# Patient Record
Sex: Female | Born: 1970
Health system: Southern US, Community
[De-identification: ages and names within clinical notes are randomized; demographics above are authoritative.]

## PROBLEM LIST (undated history)

## (undated) DIAGNOSIS — M1990 Unspecified osteoarthritis, unspecified site: Secondary | ICD-10-CM

## (undated) DIAGNOSIS — I10 Essential (primary) hypertension: Secondary | ICD-10-CM

## (undated) DIAGNOSIS — K219 Gastro-esophageal reflux disease without esophagitis: Secondary | ICD-10-CM

## (undated) DIAGNOSIS — J45909 Unspecified asthma, uncomplicated: Secondary | ICD-10-CM

## (undated) DIAGNOSIS — G5600 Carpal tunnel syndrome, unspecified upper limb: Secondary | ICD-10-CM

## (undated) DIAGNOSIS — E785 Hyperlipidemia, unspecified: Secondary | ICD-10-CM

## (undated) HISTORY — PX: BREAST SURGERY: SHX581

## (undated) HISTORY — PX: CARPAL TUNNEL RELEASE: SHX101

## (undated) HISTORY — PX: CHOLECYSTECTOMY: SHX55

## (undated) HISTORY — PX: TUBAL LIGATION: SHX77

## (undated) HISTORY — DX: Essential (primary) hypertension: I10

## (undated) HISTORY — DX: Unspecified osteoarthritis, unspecified site: M19.90

---
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
## 2018-04-07 ENCOUNTER — Emergency Department (HOSPITAL_COMMUNITY)
Admission: EM | Admit: 2018-04-07 | Discharge: 2018-04-07 | Disposition: A | Discharge status: Home / Self Care | Payer: Medicaid Other | Attending: Emergency Medicine | Admitting: Emergency Medicine

## 2018-04-07 ENCOUNTER — Emergency Department (HOSPITAL_COMMUNITY): Payer: Medicaid Other

## 2018-04-07 ENCOUNTER — Encounter (HOSPITAL_COMMUNITY): Payer: Self-pay | Admitting: Emergency Medicine

## 2018-04-07 DIAGNOSIS — J45909 Unspecified asthma, uncomplicated: Secondary | ICD-10-CM | POA: Diagnosis not present

## 2018-04-07 DIAGNOSIS — M545 Low back pain, unspecified: Secondary | ICD-10-CM

## 2018-04-07 DIAGNOSIS — Y998 Other external cause status: Secondary | ICD-10-CM | POA: Insufficient documentation

## 2018-04-07 DIAGNOSIS — Y9301 Activity, walking, marching and hiking: Secondary | ICD-10-CM | POA: Insufficient documentation

## 2018-04-07 DIAGNOSIS — Y92009 Unspecified place in unspecified non-institutional (private) residence as the place of occurrence of the external cause: Secondary | ICD-10-CM

## 2018-04-07 DIAGNOSIS — M79609 Pain in unspecified limb: Secondary | ICD-10-CM | POA: Insufficient documentation

## 2018-04-07 DIAGNOSIS — S8992XA Unspecified injury of left lower leg, initial encounter: Secondary | ICD-10-CM | POA: Diagnosis present

## 2018-04-07 DIAGNOSIS — Y92019 Unspecified place in single-family (private) house as the place of occurrence of the external cause: Secondary | ICD-10-CM | POA: Diagnosis not present

## 2018-04-07 DIAGNOSIS — F17210 Nicotine dependence, cigarettes, uncomplicated: Secondary | ICD-10-CM | POA: Insufficient documentation

## 2018-04-07 DIAGNOSIS — S8011XA Contusion of right lower leg, initial encounter: Secondary | ICD-10-CM | POA: Insufficient documentation

## 2018-04-07 DIAGNOSIS — S8002XA Contusion of left knee, initial encounter: Secondary | ICD-10-CM | POA: Diagnosis not present

## 2018-04-07 DIAGNOSIS — W19XXXA Unspecified fall, initial encounter: Secondary | ICD-10-CM | POA: Insufficient documentation

## 2018-04-07 HISTORY — DX: Carpal tunnel syndrome, unspecified upper limb: G56.00

## 2018-04-07 HISTORY — DX: Unspecified osteoarthritis, unspecified site: M19.90

## 2018-04-07 HISTORY — DX: Unspecified asthma, uncomplicated: J45.909

## 2018-04-07 NOTE — ED Provider Notes (Signed)
[facility] EMERGENCY DEPARTMENT Provider Note   CSN: [id number] Arrival date & time: 04/07/18  2133     History   Chief Complaint Chief Complaint  Patient presents with  . Fall    HPI Judy Baker is a 47 y.o. female.  Patient with mechanical fall yesterday. She reports her slippers caught on a step at home, with patient falling forward. She struck her knees on the floor, and caught herself with her hands and arms. She is complaining of generalized muscle aches of upper arms and her lower back. Pain in right shin and left knee. She did not hit her head or lose consciousness.   The history is provided by the patient. No language interpreter was used.  Fall  This is a new problem. The current episode started yesterday. The problem has been gradually worsening. She has tried acetaminophen for the symptoms. The treatment provided mild relief.    Past Medical History:  Diagnosis Date  . Arthritis   . Asthma   . Carpal tunnel syndrome     There are no active problems to display for this patient.   Past Surgical History:  Procedure Laterality Date  . CHOLECYSTECTOMY    . TUBAL LIGATION       OB History   None      Home Medications    Prior to Admission medications   Not on File    Family History No family history on file.  Social History Social History   Tobacco Use  . Smoking status: Current Every Day Smoker    Packs/day: 0.50    Types: Cigarettes  . Smokeless tobacco: Never Used  Substance Use Topics  . Alcohol use: Not Currently  . Drug use: Not Currently     Allergies   Penicillins and Sulfa antibiotics   Review of Systems Review of Systems  Musculoskeletal: Positive for arthralgias, back pain and myalgias.  Skin: Negative for wound.  All other systems reviewed and are negative.    Physical Exam Updated Vital Signs BP (!) 145/68 (BP Location: Right Arm)   Pulse 81   Temp 99.7 F (37.6 C)   Resp 20   Ht 5\' 7"   (1.702 m)   Wt (!) 142 kg   SpO2 94%   BMI 49.02 kg/m   Physical Exam  Constitutional: She is oriented to person, place, and time. She appears well-developed and well-nourished.  HENT:  Head: Atraumatic.  Eyes: Conjunctivae are normal.  Neck: Neck supple.  Cardiovascular: Normal rate and regular rhythm.  Pulmonary/Chest: Effort normal and breath sounds normal.  Abdominal: Soft. Bowel sounds are normal.  Musculoskeletal: Normal range of motion. She exhibits tenderness.       Right wrist: She exhibits tenderness. She exhibits normal range of motion, no bony tenderness, no swelling and no deformity.       Left wrist: She exhibits tenderness. She exhibits normal range of motion, no swelling and no deformity.       Left knee: Tenderness found.       Back:       Arms:      Legs: Neurological: She is alert and oriented to person, place, and time.  Skin: Skin is warm and dry.  Psychiatric: She has a normal mood and affect.  Nursing note and vitals reviewed.    ED Treatments / Results  Labs (all labs ordered are listed, but only abnormal results are displayed) Labs Reviewed - No data to display  EKG None  Radiology Dg Knee Complete 4 Views Left  Result Date: 04/07/2018 CLINICAL DATA:  Fall at home.  Medial left knee pain and bruising. EXAM: LEFT KNEE - COMPLETE 4+ VIEW COMPARISON:  None. FINDINGS: Osteoarthritis with loss of articular space especially in the medial compartment and patellofemoral joint, and tricompartmental spurring. Overall degree of osteoarthritis is moderate to prominent. Mild subcortical sclerosis in the medial compartment. No appreciable knee effusion or fracture. No acute bony findings are identified. IMPRESSION: 1. Moderate to prominent osteoarthritis of the knee. No definite knee effusion or fracture. If pain persists despite conservative therapy, MRI may be warranted for further characterization. Electronically Signed   By: Gaylyn Rong M.D.   On:  04/07/2018 22:44    Procedures Procedures (including critical care time)  Medications Ordered in ED Medications - No data to display   Initial Impression / Assessment and Plan / ED Course  I have reviewed the triage vital signs and the nursing notes.  Pertinent labs & imaging results that were available during my care of the patient were reviewed by me and considered in my medical decision making (see chart for details).     Patient X-Ray negative for obvious fracture or dislocation. Patient is aware of osteoarthritis findings. Patient given ace wrap while in ED, conservative therapy recommended and discussed.  Patient with muscular back pain.  No neurological deficits and normal neuro exam.  Patient is ambulatory.   No urinary symptoms suggestive of UTI.  Patient will be discharged home & is agreeable with above plan. Returns precautions discussed. Pt appears safe for discharge.   Final Clinical Impressions(s) / ED Diagnoses   Final diagnoses:  Fall in home, initial encounter  Contusion of left knee, initial encounter  Contusion of right lower leg, initial encounter  Low back pain at multiple sites  Pain in extremity at multiple sites    ED Discharge Orders    None       Felicie Morn, NP 04/07/18 1610    Linwood Dibbles, MD 04/08/18 [phone]

## 2018-04-07 NOTE — ED Notes (Signed)
Patient transported to X-ray 

## 2018-04-07 NOTE — Discharge Instructions (Addendum)
Take tylenol, 1000 mg (two extra strength, 500 mg tablets) along with 400-600 mg ibuprofen (2-3 200 mg tablets) every 8 hours for pain. Take with food. Follow-up with your primary care provider as scheduled next week.

## 2018-04-07 NOTE — ED Triage Notes (Signed)
Pt reports mechanical fall yesterday, reports she was wearing slippers and tripped over a step at home. No LOC. Reports gabapentin, tylenol and muscle relaxers not effective for pain

## 2018-04-07 NOTE — ED Notes (Addendum)
ED Provider at bedside. See EDP assessment.  

## 2018-04-07 NOTE — ED Notes (Signed)
Fall armband placed on patient.

## 2018-12-04 ENCOUNTER — Other Ambulatory Visit: Payer: Self-pay | Admitting: Family Medicine

## 2018-12-04 ENCOUNTER — Other Ambulatory Visit: Payer: Self-pay | Admitting: *Deleted

## 2018-12-04 DIAGNOSIS — N6310 Unspecified lump in the right breast, unspecified quadrant: Secondary | ICD-10-CM

## 2018-12-17 ENCOUNTER — Other Ambulatory Visit: Payer: Self-pay | Admitting: Family Medicine

## 2018-12-17 ENCOUNTER — Ambulatory Visit
Admission: RE | Admit: 2018-12-17 | Discharge: 2018-12-17 | Disposition: A | Discharge status: Home / Self Care | Payer: Medicaid Other | Source: Ambulatory Visit | Attending: Family Medicine | Admitting: Family Medicine

## 2018-12-17 ENCOUNTER — Other Ambulatory Visit: Payer: Self-pay

## 2018-12-17 DIAGNOSIS — N6310 Unspecified lump in the right breast, unspecified quadrant: Secondary | ICD-10-CM

## 2018-12-21 ENCOUNTER — Ambulatory Visit
Admission: RE | Admit: 2018-12-21 | Discharge: 2018-12-21 | Disposition: A | Discharge status: Home / Self Care | Payer: Medicaid Other | Source: Ambulatory Visit | Attending: Family Medicine | Admitting: Family Medicine

## 2018-12-21 ENCOUNTER — Other Ambulatory Visit: Payer: Self-pay | Admitting: Family Medicine

## 2018-12-21 ENCOUNTER — Other Ambulatory Visit: Payer: Self-pay

## 2018-12-21 DIAGNOSIS — N6320 Unspecified lump in the left breast, unspecified quadrant: Secondary | ICD-10-CM

## 2018-12-21 DIAGNOSIS — N6310 Unspecified lump in the right breast, unspecified quadrant: Secondary | ICD-10-CM

## 2018-12-21 HISTORY — PX: BREAST BIOPSY: SHX20

## 2018-12-23 ENCOUNTER — Other Ambulatory Visit: Payer: Medicaid Other

## 2018-12-23 ENCOUNTER — Other Ambulatory Visit: Payer: Self-pay | Admitting: Family Medicine

## 2018-12-23 DIAGNOSIS — R0602 Shortness of breath: Secondary | ICD-10-CM

## 2018-12-23 DIAGNOSIS — Z8249 Family history of ischemic heart disease and other diseases of the circulatory system: Secondary | ICD-10-CM

## 2018-12-24 ENCOUNTER — Other Ambulatory Visit: Payer: Medicaid Other

## 2019-01-13 ENCOUNTER — Other Ambulatory Visit: Payer: Self-pay | Admitting: *Deleted

## 2019-01-13 DIAGNOSIS — Z20822 Contact with and (suspected) exposure to covid-19: Secondary | ICD-10-CM

## 2019-01-18 LAB — NOVEL CORONAVIRUS, NAA: SARS-CoV-2, NAA: NOT DETECTED

## 2019-01-20 ENCOUNTER — Other Ambulatory Visit: Payer: Self-pay

## 2019-01-20 ENCOUNTER — Telehealth: Payer: Self-pay | Admitting: General Practice

## 2019-01-20 ENCOUNTER — Other Ambulatory Visit: Payer: Medicaid Other

## 2019-01-20 ENCOUNTER — Ambulatory Visit (INDEPENDENT_AMBULATORY_CARE_PROVIDER_SITE_OTHER): Payer: Medicaid Other

## 2019-01-20 DIAGNOSIS — R0602 Shortness of breath: Secondary | ICD-10-CM | POA: Diagnosis not present

## 2019-01-20 DIAGNOSIS — Z8249 Family history of ischemic heart disease and other diseases of the circulatory system: Secondary | ICD-10-CM | POA: Diagnosis not present

## 2019-01-20 NOTE — Telephone Encounter (Signed)
Negative result was given to pt °

## 2019-02-03 ENCOUNTER — Other Ambulatory Visit: Payer: Self-pay

## 2019-02-03 ENCOUNTER — Ambulatory Visit: Payer: Medicaid Other | Admitting: Cardiology

## 2019-02-04 ENCOUNTER — Encounter: Payer: Self-pay | Admitting: Cardiology

## 2019-02-04 DIAGNOSIS — K219 Gastro-esophageal reflux disease without esophagitis: Secondary | ICD-10-CM | POA: Insufficient documentation

## 2019-02-04 DIAGNOSIS — I10 Essential (primary) hypertension: Secondary | ICD-10-CM

## 2019-02-04 DIAGNOSIS — E785 Hyperlipidemia, unspecified: Secondary | ICD-10-CM

## 2019-02-04 HISTORY — DX: Gastro-esophageal reflux disease without esophagitis: K21.9

## 2019-02-04 HISTORY — DX: Hyperlipidemia, unspecified: E78.5

## 2019-02-04 HISTORY — DX: Essential (primary) hypertension: I10

## 2019-02-05 ENCOUNTER — Other Ambulatory Visit: Payer: Self-pay

## 2019-02-05 ENCOUNTER — Ambulatory Visit (INDEPENDENT_AMBULATORY_CARE_PROVIDER_SITE_OTHER): Payer: Medicaid Other | Admitting: Cardiology

## 2019-02-05 ENCOUNTER — Encounter: Payer: Self-pay | Admitting: Cardiology

## 2019-02-05 VITALS — BP 134/85 | HR 78 | Ht 67.0 in | Wt 343.0 lb

## 2019-02-05 DIAGNOSIS — I209 Angina pectoris, unspecified: Secondary | ICD-10-CM

## 2019-02-05 DIAGNOSIS — E7800 Pure hypercholesterolemia, unspecified: Secondary | ICD-10-CM | POA: Diagnosis not present

## 2019-02-05 DIAGNOSIS — I10 Essential (primary) hypertension: Secondary | ICD-10-CM | POA: Diagnosis not present

## 2019-02-05 DIAGNOSIS — F17200 Nicotine dependence, unspecified, uncomplicated: Secondary | ICD-10-CM

## 2019-02-05 DIAGNOSIS — K219 Gastro-esophageal reflux disease without esophagitis: Secondary | ICD-10-CM | POA: Diagnosis not present

## 2019-02-05 MED ORDER — VARENICLINE TARTRATE 0.5 MG PO TABS: 0.5000 mg | ORAL_TABLET | Freq: Two times a day (BID) | ORAL | 1 refills | Status: DC

## 2019-02-05 MED ORDER — NITROGLYCERIN 0.4 MG SL SUBL: 0.4000 mg | SUBLINGUAL_TABLET | SUBLINGUAL | 1 refills | Status: DC | PRN

## 2019-02-05 MED ORDER — METOPROLOL SUCCINATE ER 25 MG PO TB24: 25.0000 mg | ORAL_TABLET | Freq: Every day | ORAL | 1 refills | Status: DC

## 2019-02-05 MED ORDER — ASPIRIN EC 81 MG PO TBEC: 81.0000 mg | DELAYED_RELEASE_TABLET | Freq: Every day | ORAL | 3 refills | Status: DC

## 2019-02-05 NOTE — Progress Notes (Signed)
Primary Physician:  Adalberto Ill, DO   Patient ID: Judy Baker, female    DOB: 01-23-1971, 48 y.o.   MRN: [id number]  Subjective:    Chief Complaint  Patient presents with  . New Patient (Initial Visit)  . Chest Pain  . Shortness of Breath  . Results    Ultrasound    HPI: Judy Baker  is a 48 y.o. female  with HTN, HLD, obesity, OA, OSA on CPAP, recently underwent echocardiogram for family history of cardiomyopathy and was referred to [place] by Dr. Adalberto Ill for further evaluation.   She does report over the last few months, inability to perform normal activities such as household chores due to chest pain that radiates to her left arm and associated with shortness of breath. Improves with resting. She denies any PND or orthopnea. No chest pain at rest. No hemoptysis. Denies any leg edema.   She reports that hypertension and hyperlipidemia are well controlled. She is working to lose weight and has lost about 20 lbs over the last several months.  Brother was recently diagnosed with familial type of cardiomyopathy. She reports occasional alcoholic drink, but not daily. She is current smoker, but is hoping to quit soon. She previously took Chantix and would like to resume this.   Past Medical History:  Diagnosis Date  . Arthritis   . Asthma   . Carpal tunnel syndrome   . GERD (gastroesophageal reflux disease) 02/04/2019  . HLD (hyperlipidemia) 02/04/2019  . HTN (hypertension) 02/04/2019  . Hypertension   . Osteoarthritis     Past Surgical History:  Procedure Laterality Date  . BREAST SURGERY    . CARPAL TUNNEL RELEASE    . CHOLECYSTECTOMY    . TUBAL LIGATION      Social History   Socioeconomic History  . Marital status: Divorced    Spouse name: Not on file  . Number of children: 2  . Years of education: Not on file  . Highest education level: Not on file  Occupational History  . Not on file  Social Needs  . Financial resource strain: Not on file  . Food insecurity     Worry: Not on file    Inability: Not on file  . Transportation needs    Medical: Not on file    Non-medical: Not on file  Tobacco Use  . Smoking status: Current Every Day Smoker    Packs/day: 0.50    Years: 20.00    Pack years: 10.00    Types: Cigarettes  . Smokeless tobacco: Never Used  Substance and Sexual Activity  . Alcohol use: Yes    Comment: occas  . Drug use: Not Currently  . Sexual activity: Not on file  Lifestyle  . Physical activity    Days per week: Not on file    Minutes per session: Not on file  . Stress: Not on file  Relationships  . Social Herbalist on phone: Not on file    Gets together: Not on file    Attends religious service: Not on file    Active member of club or organization: Not on file    Attends meetings of clubs or organizations: Not on file    Relationship status: Not on file  . Intimate partner violence    Fear of current or ex partner: Not on file    Emotionally abused: Not on file    Physically abused: Not on file    Forced sexual activity:  Not on file  Other Topics Concern  . Not on file  Social History Narrative  . Not on file    Review of Systems  Constitution: Negative for decreased appetite, malaise/fatigue, weight gain and weight loss.  Eyes: Negative for visual disturbance.  Cardiovascular: Positive for chest pain and dyspnea on exertion. Negative for claudication, leg swelling, orthopnea, palpitations and syncope.  Respiratory: Negative for hemoptysis and wheezing.   Endocrine: Negative for cold intolerance and heat intolerance.  Hematologic/Lymphatic: Does not bruise/bleed easily.  Skin: Negative for nail changes.  Musculoskeletal: Negative for muscle weakness and myalgias.  Gastrointestinal: Negative for abdominal pain, change in bowel habit, nausea and vomiting.  Neurological: Negative for difficulty with concentration, dizziness, focal weakness and headaches.  Psychiatric/Behavioral: Negative for altered  mental status and suicidal ideas.  All other systems reviewed and are negative.     Objective:  Blood pressure 134/85, pulse 78, height 5\' 7"  (1.702 m), weight (!) 343 lb (155.6 kg), SpO2 99 %. Body mass index is 53.72 kg/m.    Physical Exam  Constitutional: She is oriented to person, place, and time. Vital signs are normal. She appears well-developed and well-nourished.  Morbidly obese  HENT:  Head: Normocephalic and atraumatic.  Neck: Normal range of motion.  Cardiovascular: Normal rate, regular rhythm, normal heart sounds and intact distal pulses.  Pulmonary/Chest: Effort normal and breath sounds normal. No accessory muscle usage. No respiratory distress.  Abdominal: Soft. Bowel sounds are normal.  Musculoskeletal: Normal range of motion.  Neurological: She is alert and oriented to person, place, and time.  Skin: Skin is warm and dry.  Vitals reviewed.  Radiology: No results found.  Laboratory examination:    No flowsheet data found. No flowsheet data found. Lipid Panel  No results found for: CHOL, TRIG, HDL, CHOLHDL, VLDL, LDLCALC, LDLDIRECT HEMOGLOBIN A1C No results found for: HGBA1C, MPG TSH No results for input(s): TSH in the last 8760 hours.  PRN Meds:. There are no discontinued medications. Current Meds  Medication Sig  . azelastine (ASTELIN) 0.1 % nasal spray Place 2 sprays into both nostrils 2 (two) times daily.  . busPIRone (BUSPAR) 10 MG tablet Take 10 mg by mouth 2 (two) times daily.  . DULoxetine (CYMBALTA) 60 MG capsule Take 60 mg by mouth daily.  . fluticasone (FLONASE) 50 MCG/ACT nasal spray USE 1 SPRAY IN each nostril TWICE DAILY AS NEEDED  . furosemide (LASIX) 20 MG tablet Take 20 mg by mouth as needed.  . gabapentin (NEURONTIN) 800 MG tablet Take 800 mg by mouth as needed.  Marland Kitchen. HYDROcodone-acetaminophen (NORCO) 10-325 MG tablet Take 1 tablet by mouth 2 (two) times daily as needed. for pain  . montelukast (SINGULAIR) 10 MG tablet Take 10 mg by mouth  at bedtime.  Marland Kitchen. omeprazole (PRILOSEC) 40 MG capsule Take 40 mg by mouth daily.  . potassium chloride SA (K-DUR) 20 MEQ tablet Take 20 mEq by mouth daily.   . pravastatin (PRAVACHOL) 40 MG tablet Take 40 mg by mouth daily.  . promethazine (PHENERGAN) 25 MG tablet Take 1 tablet by mouth as needed.  Marland Kitchen. spironolactone-hydrochlorothiazide (ALDACTAZIDE) 25-25 MG tablet Take 1 tablet by mouth daily.  . SYMBICORT 160-4.5 MCG/ACT inhaler Inhale 2 puffs into the lungs daily.   Marland Kitchen. tiZANidine (ZANAFLEX) 4 MG tablet Take 4 mg by mouth as needed.  . traMADol (ULTRAM) 50 MG tablet Take 50 mg by mouth as needed.  . traZODone (DESYREL) 100 MG tablet Take 100 mg by mouth at bedtime.  . vitamin  C (ASCORBIC ACID) 500 MG tablet Take 500 mg by mouth daily.    Cardiac Studies:   Echocardiogram 01/20/2019: Left ventricle cavity is normal in size. Mild concentric hypertrophy of the left ventricle. Normal LV systolic function with EF 57%. Normal global wall motion. Doppler evidence of grade I (impaired) diastolic dysfunction, normal LAP. Calculated EF 57%. Left atrial cavity is mildly dilated at 3.9 cm. Mild to moderate mitral regurgitation. Mild tricuspid regurgitation. Estimated pulmonary artery systolic pressure is 28 mmHg.  Assessment:     ICD-10-CM   1. Angina pectoris (HCC)  I20.9 EKG 12-Lead  2. Essential hypertension  I10   3. Gastroesophageal reflux disease, esophagitis presence not specified  K21.9   4. Pure hypercholesterolemia  E78.00   5. Tobacco use disorder  F17.200   6. Morbid obesity (HCC)  E66.01     EKG 02/05/2019: Normal sinus rhythm at 78 bpm, left atrial enlargement, normal axis, PRWP cannot exclude anterior infarct old. No evidence of ischemia.   Recommendations:   Discussed recently obtain echocardiogram, as normal LVEF with hypertension changes including mild LVH and grade 1 diastolic dysfunction.  She will need continued aggressive blood pressure management.  No evidence of  cardiomyopathy.  I've advised her to find out more details regarding her brother's diagnosis.  She does have concerning symptoms of angina along with several risk factors for CAD.  We'll schedule for Lexiscan nuclear stress testing.  I've encouraged her to start daily aspirin.  We'll also give her prescription for nitroglycerin to use as needed.  Asked her to take it easy until she can have further cardiac evaluation.  We'll also start her on metoprolol succinate for antianginal therapy.  EKG is without acute abnormalities.  She is willing to quit smoking, previously had success with Chantix.  I will give her prescription for this as well.  Encouraged her to continue to make diet modifications to help with weight loss.  I do not have recent labs from PCP, we will request for records.  She states that her cholesterol is well controlled but is unsure of last time this was checked.  I'll see her back after her stress test for further recommendations and reevaluation.   Toniann FailAshton Haynes , MSN, APRN, FNP-C [facility] [place] Office: [phone] Fax: [phone]

## 2019-02-10 ENCOUNTER — Encounter: Payer: Self-pay | Admitting: Cardiology

## 2019-02-17 ENCOUNTER — Ambulatory Visit: Payer: Medicaid Other | Admitting: Cardiology

## 2019-02-22 ENCOUNTER — Ambulatory Visit (INDEPENDENT_AMBULATORY_CARE_PROVIDER_SITE_OTHER): Payer: Medicaid Other

## 2019-02-22 ENCOUNTER — Other Ambulatory Visit: Payer: Self-pay

## 2019-02-22 DIAGNOSIS — I209 Angina pectoris, unspecified: Secondary | ICD-10-CM | POA: Diagnosis not present

## 2019-02-25 ENCOUNTER — Telehealth: Payer: Self-pay

## 2019-02-25 NOTE — Telephone Encounter (Signed)
Very small, mild area of decreased tracer uptake on stress imaging, possibly suggestive of ischemia. She also had some areas that were obscured from breast tissue. I will discuss in detail at her follow up. Nothing urgent

## 2019-02-25 NOTE — Telephone Encounter (Signed)
Pt called wanting to know the results of her recent test? Please let me know what to tell her

## 2019-02-26 NOTE — Telephone Encounter (Signed)
Pt aware of results and pending appt.//ah

## 2019-03-01 ENCOUNTER — Encounter (HOSPITAL_COMMUNITY): Payer: Self-pay | Admitting: *Deleted

## 2019-03-01 ENCOUNTER — Emergency Department (HOSPITAL_COMMUNITY)
Admission: EM | Admit: 2019-03-01 | Discharge: 2019-03-01 | Discharge status: Against Medical Advice | Payer: Medicaid Other | Attending: Emergency Medicine | Admitting: Emergency Medicine

## 2019-03-01 ENCOUNTER — Other Ambulatory Visit: Payer: Self-pay

## 2019-03-01 ENCOUNTER — Telehealth: Payer: Self-pay

## 2019-03-01 ENCOUNTER — Emergency Department (HOSPITAL_COMMUNITY): Payer: Medicaid Other

## 2019-03-01 DIAGNOSIS — R110 Nausea: Secondary | ICD-10-CM | POA: Diagnosis not present

## 2019-03-01 DIAGNOSIS — Z5321 Procedure and treatment not carried out due to patient leaving prior to being seen by health care provider: Secondary | ICD-10-CM | POA: Insufficient documentation

## 2019-03-01 DIAGNOSIS — R0789 Other chest pain: Secondary | ICD-10-CM | POA: Diagnosis present

## 2019-03-01 LAB — BASIC METABOLIC PANEL
Anion gap: 13 (ref 5–15)
BUN: 11 mg/dL (ref 6–20)
CO2: 24 mmol/L (ref 22–32)
Calcium: 9.6 mg/dL (ref 8.9–10.3)
Chloride: 99 mmol/L (ref 98–111)
Creatinine, Ser: 1.04 mg/dL — ABNORMAL HIGH (ref 0.44–1.00)
GFR calc Af Amer: >60 mL/min (ref >60)
GFR calc non Af Amer: >60 mL/min (ref >60)
Glucose, Bld: 73 mg/dL (ref 70–99)
Potassium: 3.4 mmol/L — ABNORMAL LOW (ref 3.5–5.1)
Sodium: 136 mmol/L (ref 135–145)

## 2019-03-01 LAB — CBC
HCT: 47.2 % — ABNORMAL HIGH (ref 36.0–46.0)
Hemoglobin: 15.5 g/dL — ABNORMAL HIGH (ref 12.0–15.0)
MCH: 30.8 pg (ref 26.0–34.0)
MCHC: 32.8 g/dL (ref 30.0–36.0)
MCV: 93.8 fL (ref 80.0–100.0)
Platelets: 268 10*3/uL (ref 150–400)
RBC: 5.03 MIL/uL (ref 3.87–5.11)
RDW: 13.2 % (ref 11.5–15.5)
WBC: 5.9 10*3/uL (ref 4.0–10.5)
nRBC: 0 % (ref 0.0–0.2)

## 2019-03-01 LAB — I-STAT BETA HCG BLOOD, ED (MC, WL, AP ONLY): I-stat hCG, quantitative: <5 m[IU]/mL (ref <5)

## 2019-03-01 LAB — TROPONIN I (HIGH SENSITIVITY)
Troponin I (High Sensitivity): 3 ng/L (ref <18)
Troponin I (High Sensitivity): 2 ng/L (ref <18)

## 2019-03-01 MED ORDER — SODIUM CHLORIDE 0.9% FLUSH: 3.0000 mL | Freq: Once | INTRAVENOUS | Status: DC

## 2019-03-01 NOTE — ED Notes (Signed)
Pt complaining of active chest pain and nausea. Pt taken back to triage to be reassesed.

## 2019-03-01 NOTE — ED Notes (Signed)
Pt informed this tech that she was nauseas. SORT RN informed.

## 2019-03-01 NOTE — ED Notes (Signed)
Pt called RN over to tell her that "you can take my name off the list, I'm going home.'

## 2019-03-01 NOTE — ED Triage Notes (Signed)
Pt was told to come up here for evaluation by her cardiologist due to CP which is a heaviness in the center of chest.  Pt sates that she has spasms in neck and left arm with this as well as headache.  Pt states that this has been intermittent since middle of last week but it felt like it was more yesterday and today.

## 2019-03-01 NOTE — Telephone Encounter (Signed)
Pt called at 4:49pm stating that she has been having chest tightness/heaviness with exertion since last week. She called ED and they recommended that she take nitro and go to ED if no improvement. She has taken 3 nitro since Thurs. She wants to know what she should do. Verbally discussed with AK and she suggested pt go to ED. Pt aware of instructions.//ah

## 2019-03-09 ENCOUNTER — Ambulatory Visit: Payer: Medicaid Other | Admitting: Cardiology

## 2019-03-09 NOTE — Progress Notes (Deleted)
Primary Physician:  Coralee RudScheer, James, DO   Patient ID: Judy OlszewskiAngela Baker, female    DOB: 05/23/1971, 48 y.o.   MRN: [id number]  Subjective:    No chief complaint on file.   HPI: Judy Baker  is a 48 y.o. female  with HTN, HLD, obesity, OA, OSA on CPAP, recently underwent echocardiogram for family history of cardiomyopathy and was referred to us by Dr. Coralee RudJames Scheer for further evaluation.   She does report over the last few months, inability to perform normal activities such as household chores due to chest pain that radiates to her left arm and associated with shortness of breath. Improves with resting. She denies any PND or orthopnea. No chest pain at rest. No hemoptysis. Denies any leg edema.   She reports that hypertension and hyperlipidemia are well controlled. She is working to lose weight and has lost about 20 lbs over the last several months.  Brother was recently diagnosed with familial type of cardiomyopathy. She reports occasional alcoholic drink, but not daily. She is current smoker, but is hoping to quit soon. She previously took Chantix and would like to resume this.   Past Medical History:  Diagnosis Date   Arthritis    Asthma    Carpal tunnel syndrome    GERD (gastroesophageal reflux disease) 02/04/2019   HLD (hyperlipidemia) 02/04/2019   HTN (hypertension) 02/04/2019   Hypertension    Osteoarthritis     Past Surgical History:  Procedure Laterality Date   BREAST SURGERY     CARPAL TUNNEL RELEASE     CHOLECYSTECTOMY     TUBAL LIGATION      Social History   Socioeconomic History   Marital status: Divorced    Spouse name: Not on file   Number of children: 2   Years of education: Not on file   Highest education level: Not on file  Occupational History   Not on file  Social Needs   Financial resource strain: Not on file   Food insecurity    Worry: Not on file    Inability: Not on file   Transportation needs    Medical: Not on file   Non-medical: Not on file  Tobacco Use   Smoking status: Current Every Day Smoker    Packs/day: 0.50    Years: 20.00    Pack years: 10.00    Types: Cigarettes   Smokeless tobacco: Never Used  Substance and Sexual Activity   Alcohol use: Yes    Comment: occas   Drug use: Not Currently   Sexual activity: Not on file  Lifestyle   Physical activity    Days per week: Not on file    Minutes per session: Not on file   Stress: Not on file  Relationships   Social connections    Talks on phone: Not on file    Gets together: Not on file    Attends religious service: Not on file    Active member of club or organization: Not on file    Attends meetings of clubs or organizations: Not on file    Relationship status: Not on file   Intimate partner violence    Fear of current or ex partner: Not on file    Emotionally abused: Not on file    Physically abused: Not on file    Forced sexual activity: Not on file  Other Topics Concern   Not on file  Social History Narrative   Not on file    Review of  Systems  Constitution: Negative for decreased appetite, malaise/fatigue, weight gain and weight loss.  Eyes: Negative for visual disturbance.  Cardiovascular: Positive for chest pain and dyspnea on exertion. Negative for claudication, leg swelling, orthopnea, palpitations and syncope.  Respiratory: Negative for hemoptysis and wheezing.   Endocrine: Negative for cold intolerance and heat intolerance.  Hematologic/Lymphatic: Does not bruise/bleed easily.  Skin: Negative for nail changes.  Musculoskeletal: Negative for muscle weakness and myalgias.  Gastrointestinal: Negative for abdominal pain, change in bowel habit, nausea and vomiting.  Neurological: Negative for difficulty with concentration, dizziness, focal weakness and headaches.  Psychiatric/Behavioral: Negative for altered mental status and suicidal ideas.  All other systems reviewed and are negative.     Objective:  There  were no vitals taken for this visit. There is no height or weight on file to calculate BMI.    Physical Exam  Constitutional: She is oriented to person, place, and time. Vital signs are normal. She appears well-developed and well-nourished.  Morbidly obese  HENT:  Head: Normocephalic and atraumatic.  Neck: Normal range of motion.  Cardiovascular: Normal rate, regular rhythm, normal heart sounds and intact distal pulses.  Pulmonary/Chest: Effort normal and breath sounds normal. No accessory muscle usage. No respiratory distress.  Abdominal: Soft. Bowel sounds are normal.  Musculoskeletal: Normal range of motion.  Neurological: She is alert and oriented to person, place, and time.  Skin: Skin is warm and dry.  Vitals reviewed.  Radiology: No results found.  Laboratory examination:    CMP Latest Ref Rng & Units 03/01/2019  Glucose 70 - 99 mg/dL 73  BUN 6 - 20 mg/dL 11  Creatinine 7.56 - 4.33 mg/dL 2.95(J)  Sodium 884 - 166 mmol/L 136  Potassium 3.5 - 5.1 mmol/L 3.4(L)  Chloride 98 - 111 mmol/L 99  CO2 22 - 32 mmol/L 24  Calcium 8.9 - 10.3 mg/dL 9.6   CBC Latest Ref Rng & Units 03/01/2019  WBC 4.0 - 10.5 K/uL 5.9  Hemoglobin 12.0 - 15.0 g/dL 15.5(H)  Hematocrit 36.0 - 46.0 % 47.2(H)  Platelets 150 - 400 K/uL 268   Lipid Panel  No results found for: CHOL, TRIG, HDL, CHOLHDL, VLDL, LDLCALC, LDLDIRECT HEMOGLOBIN A1C No results found for: HGBA1C, MPG TSH No results for input(s): TSH in the last 8760 hours.  PRN Meds:. There are no discontinued medications. No outpatient medications have been marked as taking for the 03/09/19 encounter (Appointment) with Toniann Fail, NP.    Cardiac Studies:   Echocardiogram 01/20/2019: Left ventricle cavity is normal in size. Mild concentric hypertrophy of the left ventricle. Normal LV systolic function with EF 57%. Normal global wall motion. Doppler evidence of grade I (impaired) diastolic dysfunction, normal LAP. Calculated EF  57%. Left atrial cavity is mildly dilated at 3.9 cm. Mild to moderate mitral regurgitation. Mild tricuspid regurgitation. Estimated pulmonary artery systolic pressure is 28 mmHg.  Assessment:   No diagnosis found.  EKG 02/05/2019: Normal sinus rhythm at 78 bpm, left atrial enlargement, normal axis, PRWP cannot exclude anterior infarct old. No evidence of ischemia.   Recommendations:   Discussed recently obtain echocardiogram, as normal LVEF with hypertension changes including mild LVH and grade 1 diastolic dysfunction.  She will need continued aggressive blood pressure management.  No evidence of cardiomyopathy.  I've advised her to find out more details regarding her brother's diagnosis.  She does have concerning symptoms of angina along with several risk factors for CAD.  We'll schedule for Lexiscan nuclear stress testing.  I've encouraged her  to start daily aspirin.  We'll also give her prescription for nitroglycerin to use as needed.  Asked her to take it easy until she can have further cardiac evaluation.  We'll also start her on metoprolol succinate for antianginal therapy.  EKG is without acute abnormalities.  She is willing to quit smoking, previously had success with Chantix.  I will give her prescription for this as well.  Encouraged her to continue to make diet modifications to help with weight loss.  I do not have recent labs from PCP, we will request for records.  She states that her cholesterol is well controlled but is unsure of last time this was checked.  I'll see her back after her stress test for further recommendations and reevaluation.   Miquel Dunn, MSN, APRN, FNP-C [facility] [place] Office: [phone] Fax: [phone]

## 2019-03-12 ENCOUNTER — Encounter: Payer: Self-pay | Admitting: Cardiology

## 2019-03-12 ENCOUNTER — Ambulatory Visit: Payer: Medicaid Other | Admitting: Cardiology

## 2019-03-12 ENCOUNTER — Other Ambulatory Visit: Payer: Self-pay

## 2019-03-12 VITALS — BP 142/86 | HR 85 | Ht 67.0 in | Wt 344.8 lb

## 2019-03-12 DIAGNOSIS — F17200 Nicotine dependence, unspecified, uncomplicated: Secondary | ICD-10-CM | POA: Diagnosis not present

## 2019-03-12 DIAGNOSIS — I10 Essential (primary) hypertension: Secondary | ICD-10-CM | POA: Diagnosis not present

## 2019-03-12 DIAGNOSIS — I209 Angina pectoris, unspecified: Secondary | ICD-10-CM | POA: Diagnosis not present

## 2019-03-12 MED ORDER — AMLODIPINE BESYLATE 5 MG PO TABS: 5.0000 mg | ORAL_TABLET | Freq: Every day | ORAL | 2 refills | Status: DC

## 2019-03-12 NOTE — Progress Notes (Signed)
Primary Physician:  Coralee RudScheer, James, DO   Patient ID: Judy Baker, female    DOB: 08-18-1970, 48 y.o.   MRN: [id number]  Subjective:    Chief Complaint  Patient presents with  . Chest Pain  . Results    nuc  . Follow-up    4wk    HPI: Judy Olszewskingela Constante  is a 48 y.o. female  with HTN, HLD, obesity, OA, OSA on CPAP, recently underwent echocardiogram for family history of cardiomyopathy. Echocardiogram was without evidence of cardiomyopathy. Due to concerning symptoms of angina, she underwent lexiscan nuclear stress test and now presents for follow up.  She was started on metoprolol at her last office visit and is tolerating this well.  She was also given a prescription for nitroglycerin.  She reports he did this several times with immediate relief of chest discomfort.  She continued to have concerning symptoms of angina with radiation to her jaw and left arm.  Has also noted increased sweating with activities.  During the interim, she was seen in the emergency room 8/31 for her chest pain.  Due to having wait for several hours, was not noted to be elevated.  She reports that hypertension and hyperlipidemia are well controlled. She is working to lose weight and has lost about 20 lbs over the last several months.  Brother was recently diagnosed with familial type of cardiomyopathy. She reports occasional alcoholic drink, but not daily. She is current smoker, but is hoping to quit soon. She was given Chantix at her last appt and states that she feels like this should be increased. She has been able to cut back to 4 cigarettes per day.   Past Medical History:  Diagnosis Date  . Arthritis   . Asthma   . Carpal tunnel syndrome   . GERD (gastroesophageal reflux disease) 02/04/2019  . HLD (hyperlipidemia) 02/04/2019  . HTN (hypertension) 02/04/2019  . Hypertension   . Osteoarthritis     Past Surgical History:  Procedure Laterality Date  . BREAST SURGERY    . CARPAL TUNNEL RELEASE    .  CHOLECYSTECTOMY    . TUBAL LIGATION      Social History   Socioeconomic History  . Marital status: Single    Spouse name: Not on file  . Number of children: 2  . Years of education: Not on file  . Highest education level: Not on file  Occupational History  . Not on file  Social Needs  . Financial resource strain: Not on file  . Food insecurity    Worry: Not on file    Inability: Not on file  . Transportation needs    Medical: Not on file    Non-medical: Not on file  Tobacco Use  . Smoking status: Current Every Day Smoker    Packs/day: 0.25    Years: 20.00    Pack years: 5.00    Types: Cigarettes  . Smokeless tobacco: Never Used  Substance and Sexual Activity  . Alcohol use: Yes    Comment: occas  . Drug use: Not Currently  . Sexual activity: Not on file  Lifestyle  . Physical activity    Days per week: Not on file    Minutes per session: Not on file  . Stress: Not on file  Relationships  . Social Musicianconnections    Talks on phone: Not on file    Gets together: Not on file    Attends religious service: Not on file    Active member  of club or organization: Not on file    Attends meetings of clubs or organizations: Not on file    Relationship status: Not on file  . Intimate partner violence    Fear of current or ex partner: Not on file    Emotionally abused: Not on file    Physically abused: Not on file    Forced sexual activity: Not on file  Other Topics Concern  . Not on file  Social History Narrative  . Not on file    Review of Systems  Constitution: Negative for decreased appetite, malaise/fatigue, weight gain and weight loss.  Eyes: Negative for visual disturbance.  Cardiovascular: Positive for chest pain and dyspnea on exertion. Negative for claudication, leg swelling, orthopnea, palpitations and syncope.  Respiratory: Negative for hemoptysis and wheezing.   Endocrine: Negative for cold intolerance and heat intolerance.  Hematologic/Lymphatic: Does not  bruise/bleed easily.  Skin: Negative for nail changes.  Musculoskeletal: Negative for muscle weakness and myalgias.  Gastrointestinal: Negative for abdominal pain, change in bowel habit, nausea and vomiting.  Neurological: Negative for difficulty with concentration, dizziness, focal weakness and headaches.  Psychiatric/Behavioral: Negative for altered mental status and suicidal ideas.  All other systems reviewed and are negative.     Objective:  Blood pressure (!) 142/86, pulse 85, height 5\' 7"  (1.702 m), weight (!) 344 lb 12.8 oz (156.4 kg), SpO2 96 %. Body mass index is 54 kg/m.    Physical Exam  Constitutional: She is oriented to person, place, and time. Vital signs are normal. She appears well-developed and well-nourished.  Morbidly obese  HENT:  Head: Normocephalic and atraumatic.  Neck: Normal range of motion.  Cardiovascular: Normal rate, regular rhythm, normal heart sounds and intact distal pulses.  Pulmonary/Chest: Effort normal and breath sounds normal. No accessory muscle usage. No respiratory distress.  Abdominal: Soft. Bowel sounds are normal.  Musculoskeletal: Normal range of motion.  Neurological: She is alert and oriented to person, place, and time.  Skin: Skin is warm and dry.  Vitals reviewed.  Radiology: No results found.  Laboratory examination:    CMP Latest Ref Rng & Units 03/01/2019  Glucose 70 - 99 mg/dL 73  BUN 6 - 20 mg/dL 11  Creatinine 8.25 - 0.03 mg/dL 7.04(U)  Sodium 889 - 169 mmol/L 136  Potassium 3.5 - 5.1 mmol/L 3.4(L)  Chloride 98 - 111 mmol/L 99  CO2 22 - 32 mmol/L 24  Calcium 8.9 - 10.3 mg/dL 9.6   CBC Latest Ref Rng & Units 03/01/2019  WBC 4.0 - 10.5 K/uL 5.9  Hemoglobin 12.0 - 15.0 g/dL 15.5(H)  Hematocrit 36.0 - 46.0 % 47.2(H)  Platelets 150 - 400 K/uL 268   Lipid Panel  No results found for: CHOL, TRIG, HDL, CHOLHDL, VLDL, LDLCALC, LDLDIRECT HEMOGLOBIN A1C No results found for: HGBA1C, MPG TSH No results for input(s): TSH in  the last 8760 hours.  PRN Meds:. There are no discontinued medications. Current Meds  Medication Sig  . albuterol (PROVENTIL) (2.5 MG/3ML) 0.083% nebulizer solution USE 3 MLS VIA NEBULIZER EVERY 20 MINUTES UP TO 3 TIMES AS NEEDED FOR WHEEZING OR SHORTNESS OF BREATH  . aspirin EC 81 MG tablet Take 1 tablet (81 mg total) by mouth daily.  Marland Kitchen azelastine (ASTELIN) 0.1 % nasal spray Place 2 sprays into both nostrils as needed.   . busPIRone (BUSPAR) 10 MG tablet Take 10 mg by mouth 2 (two) times daily.  . cholecalciferol (VITAMIN D3) 25 MCG (1000 UT) tablet Take 1,000 Units by  mouth daily.  . DULoxetine (CYMBALTA) 60 MG capsule Take 60 mg by mouth daily.  . fluticasone (FLONASE) 50 MCG/ACT nasal spray USE 1 SPRAY IN each nostril TWICE DAILY AS NEEDED  . furosemide (LASIX) 20 MG tablet Take 20 mg by mouth as needed.  . gabapentin (NEURONTIN) 800 MG tablet Take 800 mg by mouth as needed.  Marland Kitchen HYDROcodone-acetaminophen (NORCO) 10-325 MG tablet Take 1 tablet by mouth 2 (two) times daily as needed. for pain  . metoprolol succinate (TOPROL XL) 25 MG 24 hr tablet Take 1 tablet (25 mg total) by mouth daily.  . montelukast (SINGULAIR) 10 MG tablet Take 10 mg by mouth at bedtime.  . nitroGLYCERIN (NITROSTAT) 0.4 MG SL tablet Place 1 tablet (0.4 mg total) under the tongue every 5 (five) minutes as needed for chest pain.  . Omega-3 Fatty Acids (FISH OIL) 1000 MG CAPS Take by mouth.  Marland Kitchen omeprazole (PRILOSEC) 40 MG capsule Take 40 mg by mouth daily.  . potassium chloride SA (K-DUR) 20 MEQ tablet Take 20 mEq by mouth daily.   . pravastatin (PRAVACHOL) 40 MG tablet Take 40 mg by mouth daily.  . promethazine (PHENERGAN) 25 MG tablet Take 1 tablet by mouth as needed.  Marland Kitchen spironolactone-hydrochlorothiazide (ALDACTAZIDE) 25-25 MG tablet Take 1 tablet by mouth daily.  . SYMBICORT 160-4.5 MCG/ACT inhaler Inhale 2 puffs into the lungs daily.   Marland Kitchen tiZANidine (ZANAFLEX) 4 MG tablet Take 4 mg by mouth as needed.  . traMADol  (ULTRAM) 50 MG tablet Take 50 mg by mouth as needed.  . traZODone (DESYREL) 100 MG tablet Take 100 mg by mouth at bedtime.  . varenicline (CHANTIX) 0.5 MG tablet Take 1 tablet (0.5 mg total) by mouth 2 (two) times daily.  . vitamin C (ASCORBIC ACID) 500 MG tablet Take 500 mg by mouth daily.    Cardiac Studies:   Echocardiogram 01/20/2019: Left ventricle cavity is normal in size. Mild concentric hypertrophy of the left ventricle. Normal LV systolic function with EF 57%. Normal global wall motion. Doppler evidence of grade I (impaired) diastolic dysfunction, normal LAP. Calculated EF 57%. Left atrial cavity is mildly dilated at 3.9 cm. Mild to moderate mitral regurgitation. Mild tricuspid regurgitation. Estimated pulmonary artery systolic pressure is 28 mmHg.  Lexiscan Myoview stress test 02/22/2019: Lexiscan stress test was performed. Stress EKG is non-diagnostic, as this is pharmacological stress test. Normal myocardial thickening with LVEF 50%. SPECT stress and rest images reveal small size, mild intensity, decreased tracer uptake in apical myocardium. In addition, SPECT rest images reveal decreased tracer uptake of medium intensity in basal anteroseptal, inferoseptal myocardium. These findings are most likely due to breast attenuation in a morbidly obese patient. Clinical correlation recommended.  Intermediate risk study.   Assessment:     ICD-10-CM   1. Angina pectoris (HCC)  I20.9   2. Essential hypertension  I10   3. Tobacco use disorder  F17.200   4. Morbid obesity ([place])  E66.01     EKG 02/05/2019: Normal sinus rhythm at 78 bpm, left atrial enlargement, normal axis, PRWP cannot exclude anterior infarct old. No evidence of ischemia.   Recommendations:   I discussed recently obtained Lexiscan nuclear stress test results, considered intermediate risk study in view of decreased tracer uptake apical and inferoseptal myocardium, potentially related to breast tissue attenuation.  She  continues to have concerning symptoms of angina that is nitroglycerin responsive.  She has several risk factors for CAD, I discussed options for further evaluation including coronary CTA versus coronary  angiogram.  She prefers to proceed with coronary angiogram. Schedule for cardiac catheterization, and possible angioplasty. We discussed regarding risks, benefits, alternatives to this including stress testing, CTA and continued medical therapy. Patient wants to proceed. Understands <1-2% risk of death, stroke, MI, urgent CABG, bleeding, infection, renal failure but not limited to these.  We will continue with metoprolol for antianginal therapy. Blood pressure continues to be elevated, will add Amlodipine 5 mg daily both for hypertension and chest pain.  Encouraged her to continue to use nitroglycerin as needed.  She is currently taking aspirin daily, advised continue the same.  Advised her to limit her activities until she can have further work-up.  She will continue to need aggressive risk factor modification particularly with weight loss.  Discussed diet modification and regular exercise once she has had further cardiac evaluation to help with this.  She is tolerating Chantix well, but feels that she needs stronger dose.  She has been able to cut back to 5 cigarettes/day congratulated her on this.  Will increase Chantix to 1 mg twice a day.  We will see her back after the procedure for further recommendations and reevaluation.   *I have discussed this case with Dr. Jacinto HalimGanji and he participated in formulating the plan.*   Toniann FailAshton Haynes Paula Zietz, MSN, APRN, FNP-C [facility] Office: [phone] Fax: [phone]

## 2019-03-17 ENCOUNTER — Telehealth: Payer: Self-pay

## 2019-03-17 DIAGNOSIS — M7989 Other specified soft tissue disorders: Secondary | ICD-10-CM

## 2019-03-17 NOTE — Telephone Encounter (Signed)
Spoke with patient she is having swelling in the right leg. I have scheduled her for venous duplex 03/18/19 at 10 am

## 2019-03-17 NOTE — Telephone Encounter (Signed)
Patient called complains of fatigue also,heaviness, tingling and pain in right leg x 2 days. She is propping the leg up but gets no relief. She is concerned that she may have a blood clot. She can be reached at [phone]. Please advise.

## 2019-03-17 NOTE — Telephone Encounter (Signed)
Thank you :)

## 2019-03-18 ENCOUNTER — Other Ambulatory Visit: Payer: Self-pay | Admitting: Cardiology

## 2019-03-18 ENCOUNTER — Other Ambulatory Visit: Payer: Medicaid Other

## 2019-03-18 DIAGNOSIS — I209 Angina pectoris, unspecified: Secondary | ICD-10-CM

## 2019-03-18 NOTE — Progress Notes (Signed)
Labs for cath entered

## 2019-03-18 NOTE — H&P (View-Only) (Signed)
Labs for cath entered 

## 2019-03-19 LAB — BASIC METABOLIC PANEL
BUN/Creatinine Ratio: 17 (ref 9–23)
BUN: 15 mg/dL (ref 6–24)
CO2: 26 mmol/L (ref 20–29)
Calcium: 9.5 mg/dL (ref 8.7–10.2)
Chloride: 101 mmol/L (ref 96–106)
Creatinine, Ser: 0.87 mg/dL (ref 0.57–1.00)
GFR calc Af Amer: 90 mL/min/{1.73_m2} (ref >59)
GFR calc non Af Amer: 78 mL/min/{1.73_m2} (ref >59)
Glucose: 98 mg/dL (ref 65–99)
Potassium: 3.9 mmol/L (ref 3.5–5.2)
Sodium: 140 mmol/L (ref 134–144)

## 2019-03-19 LAB — CBC
Hematocrit: 45.2 % (ref 34.0–46.6)
Hemoglobin: 14.7 g/dL (ref 11.1–15.9)
MCH: 29.8 pg (ref 26.6–33.0)
MCHC: 32.5 g/dL (ref 31.5–35.7)
MCV: 92 fL (ref 79–97)
Platelets: 262 10*3/uL (ref 150–450)
RBC: 4.93 x10E6/uL (ref 3.77–5.28)
RDW: 12.8 % (ref 11.7–15.4)
WBC: 5.4 10*3/uL (ref 3.4–10.8)

## 2019-03-22 ENCOUNTER — Ambulatory Visit (INDEPENDENT_AMBULATORY_CARE_PROVIDER_SITE_OTHER): Payer: Medicaid Other

## 2019-03-22 ENCOUNTER — Other Ambulatory Visit: Payer: Self-pay

## 2019-03-22 DIAGNOSIS — M7989 Other specified soft tissue disorders: Secondary | ICD-10-CM

## 2019-03-23 NOTE — Progress Notes (Signed)
Called pt to inform her about her DVT. Pt understood.

## 2019-03-26 ENCOUNTER — Other Ambulatory Visit (HOSPITAL_COMMUNITY)
Admission: RE | Admit: 2019-03-26 | Discharge: 2019-03-26 | Disposition: A | Discharge status: Home / Self Care | Payer: Medicaid Other | Source: Ambulatory Visit | Attending: Cardiology | Admitting: Cardiology

## 2019-03-26 DIAGNOSIS — Z20828 Contact with and (suspected) exposure to other viral communicable diseases: Secondary | ICD-10-CM | POA: Diagnosis present

## 2019-03-27 LAB — NOVEL CORONAVIRUS, NAA (HOSP ORDER, SEND-OUT TO REF LAB; TAT 18-24 HRS): SARS-CoV-2, NAA: NOT DETECTED

## 2019-03-29 ENCOUNTER — Other Ambulatory Visit: Payer: Medicaid Other

## 2019-03-30 ENCOUNTER — Other Ambulatory Visit: Payer: Self-pay

## 2019-03-30 ENCOUNTER — Ambulatory Visit (HOSPITAL_COMMUNITY)
Admission: RE | Disposition: A | Discharge status: Home / Self Care | Payer: Self-pay | Source: Home / Self Care | Attending: Cardiology

## 2019-03-30 ENCOUNTER — Ambulatory Visit (HOSPITAL_COMMUNITY)
Admission: RE | Admit: 2019-03-30 | Discharge: 2019-03-30 | Disposition: A | Discharge status: Home / Self Care | Payer: Medicaid Other | Attending: Cardiology | Admitting: Cardiology

## 2019-03-30 DIAGNOSIS — Z7982 Long term (current) use of aspirin: Secondary | ICD-10-CM | POA: Insufficient documentation

## 2019-03-30 DIAGNOSIS — Z6841 Body Mass Index (BMI) 40.0 and over, adult: Secondary | ICD-10-CM | POA: Insufficient documentation

## 2019-03-30 DIAGNOSIS — E785 Hyperlipidemia, unspecified: Secondary | ICD-10-CM | POA: Insufficient documentation

## 2019-03-30 DIAGNOSIS — I209 Angina pectoris, unspecified: Secondary | ICD-10-CM | POA: Diagnosis not present

## 2019-03-30 DIAGNOSIS — G5600 Carpal tunnel syndrome, unspecified upper limb: Secondary | ICD-10-CM | POA: Diagnosis not present

## 2019-03-30 DIAGNOSIS — J45909 Unspecified asthma, uncomplicated: Secondary | ICD-10-CM | POA: Insufficient documentation

## 2019-03-30 DIAGNOSIS — Z79899 Other long term (current) drug therapy: Secondary | ICD-10-CM | POA: Insufficient documentation

## 2019-03-30 DIAGNOSIS — F17210 Nicotine dependence, cigarettes, uncomplicated: Secondary | ICD-10-CM | POA: Diagnosis not present

## 2019-03-30 DIAGNOSIS — M1990 Unspecified osteoarthritis, unspecified site: Secondary | ICD-10-CM | POA: Insufficient documentation

## 2019-03-30 DIAGNOSIS — K219 Gastro-esophageal reflux disease without esophagitis: Secondary | ICD-10-CM | POA: Diagnosis not present

## 2019-03-30 DIAGNOSIS — I10 Essential (primary) hypertension: Secondary | ICD-10-CM | POA: Diagnosis not present

## 2019-03-30 DIAGNOSIS — G4733 Obstructive sleep apnea (adult) (pediatric): Secondary | ICD-10-CM | POA: Insufficient documentation

## 2019-03-30 DIAGNOSIS — I200 Unstable angina: Secondary | ICD-10-CM | POA: Diagnosis present

## 2019-03-30 HISTORY — PX: LEFT HEART CATH AND CORONARY ANGIOGRAPHY: CATH118249

## 2019-03-30 LAB — PREGNANCY, URINE: Preg Test, Ur: NEGATIVE

## 2019-03-30 SURGERY — LEFT HEART CATH AND CORONARY ANGIOGRAPHY
Anesthesia: LOCAL

## 2019-03-30 MED ORDER — MIDAZOLAM HCL 2 MG/2ML IJ SOLN
INTRAMUSCULAR | Status: AC
  Filled 2019-03-30: qty 2

## 2019-03-30 MED ORDER — SODIUM CHLORIDE 0.9% FLUSH: 3.0000 mL | INTRAVENOUS | Status: DC | PRN

## 2019-03-30 MED ORDER — NITROGLYCERIN 1 MG/10 ML FOR IR/CATH LAB
INTRA_ARTERIAL | Status: AC
  Filled 2019-03-30: qty 10

## 2019-03-30 MED ORDER — FENTANYL CITRATE (PF) 100 MCG/2ML IJ SOLN
INTRAMUSCULAR | Status: AC
  Filled 2019-03-30: qty 2

## 2019-03-30 MED ORDER — LIDOCAINE HCL (PF) 1 % IJ SOLN
INTRAMUSCULAR | Status: DC | PRN
  Administered 2019-03-30: 2 mL

## 2019-03-30 MED ORDER — SODIUM CHLORIDE 0.9 % WEIGHT BASED INFUSION: 1.0000 mL/kg/h | INTRAVENOUS | Status: DC

## 2019-03-30 MED ORDER — SODIUM CHLORIDE 0.9 % WEIGHT BASED INFUSION: 3.0000 mL/kg/h | INTRAVENOUS | Status: DC

## 2019-03-30 MED ORDER — HEPARIN SODIUM (PORCINE) 1000 UNIT/ML IJ SOLN
INTRAMUSCULAR | Status: AC
  Filled 2019-03-30: qty 1

## 2019-03-30 MED ORDER — SODIUM CHLORIDE 0.9% FLUSH: 3.0000 mL | Freq: Two times a day (BID) | INTRAVENOUS | Status: DC

## 2019-03-30 MED ORDER — MIDAZOLAM HCL 2 MG/2ML IJ SOLN
INTRAMUSCULAR | Status: DC | PRN
  Administered 2019-03-30: 2 mg via INTRAVENOUS

## 2019-03-30 MED ORDER — ASPIRIN 81 MG PO CHEW: 81.0000 mg | CHEWABLE_TABLET | ORAL | Status: DC

## 2019-03-30 MED ORDER — SODIUM CHLORIDE 0.9 % WEIGHT BASED INFUSION
3.0000 mL/kg/h | INTRAVENOUS | Status: AC
  Administered 2019-03-30: 3 mL/kg/h via INTRAVENOUS

## 2019-03-30 MED ORDER — HEPARIN SODIUM (PORCINE) 1000 UNIT/ML IJ SOLN
INTRAMUSCULAR | Status: DC | PRN
  Administered 2019-03-30: 3000 [IU] via INTRAVENOUS

## 2019-03-30 MED ORDER — HEPARIN (PORCINE) IN NACL 1000-0.9 UT/500ML-% IV SOLN
INTRAVENOUS | Status: AC
  Filled 2019-03-30: qty 1000

## 2019-03-30 MED ORDER — SODIUM CHLORIDE 0.9 % IV SOLN: 250.0000 mL | INTRAVENOUS | Status: DC | PRN

## 2019-03-30 MED ORDER — VERAPAMIL HCL 2.5 MG/ML IV SOLN
INTRAVENOUS | Status: AC
  Filled 2019-03-30: qty 2

## 2019-03-30 MED ORDER — IOHEXOL 350 MG/ML SOLN
INTRAVENOUS | Status: DC | PRN
  Administered 2019-03-30: 50 mL

## 2019-03-30 MED ORDER — ONDANSETRON HCL 4 MG/2ML IJ SOLN: 4.0000 mg | Freq: Four times a day (QID) | INTRAMUSCULAR | Status: DC | PRN

## 2019-03-30 MED ORDER — FENTANYL CITRATE (PF) 100 MCG/2ML IJ SOLN
INTRAMUSCULAR | Status: DC | PRN
  Administered 2019-03-30: 25 ug via INTRAVENOUS

## 2019-03-30 MED ORDER — HEPARIN (PORCINE) IN NACL 1000-0.9 UT/500ML-% IV SOLN
INTRAVENOUS | Status: DC | PRN
  Administered 2019-03-30 (×2): 500 mL

## 2019-03-30 MED ORDER — LIDOCAINE HCL (PF) 1 % IJ SOLN
INTRAMUSCULAR | Status: AC
  Filled 2019-03-30: qty 30

## 2019-03-30 MED ORDER — ACETAMINOPHEN 325 MG PO TABS: 650.0000 mg | ORAL_TABLET | ORAL | Status: DC | PRN

## 2019-03-30 MED ORDER — NITROGLYCERIN 0.4 MG SL SUBL
SUBLINGUAL_TABLET | SUBLINGUAL | Status: AC
  Administered 2019-03-30: 11:00:00
  Administered 2019-03-30: 0.4 mg via SUBLINGUAL
  Filled 2019-03-30: qty 1

## 2019-03-30 SURGICAL SUPPLY — 11 items
CATH OPTITORQUE TIG 4.0 5F (CATHETERS) ×2 IMPLANT
DEVICE RAD COMP TR BAND LRG (VASCULAR PRODUCTS) ×2 IMPLANT
GLIDESHEATH SLEND A-KIT 6F 22G (SHEATH) ×2 IMPLANT
GUIDEWIRE INQWIRE 1.5J.035X260 (WIRE) ×1 IMPLANT
INQWIRE 1.5J .035X260CM (WIRE) ×2
KIT HEART LEFT (KITS) ×2 IMPLANT
PACK CARDIAC CATHETERIZATION (CUSTOM PROCEDURE TRAY) ×2 IMPLANT
SHEATH PROBE COVER 6X72 (BAG) ×2 IMPLANT
TRANSDUCER W/STOPCOCK (MISCELLANEOUS) ×2 IMPLANT
TUBING CIL FLEX 10 FLL-RA (TUBING) ×4 IMPLANT
VALVE MANIFOLD 3 PORT W/RA/ON (MISCELLANEOUS) ×2 IMPLANT

## 2019-03-30 NOTE — Interval H&P Note (Signed)
History and Physical Interval Note:  03/30/2019 12:47 PM  Judy Baker  has presented today for surgery, with the diagnosis of Angina.  The various methods of treatment have been discussed with the patient and family. After consideration of risks, benefits and other options for treatment, the patient has consented to  Procedure(s): LEFT HEART CATH AND CORONARY ANGIOGRAPHY (N/A) and possible angioplasty as a surgical intervention.  The patient's history has been reviewed, patient examined, no change in status, stable for surgery.  I have reviewed the patient's chart and labs.  Questions were answered to the patient's satisfaction.   Symptom Status: Ischemic Symptoms Non-invasive Testing: Intermediate Risk If no or indeterminate stress test, FFR/iFR results in all diseased vessels: N/A Diabetes Mellitus: No S/P CABG: No Antianginal therapy (number of long-acting drugs): >=2 Patient undergoing renal transplant: No Patient undergoing percutaneous valve procedure: No   1 Vessel Disease No proximal LAD involvement, No proximal left dominant LCX involvement  PCI: A (8);  Indication 2  CABG: M (6);  Indication 2 Proximal left dominant LCX involvement  PCI: A (8);  Indication 5  CABG: A (8);  Indication 5 Proximal LAD involvement  PCI: A (8);  Indication 5  CABG: A (8);  Indication 5  2 Vessel Disease No proximal LAD involvement  PCI: A (8);  Indication 8  CABG: A (7);  Indication 8 Proximal LAD involvement  PCI: A (8);  Indication 11  CABG: A (8);  Indication 11  3 Vessel Disease Low disease complexity (e.g., focal stenoses, SYNTAX <=22)  PCI: A (8);  Indication 17  CABG: A (8);  Indication 17 Intermediate or high disease complexity (e.g., SYNTAX >=23)  PCI: M (6);  Indication 21  CABG: A (9);  Indication 21  Left Main Disease Isolated LMCA disease: ostial or midshaft  PCI: A (7);  Indication 24  CABG: A (9);  Indication 24 Isolated LMCA disease: bifurcation involvement  PCI:  M (6);  Indication 25  CABG: A (9);  Indication 25 LMCA ostial or midshaft, concurrent low disease burden multivessel disease (e.g., 1-2 additional focal stenoses, SYNTAX <=22)  PCI: A (7);  Indication 26  CABG: A (9);  Indication 26 LMCA ostial or midshaft, concurrent intermediate or high disease burden multivessel disease (e.g., 1-2 additional bifurcation stenoses, long stenoses, SYNTAX >=23)  PCI: M (4);  Indication 27  CABG: A (9);  Indication 27 LMCA bifurcation involvement, concurrent low disease burden multivessel disease (e.g., 1-2 additional focal stenoses, SYNTAX <=22)  PCI: M (6);  Indication 28  CABG: A (9);  Indication 28 LMCA bifurcation involvement, concurrent intermediate or high disease burden multivessel disease (e.g., 1-2 additional bifurcation stenoses, long stenoses, SYNTAX >=23)  PCI: R (3);  Indication 29  CABG: A (9);  Indication 29  Notes:  A indicates appropriate. M indicates may be appropriate. R indicates rarely appropriate. Number in parentheses is median score for that indication. Reclassify indicates number of functionally diseased vessels should be decreased given negative FFR/iFR. Re-evaluate the scenario interpreting any FFR/iFR negative vessel as being not significantly stenosed.  Disease means involved vessel provides flow to a sufficient amount of myocardium to be clinically important.  If FFR testing indicates a vessel is not significant, that vessel should not be considered diseased (and the patient should be reclassified with respect to extent of functionally significant disease).  Proximal LAD + proximal left dominant LCX is considered 3 vessel CAD  2 Vessel CAD with FFR/iFR abnormal in only 1 but not both is considered  1 vessel CAD  Disease complexity includes occlusion, bifurcation, trifurcation, ostial, >20 mm, tortuosity, calcification, thrombus  LMCA disease is >=50% by angiography, MLD <2.8 mm, MLA <6 mm2; MLA 6-7.5 mm2 requires further  physiologic  See Table B for risk stratification based on noninvasive testing  Journal of the [employer] of Cardiology Mar 2017, 23391; DOI: 10.1016/j.jacc.2017.02.001 [email or web address].full-text.pdf This App  2018 by the Society for Cardiovascular Angiography and Interventions   Yates Decamp

## 2019-03-30 NOTE — Progress Notes (Addendum)
1110-  patient c/ o  Chest tightness .pain left shoulder,  Chest ,and arm.  Also c/ o pain left calve.  Patient placed on 2 liters O2, EKG done, NTG given per protocol. (1100- 8/10 1 NTG,  1105 5/10 1NTG, 1110 4/10 1 NTG).  1115- patient states pain to left chest is 2, leg 5.  Message sent to [facility].

## 2019-03-30 NOTE — Progress Notes (Signed)
Patient asleep.  Asked how her pain was and she stated was better, not completely gone but better.

## 2019-03-30 NOTE — Discharge Instructions (Signed)
KEEP RIGHT ARM ELEVATED ABOVE LEVEL OF HEART   Moderate Conscious Sedation, Adult, Care After  These instructions provide you with information about caring for yourself after your procedure. Your health care provider may also give you more specific instructions. Your treatment has been planned according to current medical practices, but problems sometimes occur. Call your health care provider if you have any problems or questions after your procedure. What can I expect after the procedure? After your procedure, it is common:  To feel sleepy for several hours.  To feel clumsy and have poor balance for several hours.  To have poor judgment for several hours.  To vomit if you eat too soon. Follow these instructions at home: For at least 24 hours after the procedure:   Do not: ? Participate in activities where you could fall or become injured. ? Drive. ? Use heavy machinery. ? Drink alcohol. ? Take sleeping pills or medicines that cause drowsiness. ? Make important decisions or sign legal documents. ? Take care of children on your own.  Rest. Eating and drinking  Follow the diet recommended by your health care provider.  If you vomit: ? Drink water, juice, or soup when you can drink without vomiting. ? Make sure you have little or no nausea before eating solid foods. General instructions  Have a responsible adult stay with you until you are awake and alert.  Take over-the-counter and prescription medicines only as told by your health care provider.  If you smoke, do not smoke without supervision.  Keep all follow-up visits as told by your health care provider. This is important. Contact a health care provider if:  You keep feeling nauseous or you keep vomiting.  You feel light-headed.  You develop a rash.  You have a fever. Get help right away if:  You have trouble breathing. This information is not intended to replace advice given to you by your health care  provider. Make sure you discuss any questions you have with your health care provider. Document Released: 04/07/2013 Document Revised: 05/30/2017 Document Reviewed: 10/07/2015 Elsevier Patient Education  2020 [place]  This sheet gives you information about how to care for yourself after your procedure. Your health care provider may also give you more specific instructions. If you have problems or questions, contact your health care provider. What can I expect after the procedure? After the procedure, it is common to have:  Bruising and tenderness at the catheter insertion area. Follow these instructions at home: Medicines  Take over-the-counter and prescription medicines only as told by your health care provider. Insertion site care  Follow instructions from your health care provider about how to take care of your insertion site. Make sure you: ? Wash your hands with soap and water before you change your bandage (dressing). If soap and water are not available, use hand sanitizer. ? Change your dressing as told by your health care provider. ? Leave stitches (sutures), skin glue, or adhesive strips in place. These skin closures may need to stay in place for 2 weeks or longer. If adhesive strip edges start to loosen and curl up, you may trim the loose edges. Do not remove adhesive strips completely unless your health care provider tells you to do that.  Check your insertion site every day for signs of infection. Check for: ? Redness, swelling, or pain. ? Fluid or blood. ? Pus or a bad smell. ? Warmth.  Do not take baths, swim, or use  a hot tub until your health care provider approves.  You may shower 24-48 hours after the procedure, or as directed by your health care provider. ? Remove the dressing and gently wash the site with plain soap and water. ? Pat the area dry with a clean towel. ? Do not rub the site. That could cause bleeding.  Do not apply powder or  lotion to the site. Activity   For 24 hours after the procedure, or as directed by your health care provider: ? Do not flex or bend the affected arm. ? Do not push or pull heavy objects with the affected arm. ? Do not drive yourself home from the hospital or clinic. You may drive 24 hours after the procedure unless your health care provider tells you not to. ? Do not operate machinery or power tools.  Do not lift anything that is heavier than 10 lb (4.5 kg), or the limit that you are told, until your health care provider says that it is safe.  Ask your health care provider when it is okay to: ? Return to work or school. ? Resume usual physical activities or sports. ? Resume sexual activity. General instructions  If the catheter site starts to bleed, raise your arm and put firm pressure on the site. If the bleeding does not stop, get help right away. This is a medical emergency.  If you went home on the same day as your procedure, a responsible adult should be with you for the first 24 hours after you arrive home.  Keep all follow-up visits as told by your health care provider. This is important. Contact a health care provider if:  You have a fever.  You have redness, swelling, or yellow drainage around your insertion site. Get help right away if:  You have unusual pain at the radial site.  The catheter insertion area swells very fast.  The insertion area is bleeding, and the bleeding does not stop when you hold steady pressure on the area.  Your arm or hand becomes pale, cool, tingly, or numb. These symptoms may represent a serious problem that is an emergency. Do not wait to see if the symptoms will go away. Get medical help right away. Call your local emergency services (911 in the [place] Do not drive yourself to the hospital. Summary  After the procedure, it is common to have bruising and tenderness at the site.  Follow instructions from your health care provider about  how to take care of your radial site wound. Check the wound every day for signs of infection.  Do not lift anything that is heavier than 10 lb (4.5 kg), or the limit that you are told, until your health care provider says that it is safe. This information is not intended to replace advice given to you by your health care provider. Make sure you discuss any questions you have with your health care provider. Document Released: 07/20/2010 Document Revised: 07/23/2017 Document Reviewed: 07/23/2017 Elsevier Patient Education  2020 [employer].

## 2019-03-31 ENCOUNTER — Encounter (HOSPITAL_COMMUNITY): Payer: Self-pay | Admitting: Cardiology

## 2019-03-31 MED FILL — Nitroglycerin IV Soln 100 MCG/ML in D5W: INTRA_ARTERIAL | Qty: 10 | Status: AC

## 2019-03-31 MED FILL — Verapamil HCl IV Soln 2.5 MG/ML: INTRAVENOUS | Qty: 2 | Status: AC

## 2019-04-01 ENCOUNTER — Encounter (HOSPITAL_COMMUNITY): Payer: Self-pay | Admitting: Cardiology

## 2019-04-07 ENCOUNTER — Ambulatory Visit (INDEPENDENT_AMBULATORY_CARE_PROVIDER_SITE_OTHER): Payer: Medicaid Other | Admitting: Cardiology

## 2019-04-07 ENCOUNTER — Other Ambulatory Visit: Payer: Self-pay

## 2019-04-07 ENCOUNTER — Encounter: Payer: Self-pay | Admitting: Cardiology

## 2019-04-07 VITALS — BP 159/84 | HR 66 | Temp 97.7°F | Ht 68.0 in | Wt 343.8 lb

## 2019-04-07 DIAGNOSIS — I209 Angina pectoris, unspecified: Secondary | ICD-10-CM

## 2019-04-07 DIAGNOSIS — I10 Essential (primary) hypertension: Secondary | ICD-10-CM

## 2019-04-07 DIAGNOSIS — F17200 Nicotine dependence, unspecified, uncomplicated: Secondary | ICD-10-CM | POA: Diagnosis not present

## 2019-04-07 MED ORDER — METOPROLOL SUCCINATE ER 50 MG PO TB24: 50.0000 mg | ORAL_TABLET | Freq: Every day | ORAL | 2 refills | Status: DC

## 2019-04-07 MED ORDER — PANTOPRAZOLE SODIUM 40 MG PO TBEC: 40.0000 mg | DELAYED_RELEASE_TABLET | Freq: Two times a day (BID) | ORAL | 1 refills | Status: DC

## 2019-04-07 NOTE — Progress Notes (Signed)
Primary Physician:  Coralee Rud, DO   Patient ID: Judy Baker, female    DOB: May 14, 1971, 48 y.o.   MRN: [id number]  Subjective:    Chief Complaint  Patient presents with  . Chest Pain  . Follow-up    cath, 7-10 days    HPI: Judy Baker  is a 48 y.o. female  with HTN, HLD, obesity, OA, OSA on CPAP, recently underwent echocardiogram for family history of cardiomyopathy. Echocardiogram was without evidence of cardiomyopathy.   Due to nitroglycerin responsive chest pain and mildly abnormal Lexiscan nuclear stress test on 02/22/2019 that was considered intermediate risk study, underwent coronary angiogram on 03/30/2019 that revealed normal coronary arteries.  Symptoms potentially felt to be related to GERD or possible endothelial dysfunction.  Aspirin was discontinued.  She now presents for follow-up.  She reports after her procedure, she did have swelling at right radial access site, and called EMT that stated swelling was normal.  Swelling has since resolved.  She has not had any drainage.  She does continue to have occasional chest pain and has had to use nitroglycerin once since her procedure.  She reports that hypertension and hyperlipidemia are well controlled. She is working to lose weight and has lost about 20 lbs over the last several months.  Brother was recently diagnosed with familial type of cardiomyopathy. She reports occasional alcoholic drink, but not daily. She is current smoker, but is hoping to quit soon. Chantix was increased at her last visit and states that she has been able to significantly cut back.   Past Medical History:  Diagnosis Date  . Arthritis   . Asthma   . Carpal tunnel syndrome   . GERD (gastroesophageal reflux disease) 02/04/2019  . HLD (hyperlipidemia) 02/04/2019  . HTN (hypertension) 02/04/2019  . Hypertension   . Osteoarthritis     Past Surgical History:  Procedure Laterality Date  . BREAST SURGERY    . CARPAL TUNNEL RELEASE    .  CHOLECYSTECTOMY    . LEFT HEART CATH AND CORONARY ANGIOGRAPHY N/A 03/30/2019   Procedure: LEFT HEART CATH AND CORONARY ANGIOGRAPHY;  Surgeon: Yates Decamp, MD;  Location: [facility];  Service: Cardiovascular;  Laterality: N/A;  . TUBAL LIGATION      Social History   Socioeconomic History  . Marital status: Single    Spouse name: Not on file  . Number of children: 2  . Years of education: Not on file  . Highest education level: Not on file  Occupational History  . Not on file  Social Needs  . Financial resource strain: Not on file  . Food insecurity    Worry: Not on file    Inability: Not on file  . Transportation needs    Medical: Not on file    Non-medical: Not on file  Tobacco Use  . Smoking status: Current Every Day Smoker    Packs/day: 0.25    Years: 20.00    Pack years: 5.00    Types: Cigarettes  . Smokeless tobacco: Never Used  Substance and Sexual Activity  . Alcohol use: Yes    Comment: occas  . Drug use: Not Currently  . Sexual activity: Not on file  Lifestyle  . Physical activity    Days per week: Not on file    Minutes per session: Not on file  . Stress: Not on file  Relationships  . Social Musician on phone: Not on file    Gets together:  Not on file    Attends religious service: Not on file    Active member of club or organization: Not on file    Attends meetings of clubs or organizations: Not on file    Relationship status: Not on file  . Intimate partner violence    Fear of current or ex partner: Not on file    Emotionally abused: Not on file    Physically abused: Not on file    Forced sexual activity: Not on file  Other Topics Concern  . Not on file  Social History Narrative  . Not on file    Review of Systems  Constitution: Negative for decreased appetite, malaise/fatigue, weight gain and weight loss.  Eyes: Negative for visual disturbance.  Cardiovascular: Positive for chest pain and dyspnea on exertion. Negative for  claudication, leg swelling, orthopnea, palpitations and syncope.  Respiratory: Negative for hemoptysis and wheezing.   Endocrine: Negative for cold intolerance and heat intolerance.  Hematologic/Lymphatic: Does not bruise/bleed easily.  Skin: Negative for nail changes.  Musculoskeletal: Negative for muscle weakness and myalgias.  Gastrointestinal: Negative for abdominal pain, change in bowel habit, nausea and vomiting.  Neurological: Negative for difficulty with concentration, dizziness, focal weakness and headaches.  Psychiatric/Behavioral: Negative for altered mental status and suicidal ideas.  All other systems reviewed and are negative.     Objective:  Blood pressure (!) 159/84, pulse 66, temperature 97.7 F (36.5 C), height 5\' 8"  (1.727 m), weight (!) 343 lb 12.8 oz (155.9 kg), SpO2 96 %. Body mass index is 52.27 kg/m.    Physical Exam  Constitutional: She is oriented to person, place, and time. Vital signs are normal. She appears well-developed and well-nourished.  Morbidly obese  HENT:  Head: Normocephalic and atraumatic.  Neck: Normal range of motion.  Cardiovascular: Normal rate, regular rhythm, normal heart sounds and intact distal pulses.  Pulmonary/Chest: Effort normal and breath sounds normal. No accessory muscle usage. No respiratory distress.  Abdominal: Soft. Bowel sounds are normal.  Musculoskeletal: Normal range of motion.  Neurological: She is alert and oriented to person, place, and time.  Skin: Skin is warm and dry.  Vitals reviewed.  Radiology: No results found.  Laboratory examination:    CMP Latest Ref Rng & Units 03/18/2019 03/01/2019  Glucose 65 - 99 mg/dL 98 73  BUN 6 - 24 mg/dL 15 11  Creatinine 0.57 - 1.00 mg/dL 0.87 1.04(H)  Sodium 134 - 144 mmol/L 140 136  Potassium 3.5 - 5.2 mmol/L 3.9 3.4(L)  Chloride 96 - 106 mmol/L 101 99  CO2 20 - 29 mmol/L 26 24  Calcium 8.7 - 10.2 mg/dL 9.5 9.6   CBC Latest Ref Rng & Units 03/18/2019 03/01/2019  WBC  3.4 - 10.8 x10E3/uL 5.4 5.9  Hemoglobin 11.1 - 15.9 g/dL 14.7 15.5(H)  Hematocrit 34.0 - 46.6 % 45.2 47.2(H)  Platelets 150 - 450 x10E3/uL 262 268   Lipid Panel  No results found for: CHOL, TRIG, HDL, CHOLHDL, VLDL, LDLCALC, LDLDIRECT HEMOGLOBIN A1C No results found for: HGBA1C, MPG TSH No results for input(s): TSH in the last 8760 hours.  PRN Meds:. Medications Discontinued During This Encounter  Medication Reason  . omeprazole (PRILOSEC) 40 MG capsule Discontinued by provider  . metoprolol succinate (TOPROL XL) 25 MG 24 hr tablet Dose change   Current Meds  Medication Sig  . albuterol (PROVENTIL) (2.5 MG/3ML) 0.083% nebulizer solution Inhale 2.5 mg into the lungs 3 (three) times daily as needed for wheezing or shortness of breath.   Marland Kitchen  amLODipine (NORVASC) 5 MG tablet Take 1 tablet (5 mg total) by mouth daily.  Marland Kitchen. azelastine (ASTELIN) 0.1 % nasal spray Place 2 sprays into both nostrils 2 (two) times daily as needed (allergies).   . busPIRone (BUSPAR) 10 MG tablet Take 10 mg by mouth 2 (two) times daily.  . cetirizine HCl (ZYRTEC) 1 MG/ML solution Take 5 mg by mouth daily as needed (allergies.).  Marland Kitchen. Cholecalciferol (VITAMIN D-3) 125 MCG (5000 UT) TABS Take 5,000 Units by mouth daily.  . diclofenac sodium (VOLTAREN) 1 % GEL Apply 2-4 g topically 4 (four) times daily as needed (pain.).  Marland Kitchen. DULoxetine (CYMBALTA) 60 MG capsule Take 60 mg by mouth daily.  . fluticasone (FLONASE) 50 MCG/ACT nasal spray Place 1 spray into both nostrils 2 (two) times daily as needed for allergies.   . furosemide (LASIX) 20 MG tablet Take 20 mg by mouth daily as needed (fluid retention.).   Marland Kitchen. gabapentin (NEURONTIN) 800 MG tablet Take 800 mg by mouth 4 (four) times daily as needed (pain.).   Marland Kitchen. HYDROcodone-acetaminophen (NORCO) 10-325 MG tablet Take 1 tablet by mouth 3 (three) times daily as needed (for pain).   . montelukast (SINGULAIR) 10 MG tablet Take 10 mg by mouth at bedtime.  . Multiple Vitamin  (MULTIVITAMIN WITH MINERALS) TABS tablet Take 1 tablet by mouth daily. Centrum Silver  . nitroGLYCERIN (NITROSTAT) 0.4 MG SL tablet Place 1 tablet (0.4 mg total) under the tongue every 5 (five) minutes as needed for chest pain.  . Omega-3 Fatty Acids (FISH OIL) 1000 MG CAPS Take 1,000 mg by mouth 2 (two) times daily.   Marland Kitchen. OVER THE COUNTER MEDICATION Take 5 mLs by mouth daily as needed (constipation/energy/various ailments). [place] (1 tsp) as needed  . potassium chloride SA (K-DUR) 20 MEQ tablet Take 20 mEq by mouth 2 (two) times daily.   . pravastatin (PRAVACHOL) 40 MG tablet Take 40 mg by mouth daily.  . promethazine (PHENERGAN) 25 MG tablet Take 25 mg by mouth every 6 (six) hours as needed for nausea or vomiting.   Marland Kitchen. spironolactone-hydrochlorothiazide (ALDACTAZIDE) 25-25 MG tablet Take 1 tablet by mouth daily.  . SYMBICORT 160-4.5 MCG/ACT inhaler Inhale 2 puffs into the lungs 2 (two) times daily.   Marland Kitchen. tiZANidine (ZANAFLEX) 4 MG tablet Take 4 mg by mouth 3 (three) times daily as needed for muscle spasms.   . traMADol (ULTRAM) 50 MG tablet Take 50 mg by mouth every 12 (twelve) hours as needed (pain).   . traZODone (DESYREL) 100 MG tablet Take 100 mg by mouth at bedtime.  . varenicline (CHANTIX) 0.5 MG tablet Take 1 tablet (0.5 mg total) by mouth 2 (two) times daily.  . varenicline (CHANTIX) 1 MG tablet Take 1 mg by mouth 2 (two) times daily.  . vitamin C (ASCORBIC ACID) 500 MG tablet Take 500 mg by mouth daily.  Marland Kitchen. zolpidem (AMBIEN) 10 MG tablet Take 10 mg by mouth at bedtime as needed for sleep.  . [DISCONTINUED] metoprolol succinate (TOPROL XL) 25 MG 24 hr tablet Take 1 tablet (25 mg total) by mouth daily. (Patient taking differently: Take 25 mg by mouth every evening. )  . [DISCONTINUED] omeprazole (PRILOSEC) 40 MG capsule Take 40 mg by mouth daily at 12 noon.     Cardiac Studies:   Echocardiogram 01/20/2019: Left ventricle cavity is normal in size. Mild concentric hypertrophy of the left  ventricle. Normal LV systolic function with EF 57%. Normal global wall motion. Doppler evidence of grade I (impaired) diastolic dysfunction,  normal LAP. Calculated EF 57%. Left atrial cavity is mildly dilated at 3.9 cm. Mild to moderate mitral regurgitation. Mild tricuspid regurgitation. Estimated pulmonary artery systolic pressure is 28 mmHg.  Lexiscan Myoview stress test 02/22/2019: Lexiscan stress test was performed. Stress EKG is non-diagnostic, as this is pharmacological stress test. Normal myocardial thickening with LVEF 50%. SPECT stress and rest images reveal small size, mild intensity, decreased tracer uptake in apical myocardium. In addition, SPECT rest images reveal decreased tracer uptake of medium intensity in basal anteroseptal, inferoseptal myocardium. These findings are most likely due to breast attenuation in a morbidly obese patient. Clinical correlation recommended.  Intermediate risk study.   Assessment:     ICD-10-CM   1. Angina pectoris (HCC)  I20.9   2. Essential hypertension  I10   3. Tobacco use disorder  F17.200   4. Morbid obesity (HCC)  E66.01     EKG 02/05/2019: Normal sinus rhythm at 78 bpm, left atrial enlargement, normal axis, PRWP cannot exclude anterior infarct old. No evidence of ischemia.   Recommendations:   Although patient has nitroglycerin responsive chest pain, recent coronary angiogram showed normal coronary arteries.  Symptoms potentially related to endothelial dysfunction versus GERD.  She does have issues with acid reflux.  I will change her omeprazole to pantoprazole 40 mg twice daily to see if she has any improvement in her symptoms with this.  She may continue to use nitroglycerin as needed.  Aggressive risk factor medication has been recommended.  Right access site is healing well, no hematoma is noted.  Has 2+ radial pulse.  Her blood pressure does continue to be elevated.  Will further increase her metoprolol up to 50 mg daily.  I would like  to see her back in 4 weeks to follow-up on her blood pressure.  She has been able to cut back to 3 to 4 cigarettes/day with use of Chantix, congratulated her on this and provided positive reinforcement.   Toniann Fail, MSN, APRN, FNP-C [facility] Office: [phone] Fax: [phone]

## 2019-04-24 ENCOUNTER — Other Ambulatory Visit: Payer: Self-pay | Admitting: Cardiology

## 2019-05-05 ENCOUNTER — Other Ambulatory Visit: Payer: Self-pay | Admitting: Cardiology

## 2019-05-05 ENCOUNTER — Ambulatory Visit: Payer: Medicaid Other | Admitting: Cardiology

## 2019-05-13 ENCOUNTER — Other Ambulatory Visit: Payer: Self-pay

## 2019-05-13 ENCOUNTER — Ambulatory Visit: Payer: Medicaid Other | Admitting: Cardiology

## 2019-05-13 ENCOUNTER — Emergency Department (HOSPITAL_COMMUNITY)
Admission: EM | Admit: 2019-05-13 | Discharge: 2019-05-14 | Disposition: A | Discharge status: Home / Self Care | Payer: Medicaid Other | Attending: Emergency Medicine | Admitting: Emergency Medicine

## 2019-05-13 ENCOUNTER — Emergency Department (HOSPITAL_COMMUNITY): Payer: Medicaid Other

## 2019-05-13 DIAGNOSIS — F17210 Nicotine dependence, cigarettes, uncomplicated: Secondary | ICD-10-CM | POA: Insufficient documentation

## 2019-05-13 DIAGNOSIS — Z79899 Other long term (current) drug therapy: Secondary | ICD-10-CM | POA: Insufficient documentation

## 2019-05-13 DIAGNOSIS — R040 Epistaxis: Secondary | ICD-10-CM

## 2019-05-13 DIAGNOSIS — I10 Essential (primary) hypertension: Secondary | ICD-10-CM | POA: Diagnosis not present

## 2019-05-13 DIAGNOSIS — R0789 Other chest pain: Secondary | ICD-10-CM | POA: Diagnosis not present

## 2019-05-13 LAB — TROPONIN I (HIGH SENSITIVITY): Troponin I (High Sensitivity): <2 ng/L (ref <18)

## 2019-05-13 LAB — BASIC METABOLIC PANEL
Anion gap: 9 (ref 5–15)
BUN: 20 mg/dL (ref 6–20)
CO2: 27 mmol/L (ref 22–32)
Calcium: 9.6 mg/dL (ref 8.9–10.3)
Chloride: 105 mmol/L (ref 98–111)
Creatinine, Ser: 0.92 mg/dL (ref 0.44–1.00)
GFR calc Af Amer: >60 mL/min (ref >60)
GFR calc non Af Amer: >60 mL/min (ref >60)
Glucose, Bld: 96 mg/dL (ref 70–99)
Potassium: 3.3 mmol/L — ABNORMAL LOW (ref 3.5–5.1)
Sodium: 141 mmol/L (ref 135–145)

## 2019-05-13 LAB — CBC
HCT: 43.9 % (ref 36.0–46.0)
Hemoglobin: 14.1 g/dL (ref 12.0–15.0)
MCH: 30 pg (ref 26.0–34.0)
MCHC: 32.1 g/dL (ref 30.0–36.0)
MCV: 93.4 fL (ref 80.0–100.0)
Platelets: 298 10*3/uL (ref 150–400)
RBC: 4.7 MIL/uL (ref 3.87–5.11)
RDW: 13.3 % (ref 11.5–15.5)
WBC: 7.8 10*3/uL (ref 4.0–10.5)
nRBC: 0 % (ref 0.0–0.2)

## 2019-05-13 LAB — PROTIME-INR
INR: 1 (ref 0.8–1.2)
Prothrombin Time: 13 seconds (ref 11.4–15.2)

## 2019-05-13 NOTE — ED Notes (Signed)
Patient transported to X-ray 

## 2019-05-13 NOTE — ED Provider Notes (Addendum)
[facility]-EMERGENCY DEPT Provider Note   CSN: [id number] Arrival date & time: 05/13/19  1633     History   Chief Complaint Chief Complaint  Patient presents with  . Epistaxis    HPI Judy Baker is a 48 y.o. female.     Patient presents to ED with complaint of recurrent nosebleed today. She has been able to control with direct pressure and positioning. She is not actively bleeding at present, and nares are clear. She is also complaining of mild chest discomfort, dyspnea on exertion. History of angina. Has not used her nitroglycerin today. No cough or fever.  The history is provided by the patient. No language interpreter was used.  Epistaxis Severity:  Moderate Duration:  1 day Timing:  Intermittent Context: aspirin use and hypertension   Relieved by:  Applying pressure Associated symptoms: no cough, no fever, no headaches and no sinus pain   Chest Pain Pain location:  L chest Pain quality: sharp   Pain radiates to:  L shoulder Pain severity:  Mild Timing:  Intermittent Progression:  Waxing and waning Chronicity:  Recurrent Associated symptoms: no cough, no fever and no headache   Risk factors: coronary artery disease and hypertension     Past Medical History:  Diagnosis Date  . Arthritis   . Asthma   . Carpal tunnel syndrome   . GERD (gastroesophageal reflux disease) 02/04/2019  . HLD (hyperlipidemia) 02/04/2019  . HTN (hypertension) 02/04/2019  . Hypertension   . Osteoarthritis     Patient Active Problem List   Diagnosis Date Noted  . Angina pectoris (HCC) 03/30/2019  . GERD (gastroesophageal reflux disease) 02/04/2019  . HLD (hyperlipidemia) 02/04/2019  . HTN (hypertension) 02/04/2019    Past Surgical History:  Procedure Laterality Date  . BREAST SURGERY    . CARPAL TUNNEL RELEASE    . CHOLECYSTECTOMY    . LEFT HEART CATH AND CORONARY ANGIOGRAPHY N/A 03/30/2019   Procedure: LEFT HEART CATH AND CORONARY ANGIOGRAPHY;  Surgeon:  Yates Decamp, MD;  Location: [facility];  Service: Cardiovascular;  Laterality: N/A;  . TUBAL LIGATION       OB History   No obstetric history on file.      Home Medications    Prior to Admission medications   Medication Sig Start Date End Date Taking? Authorizing Provider  albuterol (PROVENTIL) (2.5 MG/3ML) 0.083% nebulizer solution Inhale 2.5 mg into the lungs 3 (three) times daily as needed for wheezing or shortness of breath.  02/18/19   [provider]  amLODipine (NORVASC) 5 MG tablet Take 1 tablet (5 mg total) by mouth daily. 03/12/19 06/10/19  Toniann Fail, NP  azelastine (ASTELIN) 0.1 % nasal spray Place 2 sprays into both nostrils 2 (two) times daily as needed (allergies).  12/21/18   [provider]  busPIRone (BUSPAR) 10 MG tablet Take 10 mg by mouth 2 (two) times daily.    [provider]  cetirizine HCl (ZYRTEC) 1 MG/ML solution Take 5 mg by mouth daily as needed (allergies.).    [provider]  Cholecalciferol (VITAMIN D-3) 125 MCG (5000 UT) TABS Take 5,000 Units by mouth daily.    [provider]  diclofenac sodium (VOLTAREN) 1 % GEL Apply 2-4 g topically 4 (four) times daily as needed (pain.).    [provider]  DULoxetine (CYMBALTA) 60 MG capsule Take 60 mg by mouth daily.    [provider]  fluticasone (FLONASE) 50 MCG/ACT nasal spray Place 1 spray  into both nostrils 2 (two) times daily as needed for allergies.  12/21/18   [provider]  furosemide (LASIX) 20 MG tablet Take 20 mg by mouth daily as needed (fluid retention.).     [provider]  gabapentin (NEURONTIN) 800 MG tablet Take 800 mg by mouth 4 (four) times daily as needed (pain.).     [provider]  HYDROcodone-acetaminophen (NORCO) 10-325 MG tablet Take 1 tablet by mouth 3 (three) times daily as needed (for pain).  01/04/19   [provider]  metoprolol succinate (TOPROL-XL) 50 MG 24 hr tablet Take  1 tablet (50 mg total) by mouth daily. Take with or immediately following a meal. 04/07/19 07/06/19  Toniann Fail, NP  montelukast (SINGULAIR) 10 MG tablet Take 10 mg by mouth at bedtime.    [provider]  Multiple Vitamin (MULTIVITAMIN WITH MINERALS) TABS tablet Take 1 tablet by mouth daily. Centrum Silver    [provider]  nitroGLYCERIN (NITROSTAT) 0.4 MG SL tablet Place 1 tablet (0.4 mg total) under the tongue every 5 (five) minutes as needed for chest pain. 02/05/19 05/06/19  Toniann Fail, NP  Omega-3 Fatty Acids (FISH OIL) 1000 MG CAPS Take 1,000 mg by mouth 2 (two) times daily.     [provider]  OVER THE COUNTER MEDICATION Take 5 mLs by mouth daily as needed (constipation/energy/various ailments). [place] (1 tsp) as needed    [provider]  pantoprazole (PROTONIX) 40 MG tablet TAKE ONE TABLET BY MOUTH TWICE DAILY 05/06/19   Toniann Fail, NP  potassium chloride SA (K-DUR) 20 MEQ tablet Take 20 mEq by mouth 2 (two) times daily.     [provider]  pravastatin (PRAVACHOL) 40 MG tablet Take 40 mg by mouth daily.    [provider]  promethazine (PHENERGAN) 25 MG tablet Take 25 mg by mouth every 6 (six) hours as needed for nausea or vomiting.  12/21/18   [provider]  spironolactone-hydrochlorothiazide (ALDACTAZIDE) 25-25 MG tablet Take 1 tablet by mouth daily. 12/21/18   [provider]  SYMBICORT 160-4.5 MCG/ACT inhaler Inhale 2 puffs into the lungs 2 (two) times daily.  12/21/18   [provider]  tiZANidine (ZANAFLEX) 4 MG tablet Take 4 mg by mouth 3 (three) times daily as needed for muscle spasms.  12/21/18   [provider]  traMADol (ULTRAM) 50 MG tablet Take 50 mg by mouth every 12 (twelve) hours as needed (pain).  12/29/18   [provider]  traZODone (DESYREL) 100 MG tablet Take 100 mg by mouth at bedtime. 12/21/18   [provider]  varenicline (CHANTIX)  0.5 MG tablet Take 1 tablet (0.5 mg total) by mouth 2 (two) times daily. 02/05/19   Toniann Fail, NP  varenicline (CHANTIX) 1 MG tablet Take 1 mg by mouth 2 (two) times daily.    [provider]  vitamin C (ASCORBIC ACID) 500 MG tablet Take 500 mg by mouth daily.    [provider]  zolpidem (AMBIEN) 10 MG tablet Take 10 mg by mouth at bedtime as needed for sleep.    [provider]    Family History Family History  Problem Relation Age of Onset  . Aneurysm Mother   . Valvular heart disease Mother   . Stroke Father   . Heart murmur Sister   . Cardiomyopathy Brother     Social History Social History   Tobacco Use  . Smoking status: Current Every Day Smoker  Packs/day: 0.25    Years: 20.00    Pack years: 5.00    Types: Cigarettes  . Smokeless tobacco: Never Used  Substance Use Topics  . Alcohol use: Yes    Comment: occas  . Drug use: Not Currently     Allergies   Penicillins and Sulfa antibiotics   Review of Systems Review of Systems  Constitutional: Negative for fever.  HENT: Positive for nosebleeds. Negative for sinus pain.   Respiratory: Negative for cough.   Cardiovascular: Positive for chest pain.  Neurological: Negative for headaches.     Physical Exam Updated Vital Signs BP 133/79 (BP Location: Left Arm)   Pulse 69   Temp 98.4 F (36.9 C) (Oral)   Resp 19   Ht 5\' 7"  (1.702 m)   Wt (!) 153.3 kg   SpO2 100%   BMI 52.94 kg/m   Physical Exam Vitals and nursing note reviewed.  HENT:     Nose:     Right Nostril: Epistaxis present.     Left Nostril: Epistaxis present.     Comments: Minimal bleeding noted.    Mouth/Throat:     Mouth: Mucous membranes are moist.  Eyes:     Conjunctiva/sclera: Conjunctivae normal.  Cardiovascular:     Rate and Rhythm: Normal rate and regular rhythm.  Pulmonary:     Effort: Pulmonary effort is normal.     Breath sounds: Normal breath sounds.  Musculoskeletal:        General:  Normal range of motion.     Cervical back: Neck supple.  Skin:    General: Skin is warm and dry.  Neurological:     Mental Status: She is alert and oriented to person, place, and time.  Psychiatric:        Mood and Affect: Mood normal.      ED Treatments / Results  Labs (all labs ordered are listed, but only abnormal results are displayed) Labs Reviewed - No data to display  EKG None  Radiology No results found.  Procedures Procedures (including critical care time)  Medications Ordered in ED Medications - No data to display   Initial Impression / Assessment and Plan / ED Course  I have reviewed the triage vital signs and the nursing notes.  Pertinent labs & imaging results that were available during my care of the patient were reviewed by me and considered in my medical decision making (see chart for details).       Patient with recurrent bilateral nose bleed. Bleeding easily controlled with direct pressure. Afrin spray x 1 each nare while in ED. Normal PT/INR.  Patient is to be discharged with recommendation to follow up with PCP in regards to today's hospital visit. VSS, no tracheal deviation, no JVD or new murmur, RRR, breath sounds equal bilaterally, EKG without acute abnormalities, negative troponin, and negative CXR. Pt has been advised to return to the ED is CP becomes exertional, associated with diaphoresis or nausea, radiates to left jaw/arm, worsens or becomes concerning in any way. Pt appears reliable for follow up and is agreeable to discharge.   Case has been discussed with and seen by Dr. Roderic Palau who agrees with the above plan to discharge.   Patient signed out to S. Upstill, PA, pending repeat troponin.  Final Clinical Impressions(s) / ED Diagnoses   Final diagnoses:  Epistaxis, recurrent  Other chest pain    ED Discharge Orders    None       Etta Quill, NP 05/14/19 [phone]  Bethann BerkshireZammit, Joseph, MD 05/14/19 2305    Felicie MornSmith, Lexy Meininger, NP 07/07/19  [phone]    Bethann BerkshireZammit, Joseph, MD 07/07/19 1019

## 2019-05-13 NOTE — ED Notes (Signed)
Pt presented to ED today for epistaxis  But is also states that she is having chest discomfort that radiates to her right shoulder and in between shoulder blades. Pt says she has had the pain before because she has a history of angina but she has not had to take nitroglycerin tablets in a few weeks. Pt also complains of dyspnea on exertion and nausea.  Pt denies fever, cough, headache, or blurry vision.

## 2019-05-13 NOTE — ED Triage Notes (Signed)
Patient reports she has had multiple episodes of epistaxis and is having SOB. Patient denies CHF. Patient reports she is on medication for her BP and takes aspirin daily as well. Patient reports her last bleed was 45 minutes ago.

## 2019-05-14 LAB — TROPONIN I (HIGH SENSITIVITY): Troponin I (High Sensitivity): <2 ng/L (ref <18)

## 2019-05-14 MED ORDER — TRAMADOL HCL 50 MG PO TABS
50.0000 mg | ORAL_TABLET | Freq: Once | ORAL | Status: AC
  Administered 2019-05-14: 50 mg via ORAL
  Filled 2019-05-14: qty 1

## 2019-05-14 MED ORDER — OXYMETAZOLINE HCL 0.05 % NA SOLN
1.0000 | Freq: Once | NASAL | Status: AC
  Administered 2019-05-14: 1 via NASAL
  Filled 2019-05-14: qty 30

## 2019-05-14 MED ORDER — METOPROLOL TARTRATE 25 MG PO TABS
50.0000 mg | ORAL_TABLET | Freq: Once | ORAL | Status: AC
  Administered 2019-05-14: 50 mg via ORAL
  Filled 2019-05-14: qty 2

## 2019-05-14 MED ORDER — PANTOPRAZOLE SODIUM 40 MG PO TBEC
40.0000 mg | DELAYED_RELEASE_TABLET | Freq: Once | ORAL | Status: AC
  Administered 2019-05-14: 40 mg via ORAL
  Filled 2019-05-14: qty 1

## 2019-05-14 MED ORDER — MONTELUKAST SODIUM 10 MG PO TABS
10.0000 mg | ORAL_TABLET | Freq: Every day | ORAL | Status: DC
  Administered 2019-05-14: 01:00:00 10 mg via ORAL
  Filled 2019-05-14: qty 1

## 2019-05-14 MED ORDER — POTASSIUM CHLORIDE CRYS ER 20 MEQ PO TBCR
20.0000 meq | EXTENDED_RELEASE_TABLET | Freq: Once | ORAL | Status: AC
  Administered 2019-05-14: 20 meq via ORAL
  Filled 2019-05-14: qty 1

## 2019-05-14 NOTE — Discharge Instructions (Addendum)
Please keep your scheduled appointment with cardiology for next week for recheck.   If nosebleed recurs, pinch the nose as we discussed. Use Afrin but for not longer than 3 consecutive days to avoid rebound congestion and nasal swelling. Return to the emergency department if you have severe bleeding that is continuous, and/or follow up with Dr. Constance Holster as referred.

## 2019-05-14 NOTE — ED Provider Notes (Signed)
Here with Epistaxis and cp, seen by Etta Quill, NP, and Dr. Roderic Palau.   At time of sign out, she is pending 2nd troponin - h/o angina. No EKG changes. If negative delta, can be discharged. She has cardiology appointment in place for next week already.   Re: nosebleed. She is getting Afrin - will need re-eval for nosebleed - an ASA only On re-eval - no further bleeding. Reviewed technique of nose pinching which she was not doing correctly. Will refer to ENT if recurrent nosebleeds continue.     Charlann Lange, PA-C 05/14/19 0201    Milton Ferguson, MD 05/14/19 [phone]

## 2019-05-18 ENCOUNTER — Ambulatory Visit: Payer: Medicaid Other | Admitting: Cardiology

## 2019-05-20 ENCOUNTER — Encounter: Payer: Self-pay | Admitting: Cardiology

## 2019-05-20 ENCOUNTER — Ambulatory Visit (INDEPENDENT_AMBULATORY_CARE_PROVIDER_SITE_OTHER): Payer: Medicaid Other | Admitting: Cardiology

## 2019-05-20 ENCOUNTER — Other Ambulatory Visit: Payer: Self-pay

## 2019-05-20 VITALS — BP 138/87 | HR 68 | Temp 97.1°F | Ht 66.5 in | Wt 347.8 lb

## 2019-05-20 DIAGNOSIS — I10 Essential (primary) hypertension: Secondary | ICD-10-CM | POA: Diagnosis not present

## 2019-05-20 DIAGNOSIS — F17200 Nicotine dependence, unspecified, uncomplicated: Secondary | ICD-10-CM

## 2019-05-20 DIAGNOSIS — I209 Angina pectoris, unspecified: Secondary | ICD-10-CM

## 2019-05-20 MED ORDER — ISOSORBIDE MONONITRATE ER 30 MG PO TB24: 30.0000 mg | ORAL_TABLET | Freq: Every day | ORAL | 2 refills | Status: DC

## 2019-05-20 MED ORDER — VARENICLINE TARTRATE 1 MG PO TABS: 1.0000 mg | ORAL_TABLET | Freq: Two times a day (BID) | ORAL | 1 refills | Status: DC

## 2019-05-20 NOTE — Progress Notes (Signed)
Primary Physician:  Coralee RudScheer, James, DO   Patient ID: Judy OlszewskiAngela Baker, female    DOB: January 25, 1971, 48 y.o.   MRN: [id number]  Subjective:    Chief Complaint  Patient presents with  . Hypertension  . Chest Pain  . Follow-up    anxiety    HPI: Judy Baker  is a 48 y.o. female  with HTN, HLD, obesity, OA, OSA on CPAP, recently underwent echocardiogram for family history of cardiomyopathy. Echocardiogram was without evidence of cardiomyopathy.   Due to nitroglycerin responsive chest pain and mildly abnormal Lexiscan nuclear stress test on 02/22/2019 that was considered intermediate risk study, underwent coronary angiogram on 03/30/2019 that revealed normal coronary arteries.  Symptoms potentially felt to be related to GERD or possible endothelial dysfunction.  Aspirin was discontinued.  She was started on pantoprazole at her last visit. She now presents for 4 week follow up.  She continues to have occasional episodes of nitroglycerin responsive chest pain. She does report worsening depression the last few weeks as she stopped her Cymbalta.   She reports that hypertension and hyperlipidemia are well controlled. She is working to lose weight and has lost about 20 lbs over the last several months.  Brother was recently diagnosed with familial type of cardiomyopathy. She reports occasional alcoholic drink, but not daily. She is current smoker, but is hoping to quit soon. She has ran out of Chantix and is requesting a refill.   Past Medical History:  Diagnosis Date  . Arthritis   . Asthma   . Carpal tunnel syndrome   . GERD (gastroesophageal reflux disease) 02/04/2019  . HLD (hyperlipidemia) 02/04/2019  . HTN (hypertension) 02/04/2019  . Hypertension   . Osteoarthritis     Past Surgical History:  Procedure Laterality Date  . BREAST SURGERY    . CARPAL TUNNEL RELEASE    . CHOLECYSTECTOMY    . LEFT HEART CATH AND CORONARY ANGIOGRAPHY N/A 03/30/2019   Procedure: LEFT HEART CATH AND CORONARY  ANGIOGRAPHY;  Surgeon: Yates DecampGanji, Jay, MD;  Location: [facility];  Service: Cardiovascular;  Laterality: N/A;  . TUBAL LIGATION      Social History   Socioeconomic History  . Marital status: Single    Spouse name: Not on file  . Number of children: 2  . Years of education: Not on file  . Highest education level: Not on file  Occupational History  . Not on file  Social Needs  . Financial resource strain: Not on file  . Food insecurity    Worry: Not on file    Inability: Not on file  . Transportation needs    Medical: Not on file    Non-medical: Not on file  Tobacco Use  . Smoking status: Current Every Day Smoker    Packs/day: 0.25    Years: 20.00    Pack years: 5.00    Types: Cigarettes  . Smokeless tobacco: Never Used  Substance and Sexual Activity  . Alcohol use: Yes    Comment: occas  . Drug use: Not Currently  . Sexual activity: Not on file  Lifestyle  . Physical activity    Days per week: Not on file    Minutes per session: Not on file  . Stress: Not on file  Relationships  . Social Musicianconnections    Talks on phone: Not on file    Gets together: Not on file    Attends religious service: Not on file    Active member of club or organization:  Not on file    Attends meetings of clubs or organizations: Not on file    Relationship status: Not on file  . Intimate partner violence    Fear of current or ex partner: Not on file    Emotionally abused: Not on file    Physically abused: Not on file    Forced sexual activity: Not on file  Other Topics Concern  . Not on file  Social History Narrative  . Not on file    Review of Systems  Constitution: Negative for decreased appetite, malaise/fatigue, weight gain and weight loss.  Eyes: Negative for visual disturbance.  Cardiovascular: Positive for chest pain and dyspnea on exertion. Negative for claudication, leg swelling, orthopnea, palpitations and syncope.  Respiratory: Negative for hemoptysis and wheezing.    Endocrine: Negative for cold intolerance and heat intolerance.  Hematologic/Lymphatic: Does not bruise/bleed easily.  Skin: Negative for nail changes.  Musculoskeletal: Negative for muscle weakness and myalgias.  Gastrointestinal: Negative for abdominal pain, change in bowel habit, nausea and vomiting.  Neurological: Negative for difficulty with concentration, dizziness, focal weakness and headaches.  Psychiatric/Behavioral: Negative for altered mental status and suicidal ideas.  All other systems reviewed and are negative.     Objective:  Blood pressure 138/87, pulse 68, temperature (!) 97.1 F (36.2 C), height 5' 6.5" (1.689 m), weight (!) 347 lb 12.8 oz (157.8 kg), SpO2 98 %. Body mass index is 55.3 kg/m.    Physical Exam  Constitutional: She is oriented to person, place, and time. Vital signs are normal. She appears well-developed and well-nourished.  Morbidly obese  HENT:  Head: Normocephalic and atraumatic.  Neck: Normal range of motion.  Cardiovascular: Normal rate, regular rhythm, normal heart sounds and intact distal pulses.  Pulmonary/Chest: Effort normal and breath sounds normal. No accessory muscle usage. No respiratory distress.  Abdominal: Soft. Bowel sounds are normal.  Musculoskeletal: Normal range of motion.  Neurological: She is alert and oriented to person, place, and time.  Skin: Skin is warm and dry.  Vitals reviewed.  Radiology: No results found.  Laboratory examination:    CMP Latest Ref Rng & Units 05/13/2019 03/18/2019 03/01/2019  Glucose 70 - 99 mg/dL 96 98 73  BUN 6 - 20 mg/dL 20 15 11   Creatinine 0.44 - 1.00 mg/dL 3.81 8.29)  Sodium 135 - 145 mmol/L 141 140 136  Potassium 3.5 - 5.1 mmol/L 3.3(L) 3.9 3.4(L)  Chloride 98 - 111 mmol/L 105 101 99  CO2 22 - 32 mmol/L 27 26 24   Calcium 8.9 - 10.3 mg/dL 9.6 9.5 9.6   CBC Latest Ref Rng & Units 05/13/2019 03/18/2019 03/01/2019  WBC 4.0 - 10.5 K/uL 7.8 5.4 5.9  Hemoglobin 12.0 - 15.0 g/dL 03/20/2019  03/03/2019 15.5(H)  Hematocrit 36.0 - 46.0 % 43.9 45.2 47.2(H)  Platelets 150 - 400 K/uL 298 262 268   Lipid Panel  No results found for: CHOL, TRIG, HDL, CHOLHDL, VLDL, LDLCALC, LDLDIRECT HEMOGLOBIN A1C No results found for: HGBA1C, MPG TSH No results for input(s): TSH in the last 8760 hours.  PRN Meds:. Medications Discontinued During This Encounter  Medication Reason  . DULoxetine (CYMBALTA) 60 MG capsule Error  . varenicline (CHANTIX) 0.5 MG tablet Error  . varenicline (CHANTIX) 1 MG tablet Reorder   Current Meds  Medication Sig  . albuterol (PROVENTIL) (2.5 MG/3ML) 0.083% nebulizer solution Inhale 2.5 mg into the lungs 3 (three) times daily as needed for wheezing or shortness of breath.   69.6 amLODipine (NORVASC) 5 MG  tablet Take 1 tablet (5 mg total) by mouth daily.  Marland Kitchen azelastine (ASTELIN) 0.1 % nasal spray Place 2 sprays into both nostrils 2 (two) times daily as needed (allergies).   . busPIRone (BUSPAR) 10 MG tablet Take 10 mg by mouth 2 (two) times daily.  . cetirizine HCl (ZYRTEC) 1 MG/ML solution Take 5 mg by mouth daily as needed (allergies.).  Marland Kitchen Cholecalciferol (VITAMIN D-3) 125 MCG (5000 UT) TABS Take 5,000 Units by mouth daily.  . diclofenac sodium (VOLTAREN) 1 % GEL Apply 2-4 g topically 4 (four) times daily as needed (pain.).  Marland Kitchen ELDERBERRY PO Take 1 tablet by mouth daily.  . fluticasone (FLONASE) 50 MCG/ACT nasal spray Place 1 spray into both nostrils 2 (two) times daily as needed for allergies.   . furosemide (LASIX) 20 MG tablet Take 20 mg by mouth daily as needed (fluid retention.).   Marland Kitchen gabapentin (NEURONTIN) 800 MG tablet Take 800 mg by mouth 4 (four) times daily as needed (pain.).   Marland Kitchen HYDROcodone-acetaminophen (NORCO) 10-325 MG tablet Take 1 tablet by mouth 3 (three) times daily as needed (for pain).   . metoprolol succinate (TOPROL-XL) 50 MG 24 hr tablet Take 1 tablet (50 mg total) by mouth daily. Take with or immediately following a meal.  . montelukast (SINGULAIR) 10  MG tablet Take 10 mg by mouth at bedtime.  . Multiple Vitamin (MULTIVITAMIN WITH MINERALS) TABS tablet Take 1 tablet by mouth daily. Centrum Silver  . Multiple Vitamins-Minerals (CENTRUM SILVER 50+WOMEN) TABS Take 1 tablet by mouth daily.  . nitroGLYCERIN (NITROSTAT) 0.4 MG SL tablet Place 1 tablet (0.4 mg total) under the tongue every 5 (five) minutes as needed for chest pain.  . Omega-3 Fatty Acids (FISH OIL) 1000 MG CAPS Take 1,000 mg by mouth 2 (two) times daily.   Marland Kitchen OVER THE COUNTER MEDICATION Take 5 mLs by mouth daily as needed (constipation/energy/various ailments). [place] (1 tsp) as needed  . pantoprazole (PROTONIX) 40 MG tablet TAKE ONE TABLET BY MOUTH TWICE DAILY (Patient taking differently: Take 40 mg by mouth 2 (two) times daily. )  . potassium chloride SA (K-DUR) 20 MEQ tablet Take 20 mEq by mouth daily.   . pravastatin (PRAVACHOL) 40 MG tablet Take 40 mg by mouth daily.  . promethazine (PHENERGAN) 25 MG tablet Take 25 mg by mouth every 6 (six) hours as needed for nausea or vomiting.   Marland Kitchen spironolactone-hydrochlorothiazide (ALDACTAZIDE) 25-25 MG tablet Take 1 tablet by mouth daily.  . SYMBICORT 160-4.5 MCG/ACT inhaler Inhale 2 puffs into the lungs 2 (two) times daily.   Marland Kitchen tiZANidine (ZANAFLEX) 4 MG tablet Take 4 mg by mouth 3 (three) times daily as needed for muscle spasms.   . traMADol (ULTRAM) 50 MG tablet Take 50 mg by mouth every 12 (twelve) hours as needed (pain).   . traZODone (DESYREL) 100 MG tablet Take 100 mg by mouth at bedtime.  . vitamin C (ASCORBIC ACID) 500 MG tablet Take 500 mg by mouth daily.  Marland Kitchen zolpidem (AMBIEN) 10 MG tablet Take 10 mg by mouth at bedtime as needed for sleep.    Cardiac Studies:   Cath 03/30/2019:  LV end diastolic pressure is normal. The left ventricular systolic function is normal.  Recommendation: Patient chest pain could be related to GERD. She may have angina pectoris with normal coronary arteries as well due to endothelial dysfunction.  Overall I have normal coronary arteries.  Echocardiogram 01/20/2019: Left ventricle cavity is normal in size. Mild concentric hypertrophy of the left  ventricle. Normal LV systolic function with EF 57%. Normal global wall motion. Doppler evidence of grade I (impaired) diastolic dysfunction, normal LAP. Calculated EF 57%. Left atrial cavity is mildly dilated at 3.9 cm. Mild to moderate mitral regurgitation. Mild tricuspid regurgitation. Estimated pulmonary artery systolic pressure is 28 mmHg.  Lexiscan Myoview stress test 02/22/2019: Lexiscan stress test was performed. Stress EKG is non-diagnostic, as this is pharmacological stress test. Normal myocardial thickening with LVEF 50%. SPECT stress and rest images reveal small size, mild intensity, decreased tracer uptake in apical myocardium. In addition, SPECT rest images reveal decreased tracer uptake of medium intensity in basal anteroseptal, inferoseptal myocardium. These findings are most likely due to breast attenuation in a morbidly obese patient. Clinical correlation recommended.  Intermediate risk study.   Assessment:     ICD-10-CM   1. Angina pectoris (HCC)  I20.9   2. Essential hypertension  I10   3. Tobacco use disorder  F17.200   4. Morbid obesity ([place])  E66.01     EKG 02/05/2019: Normal sinus rhythm at 78 bpm, left atrial enlargement, normal axis, PRWP cannot exclude anterior infarct old. No evidence of ischemia.   Recommendations:   Patient continues to have chest pain despite PPI that is nitroglycerin. I suspect that her chest pain is related to endothelial dysfunction. Will add Imdur 30 mg daily. Continue with other present medications. Blood pressure has improved.   She will also continue to work on weight loss. She is having some difficulty with depression the last few weeks. I have recommended that she resume taking her Cymbalta. I have also refilled her Chantix as she has had good progress towards complete smoking cessation  with this. I will see her back in 8 weeks for follow up.    Miquel Dunn, MSN, APRN, FNP-C [facility]. [place] Office: [phone] Fax: [phone]

## 2019-05-31 ENCOUNTER — Other Ambulatory Visit: Payer: Self-pay | Admitting: Family Medicine

## 2019-06-02 ENCOUNTER — Other Ambulatory Visit: Payer: Self-pay | Admitting: Cardiology

## 2019-06-21 ENCOUNTER — Other Ambulatory Visit: Payer: Self-pay | Admitting: Cardiology

## 2019-06-23 ENCOUNTER — Other Ambulatory Visit: Payer: Medicaid Other

## 2019-07-07 ENCOUNTER — Other Ambulatory Visit: Payer: Self-pay | Admitting: Cardiology

## 2019-07-09 ENCOUNTER — Other Ambulatory Visit: Payer: Medicaid Other

## 2019-07-20 ENCOUNTER — Ambulatory Visit: Payer: Medicaid Other

## 2019-07-20 ENCOUNTER — Encounter: Payer: Self-pay | Admitting: Cardiology

## 2019-07-20 ENCOUNTER — Ambulatory Visit (INDEPENDENT_AMBULATORY_CARE_PROVIDER_SITE_OTHER): Payer: Medicaid Other | Admitting: Cardiology

## 2019-07-20 ENCOUNTER — Other Ambulatory Visit: Payer: Self-pay

## 2019-07-20 VITALS — BP 148/78 | HR 69 | Temp 97.6°F | Resp 16 | Ht 66.5 in | Wt 344.2 lb

## 2019-07-20 DIAGNOSIS — R002 Palpitations: Secondary | ICD-10-CM

## 2019-07-20 DIAGNOSIS — I10 Essential (primary) hypertension: Secondary | ICD-10-CM | POA: Diagnosis not present

## 2019-07-20 DIAGNOSIS — F17200 Nicotine dependence, unspecified, uncomplicated: Secondary | ICD-10-CM | POA: Diagnosis not present

## 2019-07-20 MED ORDER — AMLODIPINE BESYLATE 10 MG PO TABS: 10.0000 mg | ORAL_TABLET | Freq: Every day | ORAL | 2 refills | Status: DC

## 2019-07-20 NOTE — Progress Notes (Signed)
Primary Physician:  Coralee Rud, DO   Patient ID: Judy Baker, female    DOB: 1971-01-15, 49 y.o.   MRN: [id number]  Subjective:    Chief Complaint  Patient presents with  . Chest Pain    2 month follow up    HPI: Judy Baker  is a 49 y.o. female  with HTN, HLD, obesity, OA, OSA on CPAP, she was initially referred to [place] for evaluation of cardiomyopathy as her brother had recently been diagnosed with potential familial cardiomyopathy.  Due to complaints of nitroglycerin responsive chest pain and mildly abnormal nuclear stress test on 02/22/2019, underwent coronary angiogram in September 2020 revealing normal-appearing arteries.  Symptoms felt to be related to GERD or possible endothelial dysfunction.  At her last office visit, she was started on Imdur as she continued to have nitroglycerin responsive chest pain.  Since last seen by me, she has been doing well.  She states that her chest pain has significantly improved, although did not tolerate Imdur due to headaches.  She has not recently had to take any nitroglycerin.  She is complaining of palpitations.  Reports 2 episodes over the last 7 days that she describes as fluttering sensation that can last for 30 minutes to an hour.  Generally occurring at rest.  She reports that hypertension and hyperlipidemia are well controlled.  Continues to work to lose weight.  Brother was recently diagnosed with familial type of cardiomyopathy. She reports occasional alcoholic drink, but not daily.  She continues to smoke, but is now back on Chantix and is smoking 1 to 2 cigarettes/day.  She states that she is motivated to quit.  Past Medical History:  Diagnosis Date  . Arthritis   . Asthma   . Carpal tunnel syndrome   . GERD (gastroesophageal reflux disease) 02/04/2019  . HLD (hyperlipidemia) 02/04/2019  . HTN (hypertension) 02/04/2019  . Hypertension   . Osteoarthritis     Past Surgical History:  Procedure Laterality Date  . BREAST SURGERY      . CARPAL TUNNEL RELEASE    . CHOLECYSTECTOMY    . LEFT HEART CATH AND CORONARY ANGIOGRAPHY N/A 03/30/2019   Procedure: LEFT HEART CATH AND CORONARY ANGIOGRAPHY;  Surgeon: Yates Decamp, MD;  Location: [facility];  Service: Cardiovascular;  Laterality: N/A;  . TUBAL LIGATION      Social History   Socioeconomic History  . Marital status: Single    Spouse name: Not on file  . Number of children: 2  . Years of education: Not on file  . Highest education level: Not on file  Occupational History  . Not on file  Tobacco Use  . Smoking status: Current Every Day Smoker    Packs/day: 0.25    Years: 20.00    Pack years: 5.00    Types: Cigarettes  . Smokeless tobacco: Never Used  . Tobacco comment: 3 cigs a day  Substance and Sexual Activity  . Alcohol use: Yes    Comment: occas  . Drug use: Never  . Sexual activity: Not on file  Other Topics Concern  . Not on file  Social History Narrative  . Not on file   Social Determinants of Health   Financial Resource Strain:   . Difficulty of Paying Living Expenses: Not on file  Food Insecurity:   . Worried About Programme researcher, broadcasting/film/video in the Last Year: Not on file  . Ran Out of Food in the Last Year: Not on file  Transportation  Needs:   . Lack of Transportation (Medical): Not on file  . Lack of Transportation (Non-Medical): Not on file  Physical Activity:   . Days of Exercise per Week: Not on file  . Minutes of Exercise per Session: Not on file  Stress:   . Feeling of Stress : Not on file  Social Connections:   . Frequency of Communication with Friends and Family: Not on file  . Frequency of Social Gatherings with Friends and Family: Not on file  . Attends Religious Services: Not on file  . Active Member of Clubs or Organizations: Not on file  . Attends Archivist Meetings: Not on file  . Marital Status: Not on file  Intimate Partner Violence:   . Fear of Current or Ex-Partner: Not on file  . Emotionally Abused: Not  on file  . Physically Abused: Not on file  . Sexually Abused: Not on file    Review of Systems  Constitution: Negative for decreased appetite, malaise/fatigue, weight gain and weight loss.  Eyes: Negative for visual disturbance.  Cardiovascular: Positive for dyspnea on exertion and palpitations. Negative for chest pain, claudication, leg swelling, orthopnea and syncope.  Respiratory: Negative for hemoptysis and wheezing.   Endocrine: Negative for cold intolerance and heat intolerance.  Hematologic/Lymphatic: Does not bruise/bleed easily.  Skin: Negative for nail changes.  Musculoskeletal: Negative for muscle weakness and myalgias.  Gastrointestinal: Negative for abdominal pain, change in bowel habit, nausea and vomiting.  Neurological: Negative for difficulty with concentration, dizziness, focal weakness and headaches.  Psychiatric/Behavioral: Negative for altered mental status and suicidal ideas.  All other systems reviewed and are negative.     Objective:  Blood pressure (!) 148/78, pulse 69, temperature 97.6 F (36.4 C), temperature source Temporal, resp. rate 16, height 5' 6.5" (1.689 m), weight (!) 344 lb 3.2 oz (156.1 kg), SpO2 97 %. Body mass index is 54.72 kg/m.    Physical Exam  Constitutional: She is oriented to person, place, and time. Vital signs are normal. She appears well-developed and well-nourished.  Morbidly obese  HENT:  Head: Normocephalic and atraumatic.  Cardiovascular: Normal rate, regular rhythm, normal heart sounds and intact distal pulses.  Pulmonary/Chest: Effort normal and breath sounds normal. No accessory muscle usage. No respiratory distress.  Abdominal: Soft. Bowel sounds are normal.  Musculoskeletal:        General: Normal range of motion.     Cervical back: Normal range of motion.  Neurological: She is alert and oriented to person, place, and time.  Skin: Skin is warm and dry.  Vitals reviewed.  Radiology: No results found.  Laboratory  examination:    CMP Latest Ref Rng & Units 05/13/2019 03/18/2019 03/01/2019  Glucose 70 - 99 mg/dL 96 98 73  BUN 6 - 20 mg/dL 20 15 11   Creatinine 0.44 - 1.00 mg/dL 0.92 0.87 1.04(H)  Sodium 135 - 145 mmol/L 141 140 136  Potassium 3.5 - 5.1 mmol/L 3.3(L) 3.9 3.4(L)  Chloride 98 - 111 mmol/L 105 101 99  CO2 22 - 32 mmol/L 27 26 24   Calcium 8.9 - 10.3 mg/dL 9.6 9.5 9.6   CBC Latest Ref Rng & Units 05/13/2019 03/18/2019 03/01/2019  WBC 4.0 - 10.5 K/uL 7.8 5.4 5.9  Hemoglobin 12.0 - 15.0 g/dL 14.1 14.7 15.5(H)  Hematocrit 36.0 - 46.0 % 43.9 45.2 47.2(H)  Platelets 150 - 400 K/uL 298 262 268   Lipid Panel  No results found for: CHOL, TRIG, HDL, CHOLHDL, VLDL, LDLCALC, LDLDIRECT HEMOGLOBIN A1C No  results found for: HGBA1C, MPG TSH No results for input(s): TSH in the last 8760 hours.  PRN Meds:. Medications Discontinued During This Encounter  Medication Reason  . isosorbide mononitrate (IMDUR) 30 MG 24 hr tablet Error  . amLODipine (NORVASC) 5 MG tablet Dose change   Current Meds  Medication Sig  . albuterol (PROVENTIL) (2.5 MG/3ML) 0.083% nebulizer solution Inhale 2.5 mg into the lungs 3 (three) times daily as needed for wheezing or shortness of breath.   Marland Kitchen azelastine (ASTELIN) 0.1 % nasal spray Place 2 sprays into both nostrils 2 (two) times daily as needed (allergies).   . busPIRone (BUSPAR) 10 MG tablet Take 10 mg by mouth 2 (two) times daily.  . cetirizine HCl (ZYRTEC) 1 MG/ML solution Take 5 mg by mouth daily as needed (allergies.).  Marland Kitchen Cholecalciferol (VITAMIN D-3) 125 MCG (5000 UT) TABS Take 5,000 Units by mouth daily.  . diclofenac sodium (VOLTAREN) 1 % GEL Apply 2-4 g topically 4 (four) times daily as needed (pain.).  Marland Kitchen ELDERBERRY PO Take 1 tablet by mouth daily.  . fluticasone (FLONASE) 50 MCG/ACT nasal spray Place 1 spray into both nostrils 2 (two) times daily as needed for allergies.   . furosemide (LASIX) 20 MG tablet Take 20 mg by mouth daily as needed (fluid retention.).    Marland Kitchen gabapentin (NEURONTIN) 800 MG tablet Take 800 mg by mouth 4 (four) times daily as needed (pain.).   Marland Kitchen HYDROcodone-acetaminophen (NORCO) 10-325 MG tablet Take 1 tablet by mouth 3 (three) times daily as needed (for pain).   . metoprolol succinate (TOPROL-XL) 50 MG 24 hr tablet TAKE ONE TABLET BY MOUTH ONCE DAILY. TAKE WITH OR IMMEDIATELY FOLLOWING A MEAL  . montelukast (SINGULAIR) 10 MG tablet Take 10 mg by mouth at bedtime.  . Multiple Vitamin (MULTIVITAMIN WITH MINERALS) TABS tablet Take 1 tablet by mouth daily. Centrum Silver  . Multiple Vitamins-Minerals (CENTRUM SILVER 50+WOMEN) TABS Take 1 tablet by mouth daily.  . Omega-3 Fatty Acids (FISH OIL) 1000 MG CAPS Take 1,000 mg by mouth 2 (two) times daily.   Marland Kitchen OVER THE COUNTER MEDICATION Take 5 mLs by mouth daily as needed (constipation/energy/various ailments). [place] (1 tsp) as needed  . pantoprazole (PROTONIX) 40 MG tablet TAKE ONE TABLET BY MOUTH TWICE DAILY  . potassium chloride SA (K-DUR) 20 MEQ tablet Take 20 mEq by mouth daily.   . pravastatin (PRAVACHOL) 40 MG tablet Take 40 mg by mouth daily.  . promethazine (PHENERGAN) 25 MG tablet Take 25 mg by mouth every 6 (six) hours as needed for nausea or vomiting.   Marland Kitchen spironolactone-hydrochlorothiazide (ALDACTAZIDE) 25-25 MG tablet Take 1 tablet by mouth daily.  . SYMBICORT 160-4.5 MCG/ACT inhaler Inhale 2 puffs into the lungs 2 (two) times daily.   Marland Kitchen tiZANidine (ZANAFLEX) 4 MG tablet Take 4 mg by mouth 3 (three) times daily as needed for muscle spasms.   . traMADol (ULTRAM) 50 MG tablet Take 50 mg by mouth every 12 (twelve) hours as needed (pain).   . traZODone (DESYREL) 100 MG tablet Take 100 mg by mouth at bedtime.  . varenicline (CHANTIX) 1 MG tablet Take 1 tablet (1 mg total) by mouth 2 (two) times daily.  . vitamin C (ASCORBIC ACID) 500 MG tablet Take 500 mg by mouth daily.  . [DISCONTINUED] amLODipine (NORVASC) 5 MG tablet TAKE ONE TABLET BY MOUTH ONCE DAILY    Cardiac Studies:    Cath 03/30/2019:  LV end diastolic pressure is normal. The left ventricular systolic function is normal.  Recommendation: Patient chest pain could be related to GERD. She may have angina pectoris with normal coronary arteries as well due to endothelial dysfunction. Overall I have normal coronary arteries.  Echocardiogram 01/20/2019: Left ventricle cavity is normal in size. Mild concentric hypertrophy of the left ventricle. Normal LV systolic function with EF 57%. Normal global wall motion. Doppler evidence of grade I (impaired) diastolic dysfunction, normal LAP. Calculated EF 57%. Left atrial cavity is mildly dilated at 3.9 cm. Mild to moderate mitral regurgitation. Mild tricuspid regurgitation. Estimated pulmonary artery systolic pressure is 28 mmHg.  Lexiscan Myoview stress test 02/22/2019: Lexiscan stress test was performed. Stress EKG is non-diagnostic, as this is pharmacological stress test. Normal myocardial thickening with LVEF 50%. SPECT stress and rest images reveal small size, mild intensity, decreased tracer uptake in apical myocardium. In addition, SPECT rest images reveal decreased tracer uptake of medium intensity in basal anteroseptal, inferoseptal myocardium. These findings are most likely due to breast attenuation in a morbidly obese patient. Clinical correlation recommended.  Intermediate risk study.   Assessment:     ICD-10-CM   1. Palpitations  R00.2 Cardiac event monitor  2. Essential hypertension  I10   3. Tobacco use disorder  F17.200   4. Morbid obesity (HCC)  E66.01     EKG 02/05/2019: Normal sinus rhythm at 78 bpm, left atrial enlargement, normal axis, PRWP cannot exclude anterior infarct old. No evidence of ischemia.   Recommendations:   Patient is overall doing well since last seen by me.  She not had any recent episodes of angina.  She is complaining of palpitations that are fairly suggestive of PACs or PVCs; however, she is certainly high risk for  atrial fibrillation and do feel that this should be excluded.  We will place her on 30-day event monitor for further evaluation.  Blood pressure is also elevated today, will increase her amlodipine up to 10 mg daily.  Encouraged her to continue to work on weight loss that can certainly help with this.  She is interested in water aerobics, which I have encouraged her to participate in as I do feel that this would be beneficial.  She has significantly cut back to 1 to 2 cigarettes/day, continue to use Chantix to help with this.  She appears motivated to quit and hopefully will have completely quit by her next office visit.  I will plan to see her back in 6 weeks for follow-up to discuss echocardiogram results, and encouraged her to contact me sooner if needed.   Toniann Fail, MSN, APRN, FNP-C [facility] Office: [phone] Fax: [phone]

## 2019-07-23 ENCOUNTER — Other Ambulatory Visit: Payer: Medicaid Other

## 2019-08-09 ENCOUNTER — Other Ambulatory Visit: Payer: Medicaid Other

## 2019-08-18 ENCOUNTER — Other Ambulatory Visit: Payer: Self-pay | Admitting: Family Medicine

## 2019-08-18 DIAGNOSIS — N6310 Unspecified lump in the right breast, unspecified quadrant: Secondary | ICD-10-CM

## 2019-08-31 ENCOUNTER — Ambulatory Visit: Payer: Medicaid Other | Admitting: Cardiology

## 2019-09-01 ENCOUNTER — Ambulatory Visit
Admission: RE | Admit: 2019-09-01 | Discharge: 2019-09-01 | Disposition: A | Discharge status: Home / Self Care | Payer: Medicaid Other | Source: Ambulatory Visit | Attending: Family Medicine | Admitting: Family Medicine

## 2019-09-01 ENCOUNTER — Other Ambulatory Visit: Payer: Self-pay | Admitting: Family Medicine

## 2019-09-01 ENCOUNTER — Other Ambulatory Visit: Payer: Self-pay

## 2019-09-01 DIAGNOSIS — N6310 Unspecified lump in the right breast, unspecified quadrant: Secondary | ICD-10-CM

## 2019-09-01 DIAGNOSIS — N6320 Unspecified lump in the left breast, unspecified quadrant: Secondary | ICD-10-CM

## 2019-09-07 ENCOUNTER — Telehealth: Payer: Self-pay

## 2019-09-07 NOTE — Telephone Encounter (Signed)
LMOM for pt to see if she was able to come in earlier for her 09/08/2019 appt

## 2019-09-08 ENCOUNTER — Encounter: Payer: Self-pay | Admitting: Cardiology

## 2019-09-08 ENCOUNTER — Other Ambulatory Visit: Payer: Self-pay

## 2019-09-08 ENCOUNTER — Ambulatory Visit: Payer: Medicaid Other | Admitting: Cardiology

## 2019-09-08 VITALS — BP 145/87 | HR 68 | Temp 95.7°F | Ht 66.5 in | Wt 335.0 lb

## 2019-09-08 DIAGNOSIS — I209 Angina pectoris, unspecified: Secondary | ICD-10-CM

## 2019-09-08 DIAGNOSIS — F17200 Nicotine dependence, unspecified, uncomplicated: Secondary | ICD-10-CM

## 2019-09-08 DIAGNOSIS — I493 Ventricular premature depolarization: Secondary | ICD-10-CM

## 2019-09-08 DIAGNOSIS — I10 Essential (primary) hypertension: Secondary | ICD-10-CM

## 2019-09-08 MED ORDER — LOSARTAN POTASSIUM 25 MG PO TABS: 25.0000 mg | ORAL_TABLET | Freq: Every day | ORAL | 2 refills | Status: DC

## 2019-09-08 NOTE — Progress Notes (Signed)
Primary Physician:  Coralee Rud, DO   Patient ID: Judy Baker, female    DOB: 1970-10-15, 49 y.o.   MRN: [id number]  Subjective:    Chief Complaint  Patient presents with  . Hypertension  . Palpitations    HPI: Judy Baker  is a 49 y.o. female  with HTN, HLD, obesity, OA, OSA on CPAP, she was initially referred to [place] for evaluation of cardiomyopathy as her brother had recently been diagnosed with potential familial cardiomyopathy.  Due to complaints of nitroglycerin responsive chest pain and mildly abnormal nuclear stress test on 02/22/2019, underwent coronary angiogram in September 2020 revealing normal-appearing arteries.  Symptoms felt to be related to GERD or possible endothelial dysfunction. Since being on medical therapy, she has had improvement in symptoms. She recently complained of palpitations, was placed on 30 day event monitor, now presents for follow up.  She continues to notice occasional flip-flopping sensation at rest.  She has not had any recurrence of chest pain.  Tolerating medications well.  She has been checking her blood pressure at home, generally systolic readings in the 150s.  She has not had recent labs.  Continues to work to lose weight.  Brother was recently diagnosed with familial type of cardiomyopathy. She reports occasional alcoholic drink, but not daily.  She continues to smoke, but has cut back to 4 cigarettes per day, some days less than this.  She states that she is motivated to quit.  Past Medical History:  Diagnosis Date  . Arthritis   . Asthma   . Carpal tunnel syndrome   . GERD (gastroesophageal reflux disease) 02/04/2019  . HLD (hyperlipidemia) 02/04/2019  . HTN (hypertension) 02/04/2019  . Hypertension   . Osteoarthritis     Past Surgical History:  Procedure Laterality Date  . BREAST SURGERY    . CARPAL TUNNEL RELEASE    . CHOLECYSTECTOMY    . LEFT HEART CATH AND CORONARY ANGIOGRAPHY N/A 03/30/2019   Procedure: LEFT HEART CATH AND  CORONARY ANGIOGRAPHY;  Surgeon: Yates Decamp, MD;  Location: [facility];  Service: Cardiovascular;  Laterality: N/A;  . TUBAL LIGATION      Social History   Socioeconomic History  . Marital status: Single    Spouse name: Not on file  . Number of children: 2  . Years of education: Not on file  . Highest education level: Not on file  Occupational History  . Not on file  Tobacco Use  . Smoking status: Current Every Day Smoker    Packs/day: 0.25    Years: 20.00    Pack years: 5.00    Types: Cigarettes  . Smokeless tobacco: Never Used  . Tobacco comment: 3 cigs a day  Substance and Sexual Activity  . Alcohol use: Yes    Comment: occas  . Drug use: Never  . Sexual activity: Not on file  Other Topics Concern  . Not on file  Social History Narrative  . Not on file   Social Determinants of Health   Financial Resource Strain:   . Difficulty of Paying Living Expenses: Not on file  Food Insecurity:   . Worried About Programme researcher, broadcasting/film/video in the Last Year: Not on file  . Ran Out of Food in the Last Year: Not on file  Transportation Needs:   . Lack of Transportation (Medical): Not on file  . Lack of Transportation (Non-Medical): Not on file  Physical Activity:   . Days of Exercise per Week: Not on file  .  Minutes of Exercise per Session: Not on file  Stress:   . Feeling of Stress : Not on file  Social Connections:   . Frequency of Communication with Friends and Family: Not on file  . Frequency of Social Gatherings with Friends and Family: Not on file  . Attends Religious Services: Not on file  . Active Member of Clubs or Organizations: Not on file  . Attends Archivist Meetings: Not on file  . Marital Status: Not on file  Intimate Partner Violence:   . Fear of Current or Ex-Partner: Not on file  . Emotionally Abused: Not on file  . Physically Abused: Not on file  . Sexually Abused: Not on file    Review of Systems  Constitution: Negative for decreased  appetite, malaise/fatigue, weight gain and weight loss.  Eyes: Negative for visual disturbance.  Cardiovascular: Positive for dyspnea on exertion and palpitations. Negative for chest pain, claudication, leg swelling, orthopnea and syncope.  Respiratory: Negative for hemoptysis and wheezing.   Endocrine: Negative for cold intolerance and heat intolerance.  Hematologic/Lymphatic: Does not bruise/bleed easily.  Skin: Negative for nail changes.  Musculoskeletal: Negative for muscle weakness and myalgias.  Gastrointestinal: Negative for abdominal pain, change in bowel habit, nausea and vomiting.  Neurological: Negative for difficulty with concentration, dizziness, focal weakness and headaches.  Psychiatric/Behavioral: Negative for altered mental status and suicidal ideas.  All other systems reviewed and are negative.     Objective:  Blood pressure (!) 145/87, pulse 68, temperature (!) 95.7 F (35.4 C), height 5' 6.5" (1.689 m), weight (!) 335 lb (152 kg), SpO2 97 %. Body mass index is 53.26 kg/m.    Physical Exam  Constitutional: She is oriented to person, place, and time. Vital signs are normal. She appears well-developed and well-nourished.  Morbidly obese  HENT:  Head: Normocephalic and atraumatic.  Cardiovascular: Normal rate, regular rhythm, normal heart sounds and intact distal pulses.  Pulmonary/Chest: Effort normal and breath sounds normal. No accessory muscle usage. No respiratory distress.  Abdominal: Soft. Bowel sounds are normal.  Musculoskeletal:        General: Normal range of motion.     Cervical back: Normal range of motion.  Neurological: She is alert and oriented to person, place, and time.  Skin: Skin is warm and dry.  Vitals reviewed.  Radiology: No results found.  Laboratory examination:    CMP Latest Ref Rng & Units 05/13/2019 03/18/2019 03/01/2019  Glucose 70 - 99 mg/dL 96 98 73  BUN 6 - 20 mg/dL 20 15 11   Creatinine 0.44 - 1.00 mg/dL 0.92 0.87 1.04(H)    Sodium 135 - 145 mmol/L 141 140 136  Potassium 3.5 - 5.1 mmol/L 3.3(L) 3.9 3.4(L)  Chloride 98 - 111 mmol/L 105 101 99  CO2 22 - 32 mmol/L 27 26 24   Calcium 8.9 - 10.3 mg/dL 9.6 9.5 9.6   CBC Latest Ref Rng & Units 05/13/2019 03/18/2019 03/01/2019  WBC 4.0 - 10.5 K/uL 7.8 5.4 5.9  Hemoglobin 12.0 - 15.0 g/dL 14.1 14.7 15.5(H)  Hematocrit 36.0 - 46.0 % 43.9 45.2 47.2(H)  Platelets 150 - 400 K/uL 298 262 268   Lipid Panel  No results found for: CHOL, TRIG, HDL, CHOLHDL, VLDL, LDLCALC, LDLDIRECT HEMOGLOBIN A1C No results found for: HGBA1C, MPG TSH No results for input(s): TSH in the last 8760 hours.  PRN Meds:. There are no discontinued medications. Current Meds  Medication Sig  . albuterol (PROVENTIL) (2.5 MG/3ML) 0.083% nebulizer solution Inhale 2.5 mg into  the lungs 3 (three) times daily as needed for wheezing or shortness of breath.   Marland Kitchen amLODipine (NORVASC) 10 MG tablet Take 1 tablet (10 mg total) by mouth daily.  Marland Kitchen azelastine (ASTELIN) 0.1 % nasal spray Place 2 sprays into both nostrils 2 (two) times daily as needed (allergies).   . busPIRone (BUSPAR) 10 MG tablet Take 10 mg by mouth 2 (two) times daily.  . cetirizine HCl (ZYRTEC) 1 MG/ML solution Take 5 mg by mouth daily as needed (allergies.).  Marland Kitchen Cholecalciferol (VITAMIN D-3) 125 MCG (5000 UT) TABS Take 5,000 Units by mouth daily.  . diclofenac sodium (VOLTAREN) 1 % GEL Apply 2-4 g topically 4 (four) times daily as needed (pain.).  Marland Kitchen ELDERBERRY PO Take 1 tablet by mouth daily.  . fluticasone (FLONASE) 50 MCG/ACT nasal spray Place 1 spray into both nostrils 2 (two) times daily as needed for allergies.   . furosemide (LASIX) 20 MG tablet Take 20 mg by mouth daily as needed (fluid retention.).   Marland Kitchen gabapentin (NEURONTIN) 800 MG tablet Take 800 mg by mouth 4 (four) times daily as needed (pain.).   Marland Kitchen HYDROcodone-acetaminophen (NORCO) 10-325 MG tablet Take 1 tablet by mouth 3 (three) times daily as needed (for pain).   . metoprolol  succinate (TOPROL-XL) 50 MG 24 hr tablet TAKE ONE TABLET BY MOUTH ONCE DAILY. TAKE WITH OR IMMEDIATELY FOLLOWING A MEAL  . montelukast (SINGULAIR) 10 MG tablet Take 10 mg by mouth at bedtime.  . Multiple Vitamin (MULTIVITAMIN WITH MINERALS) TABS tablet Take 1 tablet by mouth daily. Centrum Silver  . Multiple Vitamins-Minerals (CENTRUM SILVER 50+WOMEN) TABS Take 1 tablet by mouth daily.  . Omega-3 Fatty Acids (FISH OIL) 1000 MG CAPS Take 1,000 mg by mouth 2 (two) times daily.   Marland Kitchen OVER THE COUNTER MEDICATION Take 5 mLs by mouth daily as needed (constipation/energy/various ailments). [place] (1 tsp) as needed  . pantoprazole (PROTONIX) 40 MG tablet TAKE ONE TABLET BY MOUTH TWICE DAILY  . potassium chloride SA (K-DUR) 20 MEQ tablet Take 20 mEq by mouth daily.   . pravastatin (PRAVACHOL) 40 MG tablet Take 40 mg by mouth daily.  . promethazine (PHENERGAN) 25 MG tablet Take 25 mg by mouth every 6 (six) hours as needed for nausea or vomiting.   Marland Kitchen spironolactone-hydrochlorothiazide (ALDACTAZIDE) 25-25 MG tablet Take 1 tablet by mouth daily.  . SYMBICORT 160-4.5 MCG/ACT inhaler Inhale 2 puffs into the lungs 2 (two) times daily.   Marland Kitchen tiZANidine (ZANAFLEX) 4 MG tablet Take 4 mg by mouth 3 (three) times daily as needed for muscle spasms.   . traMADol (ULTRAM) 50 MG tablet Take 50 mg by mouth every 12 (twelve) hours as needed (pain).   . traZODone (DESYREL) 100 MG tablet Take 100 mg by mouth at bedtime.  . vitamin C (ASCORBIC ACID) 500 MG tablet Take 500 mg by mouth daily.  Marland Kitchen zolpidem (AMBIEN) 10 MG tablet Take 10 mg by mouth at bedtime as needed for sleep.    Cardiac Studies:   30 day event monitor 01/19-02/18/2021: Normal sinus rhythm. Average HR 79 bpm. Max HR 158 bpm, Min HR 50 bpm. Symptoms of flutter/skipped beats, shortness of breath, dizziness correlated with Sinus rhythm with rare PVC. 1 episode of flutter/skipped beats, shortness of breath correlated with Sinus tachycardia at 108 bpm. No A fib or SVT  was noted.   Cath 03/30/2019:  LV end diastolic pressure is normal. The left ventricular systolic function is normal.  Recommendation: Patient chest pain could be related  to GERD. She may have angina pectoris with normal coronary arteries as well due to endothelial dysfunction. Overall I have normal coronary arteries.  Lexiscan Myoview stress test 02/22/2019: Lexiscan stress test was performed. Stress EKG is non-diagnostic, as this is pharmacological stress test. Normal myocardial thickening with LVEF 50%. SPECT stress and rest images reveal small size, mild intensity, decreased tracer uptake in apical myocardium. In addition, SPECT rest images reveal decreased tracer uptake of medium intensity in basal anteroseptal, inferoseptal myocardium. These findings are most likely due to breast attenuation in a morbidly obese patient. Clinical correlation recommended.  Intermediate risk study.   Echocardiogram 01/20/2019: Left ventricle cavity is normal in size. Mild concentric hypertrophy of the left ventricle. Normal LV systolic function with EF 57%. Normal global wall motion. Doppler evidence of grade I (impaired) diastolic dysfunction, normal LAP. Calculated EF 57%. Left atrial cavity is mildly dilated at 3.9 cm. Mild to moderate mitral regurgitation. Mild tricuspid regurgitation. Estimated pulmonary artery systolic pressure is 28 mmHg.    Assessment:     ICD-10-CM   1. Essential hypertension  I10 Comprehensive Metabolic Panel (CMET)    Lipid Profile    CBC    TSH  2. Angina pectoris (HCC)  I20.9   3. PVC (premature ventricular contraction)  I49.3   4. Tobacco use disorder  F17.200   5. Morbid obesity (HCC)  E66.01     EKG 02/05/2019: Normal sinus rhythm at 78 bpm, left atrial enlargement, normal axis, PRWP cannot exclude anterior infarct old. No evidence of ischemia.   Recommendations:   Judy Baker  is a 49 y.o. female  with HTN, HLD, obesity, OA, OSA on CPAP, she was initially  referred to [place] for evaluation of cardiomyopathy as her brother had recently been diagnosed with potential familial cardiomyopathy.  Due to complaints of nitroglycerin responsive chest pain and mildly abnormal nuclear stress test on 02/22/2019, underwent coronary angiogram in September 2020 revealing normal-appearing arteries.  Symptoms felt to be related to GERD or possible endothelial dysfunction. Since being on medical therapy, she has had improvement in symptoms. She recently complained of palpitations, was placed on 30 day event monitor, now presents for follow up.  I have reviewed and discussed her recent event monitor results. Symptoms correlated with rare PVC. I have explained etiology for PVC and reassured her. Would recommend continuing with her metoprolol and if she has any worsening symptoms, can potentially further increase this. I suspect that uncontrolled hypertension may also be contributing to this. She is currently not on Ace inhibitor or ARB therapy, will start Losartan 25 mg daily. She is due for labs, will obtain CBC, CMP, Lipids, and TSH in 2 weeks for follow up on kidney function and surveillance. She will be a good candidate for remote blood pressure monitoring and will arrange for this today.  She has not had any recurrent angina symptoms, continue with present medications and risk factor modification. I have urged her to continue to work on weight loss as well as smoking cessation. She has been able to cut back, she appears motivated to quit. I will see her back in 6 weeks for follow up on HTN.   Toniann Fail, MSN, APRN, FNP-C [facility] [place] Office: [phone] Fax: [phone]

## 2019-09-27 ENCOUNTER — Other Ambulatory Visit: Payer: Self-pay | Admitting: Cardiology

## 2019-10-20 ENCOUNTER — Ambulatory Visit: Payer: Medicaid Other | Admitting: Cardiology

## 2019-10-29 ENCOUNTER — Telehealth: Payer: Self-pay | Admitting: Pharmacist

## 2019-10-29 DIAGNOSIS — I209 Angina pectoris, unspecified: Secondary | ICD-10-CM

## 2019-10-29 MED ORDER — NITROGLYCERIN 0.4 MG SL SUBL: 0.4000 mg | SUBLINGUAL_TABLET | SUBLINGUAL | 1 refills | Status: DC | PRN

## 2019-10-29 NOTE — Telephone Encounter (Signed)
BP readings reviewed. BP labile. BP ranging from 125-162/77-108. HR ranging from 76-111. Pt denies any complains of HA, edema, SOB. Pt did have an episode of CP last week where she needed to take 1 dose of NTG. Chest pain resolved following NTG dose. Pt also complaining of feeling lightheaded and dizzy that is worse in the mornings. Pt stated that this is a chronic complain and hasnt noticed any change in the severity or frequency of symptoms recently. Pt denies any recent falls. Encouraged pt to ensure she stabilizes herself and to reduce her risks of falls. Med list reviewed and updated. Pt aware that she has a pending lab order that needs to be completed prior to next OV w/ Dr. Odis Hollingshead on 5/4. Pt planning on going sometime today or early Monday morning.

## 2019-11-02 ENCOUNTER — Ambulatory Visit: Payer: Medicaid Other | Admitting: Cardiology

## 2019-11-05 ENCOUNTER — Other Ambulatory Visit: Payer: Self-pay | Admitting: Orthopedic Surgery

## 2019-11-05 DIAGNOSIS — M25512 Pain in left shoulder: Secondary | ICD-10-CM

## 2019-11-05 LAB — COMPREHENSIVE METABOLIC PANEL
ALT: 26 IU/L (ref 0–32)
AST: 18 IU/L (ref 0–40)
Albumin/Globulin Ratio: 1.5 (ref 1.2–2.2)
Albumin: 4.1 g/dL (ref 3.8–4.8)
Alkaline Phosphatase: 115 IU/L (ref 39–117)
BUN/Creatinine Ratio: 13 (ref 9–23)
BUN: 10 mg/dL (ref 6–24)
Bilirubin Total: 0.3 mg/dL (ref 0.0–1.2)
CO2: 25 mmol/L (ref 20–29)
Calcium: 9.6 mg/dL (ref 8.7–10.2)
Chloride: 102 mmol/L (ref 96–106)
Creatinine, Ser: 0.78 mg/dL (ref 0.57–1.00)
GFR calc Af Amer: 103 mL/min/{1.73_m2} (ref >59)
GFR calc non Af Amer: 89 mL/min/{1.73_m2} (ref >59)
Globulin, Total: 2.8 g/dL (ref 1.5–4.5)
Glucose: 81 mg/dL (ref 65–99)
Potassium: 4.3 mmol/L (ref 3.5–5.2)
Sodium: 142 mmol/L (ref 134–144)
Total Protein: 6.9 g/dL (ref 6.0–8.5)

## 2019-11-05 LAB — LIPID PANEL
Chol/HDL Ratio: 2.9 ratio (ref 0.0–4.4)
Cholesterol, Total: 193 mg/dL (ref 100–199)
HDL: 67 mg/dL (ref >39)
LDL Chol Calc (NIH): 114 mg/dL — ABNORMAL HIGH (ref 0–99)
Triglycerides: 64 mg/dL (ref 0–149)
VLDL Cholesterol Cal: 12 mg/dL (ref 5–40)

## 2019-11-05 LAB — CBC
Hematocrit: 42 % (ref 34.0–46.6)
Hemoglobin: 14.4 g/dL (ref 11.1–15.9)
MCH: 31 pg (ref 26.6–33.0)
MCHC: 34.3 g/dL (ref 31.5–35.7)
MCV: 90 fL (ref 79–97)
Platelets: 294 10*3/uL (ref 150–450)
RBC: 4.65 x10E6/uL (ref 3.77–5.28)
RDW: 12.8 % (ref 11.7–15.4)
WBC: 6.4 10*3/uL (ref 3.4–10.8)

## 2019-11-05 LAB — TSH: TSH: 1.14 u[IU]/mL (ref 0.450–4.500)

## 2019-11-05 NOTE — Progress Notes (Signed)
Normal renal and liver function, normal CBC and thyroid function, LDL still not at goal, will discuss during office visit.

## 2019-11-22 ENCOUNTER — Ambulatory Visit: Payer: Medicaid Other | Admitting: Cardiology

## 2019-12-04 ENCOUNTER — Other Ambulatory Visit: Payer: Self-pay

## 2019-12-04 ENCOUNTER — Ambulatory Visit
Admission: RE | Admit: 2019-12-04 | Discharge: 2019-12-04 | Disposition: A | Discharge status: Home / Self Care | Payer: Medicaid Other | Source: Ambulatory Visit | Attending: Orthopedic Surgery | Admitting: Orthopedic Surgery

## 2019-12-04 DIAGNOSIS — M25512 Pain in left shoulder: Secondary | ICD-10-CM

## 2019-12-08 ENCOUNTER — Telehealth: Payer: Self-pay | Admitting: Pharmacist

## 2019-12-08 NOTE — Telephone Encounter (Addendum)
Called pt regarding elevated BP readings over the past two days. BP readings ranging in 170s/100s. Pt reports to have mild subconjunctival hemorrhage in her eye that she noticed earlier today. Doesn't affect pt's vision and pt reports to mild symptoms. New onset with no prior episodes. Denies any recent trauma or injury to the eye. Reviewed med list together. Pt noted to be self adjusting her amlodipine dose to 5 mg and stopping losartan. Pt self adjusted 1-2 weeks prior. Pt frustrated with the amount of medications that she is on and would like to reduce her pill burden. Reviewed the recent BP readings and the correlation with her current symptoms. Pt interested in bringing her BP lower. Pt willing to take previously prescribed amlodipine 10 mg daily and losartan 25 mg daily. May consider further titrating losartan dose if BP continues to remain elevated. Informed pt to continue monitoring the subconjunctival hemorrhage and to call her PCP if symptoms do not resolve or get worse. May consider combination medications for antihypertensives once BP readings have been more control and stable.

## 2019-12-27 ENCOUNTER — Other Ambulatory Visit: Payer: Medicaid Other

## 2020-01-01 ENCOUNTER — Other Ambulatory Visit: Payer: Medicaid Other

## 2020-01-04 ENCOUNTER — Ambulatory Visit: Payer: Medicaid Other | Admitting: Cardiology

## 2020-01-04 ENCOUNTER — Encounter: Payer: Self-pay | Admitting: Cardiology

## 2020-01-04 ENCOUNTER — Other Ambulatory Visit: Payer: Self-pay

## 2020-01-04 VITALS — BP 141/83 | HR 78 | Ht 66.0 in | Wt 335.0 lb

## 2020-01-04 DIAGNOSIS — I10 Essential (primary) hypertension: Secondary | ICD-10-CM

## 2020-01-04 DIAGNOSIS — E7800 Pure hypercholesterolemia, unspecified: Secondary | ICD-10-CM

## 2020-01-04 DIAGNOSIS — I493 Ventricular premature depolarization: Secondary | ICD-10-CM

## 2020-01-04 DIAGNOSIS — F17200 Nicotine dependence, unspecified, uncomplicated: Secondary | ICD-10-CM

## 2020-01-04 MED ORDER — FUROSEMIDE 20 MG PO TABS: 20.0000 mg | ORAL_TABLET | Freq: Every morning | ORAL | 2 refills | Status: DC

## 2020-01-04 MED ORDER — METOPROLOL SUCCINATE ER 100 MG PO TB24: 100.0000 mg | ORAL_TABLET | Freq: Every evening | ORAL | 0 refills | Status: DC

## 2020-01-04 MED ORDER — LOSARTAN POTASSIUM 50 MG PO TABS: 50.0000 mg | ORAL_TABLET | Freq: Every evening | ORAL | 0 refills | Status: DC

## 2020-01-04 NOTE — Progress Notes (Signed)
Judy Baker Date of Birth: Jan 07, 1971 MRN: [id number] Primary Care Provider:Scheer, Jeneen Rinks, DO Former Cardiology Providers: Jeri Lager, APRN, FNP-C Primary Cardiologist: Rex Kras, DO, [facility] (established care 01/04/2020)  Date: 01/04/20 Last Visit: 09/08/2019  Chief Complaint  Patient presents with  . Hypertension  . Follow-up    6 WEEK     HPI  Judy Baker is a 49 y.o.  female who presents to the office with a chief complaint of " hypertension follow-up." Patient's past medical history and cardiovascular risk factors include: Hypertension, hyperlipidemia, obesity, obstructive sleep apnea on CPAP, active smoking, obesity due to excess calories.  Patient was initially referred to the office for evaluation of cardiomyopathy since her brother was recently diagnosed.  Patient was under the care of Jeri Lager, APRN, FNP-C and is here now to reestablish care and I am seeing her for the first time for evaluation and management of hypertension.  Of note, patient has had an extensive cardiovascular work-up including echocardiogram, stress test, left heart catheterization.  Findings noted below for further reference.  Patient was last seen in the office back in March 2021 for evaluation and management of hypertension.  Currently patient is on multiple antihypertensive medications consistent with resistant hypertension.  She is on spironolactone, hydrochlorothiazide, losartan, metoprolol, Lasix, and Norvasc.  Review of past laboratory work-up does not show any significant work-up for secondary hypertension.  Patient also has palpitations and based on her blood pressure log her systolic blood pressures despite being on multiple medications are not at goal.  Patient does have obstructive sleep apnea and does use her CPAP machine.  Her thyroid function was recently checked and is within normal limits.  Of note patient continues to smoke on a daily basis.  She is no longer using  Chantix.  FUNCTIONAL STATUS: No structured exercise program or daily routine.  ALLERGIES: Allergies  Allergen Reactions  . Penicillins Hives and Swelling    Did it involve swelling of the face/tongue/throat, SOB, or low BP? Yes Did it involve sudden or severe rash/hives, skin peeling, or any reaction on the inside of your mouth or nose? Yes Did you need to seek medical attention at a hospital or doctor's office? Yes When did it last happen? childhood (Teenage) allergy  If all above answers are "NO", may proceed with cephalosporin use.   . Sulfa Antibiotics Swelling     MEDICATION LIST PRIOR TO VISIT: Current Outpatient Medications on File Prior to Visit  Medication Sig Dispense Refill  . albuterol (PROVENTIL) (2.5 MG/3ML) 0.083% nebulizer solution Inhale 2.5 mg into the lungs 3 (three) times daily as needed for wheezing or shortness of breath.     Marland Kitchen amLODipine (NORVASC) 10 MG tablet Take 1 tablet (10 mg total) by mouth daily. (Patient taking differently: Take 10 mg by mouth daily. Taking in the morning) 30 tablet 2  . amLODipine (NORVASC) 5 MG tablet Take 5 mg by mouth daily.    Marland Kitchen azelastine (ASTELIN) 0.1 % nasal spray Place 2 sprays into both nostrils 2 (two) times daily as needed (allergies).     . busPIRone (BUSPAR) 10 MG tablet Take 10 mg by mouth 2 (two) times daily.    . Cholecalciferol (VITAMIN D-3) 125 MCG (5000 UT) TABS Take 5,000 Units by mouth daily.    . diclofenac sodium (VOLTAREN) 1 % GEL Apply 2-4 g topically 4 (four) times daily as needed (pain.).    Marland Kitchen DULoxetine (CYMBALTA) 60 MG capsule Take 60 mg by mouth daily.    Marland Kitchen  fluticasone (FLONASE) 50 MCG/ACT nasal spray Place 1 spray into both nostrils 2 (two) times daily as needed for allergies.     Marland Kitchen HYDROcodone-acetaminophen (NORCO) 10-325 MG tablet Take 1 tablet by mouth 3 (three) times daily as needed (for pain). For rotary cuff pain    . montelukast (SINGULAIR) 10 MG tablet Take 10 mg by mouth at bedtime.    . Multiple  Vitamins-Minerals (CENTRUM SILVER 50+WOMEN) TABS Take 1 tablet by mouth daily.    . nitroGLYCERIN (NITROSTAT) 0.4 MG SL tablet Place 1 tablet (0.4 mg total) under the tongue every 5 (five) minutes as needed for chest pain. 30 tablet 1  . Omega-3 Fatty Acids (FISH OIL) 1000 MG CAPS Take 1,000 mg by mouth 2 (two) times daily.     Marland Kitchen omeprazole (PRILOSEC) 40 MG capsule Take 40 mg by mouth daily.    . pravastatin (PRAVACHOL) 40 MG tablet Take 40 mg by mouth daily.    . promethazine (PHENERGAN) 25 MG tablet Take 25 mg by mouth every 6 (six) hours as needed for nausea or vomiting.     . SYMBICORT 160-4.5 MCG/ACT inhaler Inhale 2 puffs into the lungs 2 (two) times daily.     Marland Kitchen tiZANidine (ZANAFLEX) 4 MG tablet Take 4 mg by mouth 3 (three) times daily as needed for muscle spasms.     . traMADol (ULTRAM) 50 MG tablet Take 50 mg by mouth every 12 (twelve) hours as needed (pain).     . traZODone (DESYREL) 100 MG tablet Take 100 mg by mouth at bedtime.    . vitamin C (ASCORBIC ACID) 500 MG tablet Take 500 mg by mouth daily.     No current facility-administered medications on file prior to visit.    PAST MEDICAL HISTORY: Past Medical History:  Diagnosis Date  . Arthritis   . Asthma   . Carpal tunnel syndrome   . GERD (gastroesophageal reflux disease) 02/04/2019  . HLD (hyperlipidemia) 02/04/2019  . HTN (hypertension) 02/04/2019  . Hypertension   . Osteoarthritis     PAST SURGICAL HISTORY: Past Surgical History:  Procedure Laterality Date  . BREAST SURGERY    . CARPAL TUNNEL RELEASE    . CHOLECYSTECTOMY    . LEFT HEART CATH AND CORONARY ANGIOGRAPHY N/A 03/30/2019   Procedure: LEFT HEART CATH AND CORONARY ANGIOGRAPHY;  Surgeon: Adrian Prows, MD;  Location: [facility];  Service: Cardiovascular;  Laterality: N/A;  . TUBAL LIGATION      FAMILY HISTORY: The patient's family history includes Aneurysm in her mother; Cardiomyopathy in her brother; Heart murmur in her sister; Stroke in her father;  Valvular heart disease in her mother.   SOCIAL HISTORY:  The patient  reports that she has been smoking cigarettes. She has a 5.00 pack-year smoking history. She has never used smokeless tobacco. She reports current alcohol use. She reports that she does not use drugs.  Review of Systems  Constitutional: Negative for chills and fever.  HENT: Negative for hoarse voice and nosebleeds.   Eyes: Negative for discharge, double vision and pain.  Cardiovascular: Positive for dyspnea on exertion and palpitations. Negative for chest pain, claudication, leg swelling, near-syncope, orthopnea, paroxysmal nocturnal dyspnea and syncope.  Respiratory: Negative for hemoptysis and shortness of breath.   Musculoskeletal: Negative for muscle cramps and myalgias.  Gastrointestinal: Negative for abdominal pain, constipation, diarrhea, hematemesis, hematochezia, melena, nausea and vomiting.  Neurological: Negative for dizziness and light-headedness.    PHYSICAL EXAM: Vitals with BMI 01/04/2020 09/08/2019 07/20/2019  Height  _0  5' 6.5" 5' 6.5"  Weight 335 lbs 335 lbs 344 lbs 3 oz  BMI 54.1 70.48 88.91  Systolic 694 503 888  Diastolic 83 87 78  Pulse 78 68 69    CONSTITUTIONAL: Appears older than stated age, obese, hemodynamically stable, no acute distress.   SKIN: Skin is warm and dry. No rash noted. No cyanosis. No pallor. No jaundice HEAD: Normocephalic and atraumatic.  EYES: No scleral icterus MOUTH/THROAT: Moist oral membranes.  NECK: No JVD present. No thyromegaly noted. No carotid bruits  LYMPHATIC: No visible cervical adenopathy.  CHEST Normal respiratory effort. No intercostal retractions  LUNGS: Clear to auscultation bilaterally.  No stridor. No wheezes. No rales.  CARDIOVASCULAR: Regular rate and rhythm, positive S1-S2, no murmurs rubs or gallops appreciated ABDOMINAL: Obese, soft, nontender, nondistended, positive bowel sounds in all 4 quadrants.  No apparent ascites.  EXTREMITIES: No  peripheral edema  HEMATOLOGIC: No significant bruising NEUROLOGIC: Oriented to person, place, and time. Nonfocal. Normal muscle tone.  PSYCHIATRIC: Normal mood and affect. Normal behavior. Cooperative  CARDIAC DATABASE: EKG: 02/05/2019: Normal sinus rhythm at 78 bpm, left atrial enlargement, normal axis, PRWP cannot exclude anterior infarct old. No evidence of ischemia.   Echocardiogram: 01/20/2019: Left ventricle cavity is normal in size. Mild concentric hypertrophy of the left ventricle. Normal LV systolic function with EF 57%. Normal global wall motion. Doppler evidence of grade I (impaired) diastolic dysfunction, normal LAP. Calculated EF 57%. Left atrial cavity is mildly dilated at 3.9 cm. Mild to moderate mitral regurgitation. Mild tricuspid regurgitation. Estimated pulmonary artery systolic pressure is 28 mmHg.  Stress Testing:  Lexiscan Myoview stress test 02/22/2019: Lexiscan stress test was performed. Stress EKG is non-diagnostic, as this is pharmacological stress test. Normal myocardial thickening with LVEF 50%. SPECT stress and rest images reveal small size, mild intensity, decreased tracer uptake in apical myocardium. In addition, SPECT rest images reveal decreased tracer uptake of medium intensity in basal anteroseptal, inferoseptal myocardium. These findings are most likely due to breast attenuation in a morbidly obese patient. Clinical correlation recommended.  Intermediate risk study.  Heart Catheterization: Cath 28/00/3491:  LV end diastolic pressure is normal. The left ventricular systolic function is normal.  Recommendation: Patient chest pain could be related to GERD. She may have angina pectoris with normal coronary arteries as well due to endothelial dysfunction. Overall I have normal coronary arteries  30 day event monitor 01/19-02/18/2021: Normal sinus rhythm. Average HR 79 bpm. Max HR 158 bpm, Min HR 50 bpm. Symptoms of flutter/skipped beats, shortness of  breath, dizziness correlated with Sinus rhythm with rare PVC. 1 episode of flutter/skipped beats, shortness of breath correlated with Sinus tachycardia at 108 bpm. No A fib or SVT was noted  LABORATORY DATA: CBC Latest Ref Rng & Units 11/04/2019 05/13/2019 03/18/2019  WBC 3.4 - 10.8 x10E3/uL 6.4 7.8 5.4  Hemoglobin 11.1 - 15.9 g/dL 14.4 14.1 14.7  Hematocrit 34.0 - 46.6 % 42.0 43.9 45.2  Platelets 150 - 450 x10E3/uL 294 298 262    CMP Latest Ref Rng & Units 11/04/2019 05/13/2019 03/18/2019  Glucose 65 - 99 mg/dL 81 96 98  BUN 6 - 24 mg/dL _1 Creatinine 0.57 - 1.00 mg/dL 0.78 0.92 0.87  Sodium 134 - 144 mmol/L 142 141 140  Potassium 3.5 - 5.2 mmol/L 4.3 3.3(L) 3.9  Chloride 96 - 106 mmol/L 102 105 101  CO2 20 - 29 mmol/L _2 Calcium 8.7 - 10.2 mg/dL 9.6 9.6 9.5  Total  Protein 6.0 - 8.5 g/dL 6.9 - -  Total Bilirubin 0.0 - 1.2 mg/dL 0.3 - -  Alkaline Phos 39 - 117 IU/L 115 - -  AST 0 - 40 IU/L 18 - -  ALT 0 - 32 IU/L 26 - -    Lipid Panel     Component Value Date/Time   CHOL 193 11/04/2019 1546   TRIG 64 11/04/2019 1546   HDL 67 11/04/2019 1546   CHOLHDL 2.9 11/04/2019 1546   LDLCALC 114 (H) 11/04/2019 1546   LABVLDL 12 11/04/2019 1546    No results found for: HGBA1C No components found for: NTPROBNP Lab Results  Component Value Date   TSH 1.140 11/04/2019    Cardiac Panel (last 3 results) No results for input(s): CKTOTAL, CKMB, TROPONINIHS, RELINDX in the last 72 hours.  IMPRESSION:    ICD-10-CM   1. Resistant hypertension  I10 Aldosterone + renin activity w/ ratio    Metanephrines, plasma    Metanephrines, Urine, 24 hour    CMP14+EGFR    Magnesium    Cortisol    VAS US RENAL ARTERY DUPLEX    metoprolol succinate (TOPROL-XL) 100 MG 24 hr tablet    furosemide (LASIX) 20 MG tablet    losartan (COZAAR) 50 MG tablet  2. PVC (premature ventricular contraction)  I49.3   3. Tobacco use disorder  F17.200   4. Pure hypercholesterolemia  E78.00   5. Class 3  severe obesity due to excess calories without serious comorbidity with body mass index (BMI) of 50.0 to 59.9 in adult [facility])  E66.01    Z68.43      RECOMMENDATIONS: Hibo Blasdell is a 49 y.o. female whose past medical history and cardiovascular risk factors include: Hypertension, hyperlipidemia, obesity, obstructive sleep apnea on CPAP, active smoking, obesity due to excess calories.  Resistant hypertension:  Review of electronic medical records does not show any extensive work-up for secondary causes of hypertension.  Hold spironolactone/hydrochlorothiazide and potassium supplements for at least 2 weeks prior to additional labs.  Check Aldo and renin activity.  Plasma metanephrines.  Urine 24-hour metanephrines.  CMP.  Morning cortisol.  Renal duplex to evaluate for renal artery stenosis patient is made aware that this may not be the optimal test given her body habitus but she would like to continue with the diagnostic evaluation that she does not want to be on multiple medications.  Lasix 20 mg in the morning.  Increase Cozaar to 50 mg p.o. every afternoon.  Increase metoprolol 200 mg p.o. every afternoon  Continue with ambulatory blood pressure monitoring.  She is asked to call the office if her systolic blood pressures are consistently greater than 140 mmHg for further medication titration.  PVCs: Patient has history of PVCs and was started on Toprol-XL.  Continue to monitor.  Tobacco use disorder: Patient continues to smoke on a daily basis.  Educated on the importance of complete smoking cessation.  Hyperlipidemia: . Continue statin therapy.   . Currently managed by primary care provider. . Patient denies myalgia or other side effects.  Obesity, due to excess calories: Body mass index is 54.07 kg/m. . I reviewed with the patient the importance of diet, regular physical activity/exercise, weight loss.   . Patient is educated on increasing physical activity  gradually as tolerated.  With the goal of moderate intensity exercise for 30 minutes a day 5 days a week.  Time Spent: 39mn.   FINAL MEDICATION LIST END OF ENCOUNTER: Meds ordered this encounter  Medications  .  metoprolol succinate (TOPROL-XL) 100 MG 24 hr tablet    Sig: Take 1 tablet (100 mg total) by mouth every evening. Take with or immediately following a meal.    Dispense:  30 tablet    Refill:  0  . furosemide (LASIX) 20 MG tablet    Sig: Take 1 tablet (20 mg total) by mouth in the morning.    Dispense:  30 tablet    Refill:  2  . losartan (COZAAR) 50 MG tablet    Sig: Take 1 tablet (50 mg total) by mouth every evening.    Dispense:  30 tablet    Refill:  0    Medications Discontinued During This Encounter  Medication Reason  . pantoprazole (PROTONIX) 40 MG tablet Change in therapy  . varenicline (CHANTIX) 1 MG tablet Patient Preference  . metoprolol succinate (TOPROL-XL) 50 MG 24 hr tablet Dose change  . furosemide (LASIX) 20 MG tablet Dose change  . losartan (COZAAR) 25 MG tablet Dose change  . spironolactone-hydrochlorothiazide (ALDACTAZIDE) 25-25 MG tablet Discontinued by provider  . potassium chloride SA (K-DUR) 20 MEQ tablet Discontinued by provider     Current Outpatient Medications:  .  albuterol (PROVENTIL) (2.5 MG/3ML) 0.083% nebulizer solution, Inhale 2.5 mg into the lungs 3 (three) times daily as needed for wheezing or shortness of breath. , Disp: , Rfl:  .  amLODipine (NORVASC) 10 MG tablet, Take 1 tablet (10 mg total) by mouth daily. (Patient taking differently: Take 10 mg by mouth daily. Taking in the morning), Disp: 30 tablet, Rfl: 2 .  amLODipine (NORVASC) 5 MG tablet, Take 5 mg by mouth daily., Disp: , Rfl:  .  azelastine (ASTELIN) 0.1 % nasal spray, Place 2 sprays into both nostrils 2 (two) times daily as needed (allergies). , Disp: , Rfl:  .  busPIRone (BUSPAR) 10 MG tablet, Take 10 mg by mouth 2 (two) times daily., Disp: , Rfl:  .  Cholecalciferol  (VITAMIN D-3) 125 MCG (5000 UT) TABS, Take 5,000 Units by mouth daily., Disp: , Rfl:  .  diclofenac sodium (VOLTAREN) 1 % GEL, Apply 2-4 g topically 4 (four) times daily as needed (pain.)., Disp: , Rfl:  .  DULoxetine (CYMBALTA) 60 MG capsule, Take 60 mg by mouth daily., Disp: , Rfl:  .  fluticasone (FLONASE) 50 MCG/ACT nasal spray, Place 1 spray into both nostrils 2 (two) times daily as needed for allergies. , Disp: , Rfl:  .  HYDROcodone-acetaminophen (NORCO) 10-325 MG tablet, Take 1 tablet by mouth 3 (three) times daily as needed (for pain). For rotary cuff pain, Disp: , Rfl:  .  montelukast (SINGULAIR) 10 MG tablet, Take 10 mg by mouth at bedtime., Disp: , Rfl:  .  Multiple Vitamins-Minerals (CENTRUM SILVER 50+WOMEN) TABS, Take 1 tablet by mouth daily., Disp: , Rfl:  .  nitroGLYCERIN (NITROSTAT) 0.4 MG SL tablet, Place 1 tablet (0.4 mg total) under the tongue every 5 (five) minutes as needed for chest pain., Disp: 30 tablet, Rfl: 1 .  Omega-3 Fatty Acids (FISH OIL) 1000 MG CAPS, Take 1,000 mg by mouth 2 (two) times daily. , Disp: , Rfl:  .  omeprazole (PRILOSEC) 40 MG capsule, Take 40 mg by mouth daily., Disp: , Rfl:  .  pravastatin (PRAVACHOL) 40 MG tablet, Take 40 mg by mouth daily., Disp: , Rfl:  .  promethazine (PHENERGAN) 25 MG tablet, Take 25 mg by mouth every 6 (six) hours as needed for nausea or vomiting. , Disp: , Rfl:  .  SYMBICORT 160-4.5 MCG/ACT inhaler, Inhale 2 puffs into the lungs 2 (two) times daily. , Disp: , Rfl:  .  tiZANidine (ZANAFLEX) 4 MG tablet, Take 4 mg by mouth 3 (three) times daily as needed for muscle spasms. , Disp: , Rfl:  .  traMADol (ULTRAM) 50 MG tablet, Take 50 mg by mouth every 12 (twelve) hours as needed (pain). , Disp: , Rfl:  .  traZODone (DESYREL) 100 MG tablet, Take 100 mg by mouth at bedtime., Disp: , Rfl:  .  vitamin C (ASCORBIC ACID) 500 MG tablet, Take 500 mg by mouth daily., Disp: , Rfl:  .  furosemide (LASIX) 20 MG tablet, Take 1 tablet (20 mg  total) by mouth in the morning., Disp: 30 tablet, Rfl: 2 .  losartan (COZAAR) 50 MG tablet, Take 1 tablet (50 mg total) by mouth every evening., Disp: 30 tablet, Rfl: 0 .  metoprolol succinate (TOPROL-XL) 100 MG 24 hr tablet, Take 1 tablet (100 mg total) by mouth every evening. Take with or immediately following a meal., Disp: 30 tablet, Rfl: 0  Orders Placed This Encounter  Procedures  . Aldosterone + renin activity w/ ratio  . Metanephrines, plasma  . Metanephrines, Urine, 24 hour  . CMP14+EGFR  . Magnesium  . Cortisol  . VAS US RENAL ARTERY DUPLEX   --Continue cardiac medications as reconciled in final medication list. --Return in about 4 weeks (around 02/01/2020) for BP follow up.. Or sooner if needed. --Continue follow-up with your primary care physician regarding the management of your other chronic comorbid conditions.  Patient's questions and concerns were addressed to her satisfaction. She voices understanding of the instructions provided during this encounter.   This note was created using a voice recognition software as a result there may be grammatical errors inadvertently enclosed that do not reflect the nature of this encounter. Every attempt is made to correct such errors.  Rex Kras, [place], [facility]  Pager: [phone] Office: [phone]

## 2020-01-04 NOTE — Patient Instructions (Addendum)
Hold spironolactone/hydrochlorothiazide Hold Potassium  AM: Norvasc 10 mg p.o. daily  Furosemide 20 mg p.o. every morning  PM: Metoprolol succinate 100 mg p.o. daily Losartan 50 mg p.o. daily   Lab work in 2 weeks, July 21st 2021 at [facility]

## 2020-01-06 ENCOUNTER — Other Ambulatory Visit: Payer: Self-pay | Admitting: Cardiology

## 2020-01-12 ENCOUNTER — Other Ambulatory Visit: Payer: Medicaid Other

## 2020-01-18 ENCOUNTER — Other Ambulatory Visit: Payer: Self-pay

## 2020-01-18 ENCOUNTER — Other Ambulatory Visit: Payer: Self-pay | Admitting: Cardiology

## 2020-01-18 ENCOUNTER — Ambulatory Visit: Payer: Medicaid Other

## 2020-01-18 DIAGNOSIS — I10 Essential (primary) hypertension: Secondary | ICD-10-CM

## 2020-01-18 DIAGNOSIS — I493 Ventricular premature depolarization: Secondary | ICD-10-CM

## 2020-01-18 DIAGNOSIS — R002 Palpitations: Secondary | ICD-10-CM

## 2020-01-19 NOTE — Progress Notes (Signed)
Called and spoke with patient regarding Her duplex.

## 2020-01-21 ENCOUNTER — Other Ambulatory Visit: Payer: Self-pay | Admitting: Cardiology

## 2020-01-21 LAB — CMP14+EGFR
ALT: 20 IU/L
AST: 18 IU/L (ref 0–40)
Albumin/Globulin Ratio: 1.5 (ref 1.2–2.2)
Albumin: 4.1 g/dL
Alkaline Phosphatase: 111 IU/L (ref 48–121)
BUN/Creatinine Ratio: 15
BUN: 14 mg/dL
Bilirubin Total: 0.4 mg/dL (ref 0.0–1.2)
CO2: 21 mmol/L (ref 20–29)
Calcium: 9.9 mg/dL
Chloride: 105 mmol/L (ref 96–106)
Creatinine, Ser: 0.94 mg/dL
Globulin, Total: 2.8 g/dL (ref 1.5–4.5)
Glucose: 88 mg/dL (ref 65–99)
Potassium: 4.6 mmol/L (ref 3.5–5.2)
Sodium: 144 mmol/L (ref 134–144)
Total Protein: 6.9 g/dL (ref 6.0–8.5)

## 2020-01-21 LAB — SPECIMEN STATUS REPORT

## 2020-01-24 ENCOUNTER — Other Ambulatory Visit: Payer: Self-pay | Admitting: Cardiology

## 2020-01-24 LAB — CORTISOL: Cortisol: 10.9 ug/dL

## 2020-01-24 LAB — ALDOSTERONE + RENIN ACTIVITY W/ RATIO: ALDOSTERONE: 3.5 ng/dL (ref 0.0–30.0)

## 2020-01-24 LAB — METANEPHRINES, PLASMA

## 2020-01-24 LAB — MAGNESIUM: Magnesium: 2.1 mg/dL (ref 1.6–2.3)

## 2020-01-25 ENCOUNTER — Other Ambulatory Visit: Payer: Self-pay | Admitting: Orthopedic Surgery

## 2020-01-27 ENCOUNTER — Other Ambulatory Visit: Payer: Medicaid Other

## 2020-01-31 ENCOUNTER — Telehealth: Payer: Self-pay

## 2020-01-31 NOTE — Telephone Encounter (Signed)
Called and reviewed with pt. Pt hasnt restarted the spironolactone-HCTZ since completed the blood work on 7/20. Renin-Aldo cancelled on 01/18/20. Pt stated that she had increased swelling in her legs and dyspnea over the weekend. Restarted spironolactone on her own on Sunday. BP have improved since. Pt stated that her swelling has gotten since restarting spirinolactone-HCTZ; dyspnea has improved as well since Sunday. OV tomorrow on 02/01/20. Will conitnue to monitor and follow up as needed.

## 2020-01-31 NOTE — Telephone Encounter (Signed)
Patient complains of Bp running high for the past couple of days 178/110, 164/99, 171/110, 167/101. Swelling in feet and legs lasix not helping. She took a Holiday representative and it has helped some.

## 2020-02-01 ENCOUNTER — Other Ambulatory Visit: Payer: Self-pay

## 2020-02-01 ENCOUNTER — Ambulatory Visit: Payer: Medicaid Other | Admitting: Cardiology

## 2020-02-01 ENCOUNTER — Encounter: Payer: Self-pay | Admitting: Cardiology

## 2020-02-01 VITALS — BP 152/95 | HR 76 | Resp 16 | Ht 66.0 in | Wt 334.0 lb

## 2020-02-01 DIAGNOSIS — E7800 Pure hypercholesterolemia, unspecified: Secondary | ICD-10-CM

## 2020-02-01 DIAGNOSIS — Z6841 Body Mass Index (BMI) 40.0 and over, adult: Secondary | ICD-10-CM

## 2020-02-01 DIAGNOSIS — I493 Ventricular premature depolarization: Secondary | ICD-10-CM

## 2020-02-01 DIAGNOSIS — F17200 Nicotine dependence, unspecified, uncomplicated: Secondary | ICD-10-CM

## 2020-02-01 DIAGNOSIS — R002 Palpitations: Secondary | ICD-10-CM

## 2020-02-01 DIAGNOSIS — I10 Essential (primary) hypertension: Secondary | ICD-10-CM

## 2020-02-01 MED ORDER — LOSARTAN POTASSIUM 100 MG PO TABS: 100.0000 mg | ORAL_TABLET | Freq: Every day | ORAL | 0 refills | Status: DC

## 2020-02-01 NOTE — Progress Notes (Signed)
Judy Baker Date of Birth: 06/07/1971 MRN: [id number] Primary Care Provider:Scheer, Fayrene Fearing, DO Former Cardiology Providers: [facility] [place], APRN, FNP-C Primary Cardiologist: Tessa Lerner, DO, [facility] (established care 01/04/2020)  Date: 02/01/20 Last Office Visit: 01/04/2020  Chief Complaint  Patient presents with  . Hypertension    HPI  Judy Baker is a 49 y.o.  female who presents to the office with a chief complaint of " hypertension follow-up." Patient's past medical history and cardiovascular risk factors include: Hypertension, hyperlipidemia, obesity, obstructive sleep apnea on CPAP, active smoking, obesity due to excess calories.  Patient was originally under the care of [facility] [place] and transitioned her care to me back in July 2021 for management of hypertension.  At the last office visit we recommended work-up for secondary hypertension.  Unfortunately, majority of the laboratory values are not reported as orders were incomplete/canceled.  Patient's thyroid levels are within normal limits.  Cortisol level within normal.  Renal duplex did not show renal artery stenosis.  Patient's most recent blood pressure log reviewed.  Patient systolic blood pressures were uptrending most likely secondary to her holding spironolactone/hydrochlorothiazide in the anticipation of checking her renin Aldo ratio.  However, she restarted her spironolactone several days ago as she was experiencing shortness of breath and lower extremity swelling.  Patient states that once she started her diuretic therapy her symptoms did improve.  Patient does have obstructive sleep apnea and does use her CPAP machine.  Her thyroid function was recently checked and is within normal limits.  Of note patient continues to smoke on a daily basis.  However, since last visit patient states that she is more determined to completely stop smoking as she is tired of being short of breath.  Patient states that she will be following up  with her PCP for possibly Chantix and/or other pharmacological medications that may help her stop smoking.  FUNCTIONAL STATUS: No structured exercise program or daily routine.  ALLERGIES: Allergies  Allergen Reactions  . Penicillins Hives and Swelling    Did it involve swelling of the face/tongue/throat, SOB, or low BP? Yes Did it involve sudden or severe rash/hives, skin peeling, or any reaction on the inside of your mouth or nose? Yes Did you need to seek medical attention at a hospital or doctor's office? Yes When did it last happen? childhood (Teenage) allergy  If all above answers are "NO", may proceed with cephalosporin use.   . Sulfa Antibiotics Swelling     MEDICATION LIST PRIOR TO VISIT: Current Outpatient Medications on File Prior to Visit  Medication Sig Dispense Refill  . albuterol (PROVENTIL) (2.5 MG/3ML) 0.083% nebulizer solution Inhale 2.5 mg into the lungs 3 (three) times daily as needed for wheezing or shortness of breath.     Marland Kitchen albuterol (VENTOLIN HFA) 108 (90 Base) MCG/ACT inhaler Inhale 2 puffs into the lungs every 6 (six) hours as needed for wheezing or shortness of breath.    Marland Kitchen amLODipine (NORVASC) 10 MG tablet Take 1 tablet (10 mg total) by mouth daily. (Patient taking differently: Take 10 mg by mouth daily. Taking in the morning) 30 tablet 2  . azelastine (ASTELIN) 0.1 % nasal spray Place 2 sprays into both nostrils 2 (two) times daily as needed (allergies).     . busPIRone (BUSPAR) 10 MG tablet Take 10 mg by mouth 2 (two) times daily.    . Cholecalciferol (VITAMIN D-3) 125 MCG (5000 UT) TABS Take 5,000 Units by mouth daily.    . diclofenac sodium (VOLTAREN) 1 %  GEL Apply 2-4 g topically 4 (four) times daily as needed (pain.).    Marland Kitchen DULoxetine (CYMBALTA) 60 MG capsule Take 60 mg by mouth daily.    . fluticasone (FLONASE) 50 MCG/ACT nasal spray Place 1 spray into both nostrils 2 (two) times daily as needed for allergies.     Marland Kitchen HYDROcodone-acetaminophen (NORCO)  10-325 MG tablet Take 1 tablet by mouth 3 (three) times daily as needed (for pain). For rotary cuff pain    . metoprolol succinate (TOPROL-XL) 100 MG 24 hr tablet Take 1 tablet (100 mg total) by mouth every evening. Take with or immediately following a meal. 30 tablet 0  . montelukast (SINGULAIR) 10 MG tablet Take 10 mg by mouth at bedtime.    . Multiple Vitamins-Minerals (CENTRUM SILVER 50+WOMEN) TABS Take 1 tablet by mouth daily.    . nitroGLYCERIN (NITROSTAT) 0.4 MG SL tablet Place 1 tablet (0.4 mg total) under the tongue every 5 (five) minutes as needed for chest pain. 30 tablet 1  . Omega-3 Fatty Acids (FISH OIL) 1000 MG CAPS Take 1,000 mg by mouth 2 (two) times daily.     Marland Kitchen omeprazole (PRILOSEC) 40 MG capsule Take 40 mg by mouth daily.    . potassium chloride SA (KLOR-CON) 20 MEQ tablet     . pravastatin (PRAVACHOL) 40 MG tablet Take 40 mg by mouth daily.    . promethazine (PHENERGAN) 25 MG tablet Take 25 mg by mouth every 6 (six) hours as needed for nausea or vomiting.     Marland Kitchen spironolactone-hydrochlorothiazide (ALDACTAZIDE) 25-25 MG tablet Take 1 tablet by mouth daily. Restarted on 01/31/20    . SYMBICORT 160-4.5 MCG/ACT inhaler Inhale 2 puffs into the lungs 2 (two) times daily.     Marland Kitchen tiZANidine (ZANAFLEX) 4 MG tablet Take 4 mg by mouth 3 (three) times daily as needed for muscle spasms.     . traMADol (ULTRAM) 50 MG tablet Take 50 mg by mouth every 12 (twelve) hours as needed (pain).     . traZODone (DESYREL) 100 MG tablet Take 100 mg by mouth at bedtime.    . vitamin C (ASCORBIC ACID) 500 MG tablet Take 500 mg by mouth as needed.      No current facility-administered medications on file prior to visit.    PAST MEDICAL HISTORY: Past Medical History:  Diagnosis Date  . Arthritis   . Asthma   . Carpal tunnel syndrome   . GERD (gastroesophageal reflux disease) 02/04/2019  . HLD (hyperlipidemia) 02/04/2019  . HTN (hypertension) 02/04/2019  . Hypertension   . Osteoarthritis     PAST  SURGICAL HISTORY: Past Surgical History:  Procedure Laterality Date  . BREAST SURGERY    . CARPAL TUNNEL RELEASE    . CHOLECYSTECTOMY    . LEFT HEART CATH AND CORONARY ANGIOGRAPHY N/A 03/30/2019   Procedure: LEFT HEART CATH AND CORONARY ANGIOGRAPHY;  Surgeon: Yates Decamp, MD;  Location: [facility] INVASIVE CV LAB;  Service: Cardiovascular;  Laterality: N/A;  . TUBAL LIGATION      FAMILY HISTORY: The patient's family history includes Aneurysm in her mother; Cardiomyopathy in her brother; Heart murmur in her sister; Stroke in her father; Valvular heart disease in her mother.   SOCIAL HISTORY:  The patient  reports that she has been smoking cigarettes. She has a 5.00 pack-year smoking history. She has never used smokeless tobacco. She reports current alcohol use. She reports that she does not use drugs.  Review of Systems  Constitutional: Negative for chills and fever.  HENT: Negative for hoarse voice and nosebleeds.   Eyes: Negative for discharge, double vision and pain.  Cardiovascular: Positive for dyspnea on exertion, leg swelling and palpitations. Negative for chest pain, claudication, near-syncope, orthopnea, paroxysmal nocturnal dyspnea and syncope.  Respiratory: Negative for hemoptysis and shortness of breath.   Musculoskeletal: Negative for muscle cramps and myalgias.  Gastrointestinal: Negative for abdominal pain, constipation, diarrhea, hematemesis, hematochezia, melena, nausea and vomiting.  Neurological: Negative for dizziness and light-headedness.    PHYSICAL EXAM: Vitals with BMI 02/01/2020 01/04/2020 09/08/2019  Height 5\' 6"  5\' 6"  5' 6.5"  Weight 334 lbs 335 lbs 335 lbs  BMI 53.93 54.1 53.27  Systolic 152 141  Diastolic 95 83 87  Pulse 76 78 68    CONSTITUTIONAL: Appears older than stated age, obese, hemodynamically stable, no acute distress.   SKIN: Skin is warm and dry. No rash noted. No cyanosis. No pallor. No jaundice HEAD: Normocephalic and atraumatic.  EYES: No  scleral icterus MOUTH/THROAT: Moist oral membranes.  NECK: No JVD present. No thyromegaly noted. No carotid bruits  LYMPHATIC: No visible cervical adenopathy.  CHEST Normal respiratory effort. No intercostal retractions  LUNGS: Clear to auscultation bilaterally.  No stridor. No wheezes. No rales.  CARDIOVASCULAR: Regular rate and rhythm, positive S1-S2, no murmurs rubs or gallops appreciated ABDOMINAL: Obese, soft, nontender, nondistended, positive bowel sounds in all 4 quadrants.  No apparent ascites.  EXTREMITIES: No peripheral edema  HEMATOLOGIC: No significant bruising NEUROLOGIC: Oriented to person, place, and time. Nonfocal. Normal muscle tone.  PSYCHIATRIC: Normal mood and affect. Normal behavior. Cooperative  CARDIAC DATABASE: EKG: 02/01/2020: Normal sinus rhythm, 66 bpm, left atrial enlargement, poor R wave progression, old anteroseptal infarct, without underlying injury pattern.  No significant change compared to prior EKG dated 02/05/2019.  Echocardiogram: 01/20/2019: Left ventricle cavity is normal in size. Mild concentric hypertrophy of the left ventricle. Normal LV systolic function with EF 57%. Normal global wall motion. Doppler evidence of grade I (impaired) diastolic dysfunction, normal LAP. Calculated EF 57%. Left atrial cavity is mildly dilated at 3.9 cm. Mild to moderate mitral regurgitation. Mild tricuspid regurgitation. Estimated pulmonary artery systolic pressure is 28 mmHg.  Stress Testing:  Lexiscan Myoview stress test 02/22/2019: Lexiscan stress test was performed. Stress EKG is non-diagnostic, as this is pharmacological stress test. Normal myocardial thickening with LVEF 50%. SPECT stress and rest images reveal small size, mild intensity, decreased tracer uptake in apical myocardium. In addition, SPECT rest images reveal decreased tracer uptake of medium intensity in basal anteroseptal, inferoseptal myocardium. These findings are most likely due to breast attenuation  in a morbidly obese patient. Clinical correlation recommended.  Intermediate risk study.  Heart Catheterization: Cath 03/30/2019:  LV end diastolic pressure is normal. The left ventricular systolic function is normal.  Recommendation: Patient chest pain could be related to GERD. She may have angina pectoris with normal coronary arteries as well due to endothelial dysfunction. Overall I have normal coronary arteries  30 day event monitor 01/19-02/18/2021: Normal sinus rhythm. Average HR 79 bpm. Max HR 158 bpm, Min HR 50 bpm. Symptoms of flutter/skipped beats, shortness of breath, dizziness correlated with Sinus rhythm with rare PVC. 1 episode of flutter/skipped beats, shortness of breath correlated with Sinus tachycardia at 108 bpm. No A fib or SVT was noted  LABORATORY DATA: CBC Latest Ref Rng & Units 11/04/2019 05/13/2019 03/18/2019  WBC 3.4 - 10.8 x10E3/uL 6.4 7.8 5.4  Hemoglobin 11.1 - 15.9 g/dL 13/06/2019 03/20/2019 12.4  Hematocrit 34.0 - 46.6 % 42.0  43.9 45.2  Platelets 150 - 450 x10E3/uL 294 298 262    CMP Latest Ref Rng & Units 01/18/2020 11/04/2019 05/13/2019  Glucose 65 - 99 mg/dL 88 81 96  BUN mg/dL 14 10 20   Creatinine mg/dL 1.610.94 0.960.78 0.450.92  Sodium 134 - 144 mmol/L 144 142 141  Potassium 3.5 - 5.2 mmol/L 4.6 4.3 3.3(L)  Chloride 96 - 106 mmol/L 105 102 105  CO2 20 - 29 mmol/L 21 25 27   Calcium mg/dL 9.9 9.6 9.6  Total Protein 6.0 - 8.5 g/dL 6.9 6.9 -  Total Bilirubin 0.0 - 1.2 mg/dL 0.4 0.3 -  Alkaline Phos 48 - 121 IU/L 111 115 -  AST 0 - 40 IU/L 18 18 -  ALT IU/L 20 26 -    Lipid Panel     Component Value Date/Time   CHOL 193 11/04/2019 1546   TRIG 64 11/04/2019 1546   HDL 67 11/04/2019 1546   CHOLHDL 2.9 11/04/2019 1546   LDLCALC 114 (H) 11/04/2019 1546   LABVLDL 12 11/04/2019 1546    No results found for: HGBA1C No components found for: NTPROBNP Lab Results  Component Value Date   TSH 1.140 11/04/2019    Cardiac Panel (last 3 results) No results for input(s):  CKTOTAL, CKMB, TROPONINIHS, RELINDX in the last 72 hours.  IMPRESSION:    ICD-10-CM   1. Resistant hypertension  I10 EKG 12-Lead    losartan (COZAAR) 100 MG tablet    Basic metabolic panel    Magnesium    Metanephrines, Urine, 24 hour    Metanephrines, plasma  2. Palpitations  R00.2 LONG TERM MONITOR (3-14 DAYS)  3. PVC (premature ventricular contraction)  I49.3 LONG TERM MONITOR (3-14 DAYS)  4. Tobacco use disorder  F17.200   5. Class 3 severe obesity due to excess calories without serious comorbidity with body mass index (BMI) of 50.0 to 59.9 in adult (HCC)  E66.01    Z68.43   6. Pure hypercholesterolemia  E78.00      RECOMMENDATIONS: Judy Baker is a 49 y.o. female whose past medical history and cardiovascular risk factors include: Hypertension, hyperlipidemia, obesity, obstructive sleep apnea on CPAP, active smoking, obesity due to excess calories.  Resistant hypertension:  Reviewed the results of renal duplex, AM cortisol is within normal limits, TSH within normal limits.   Aldo and renin activity ordered but not done. Will order it again in the future as she has restarted Aldactone.   Plasma metanephrines.  Urine 24-hour metanephrines.  Increase Losartan to 100mg  p.o. every afternoon.  Continue metoprolol 100 mg p.o. every afternoon.  Hold Lasix as the patient states its not effective for her.   Hold Potassium for now until follow up labs in 1 week.   Continue with ambulatory blood pressure monitoring.  She is asked to call the office if her systolic blood pressures are consistently greater than 140 mmHg for further medication titration.  PVCs: Patient has history of PVCs and was started on Toprol-XL.  Continue to have palpitations despite metoprolol. Check 7 day extended Holter to evaluate for arrhythmia.   Tobacco use disorder: Patient continues to smoke on a daily basis.  Educated on the importance of complete smoking cessation.  Hyperlipidemia: . Continue  statin therapy.   . Currently managed by primary care provider. . Patient denies myalgia or other side effects.  Obesity, due to excess calories: Body mass index is 53.91 kg/m. . I reviewed with the patient the importance of diet, regular physical activity/exercise, weight loss.   .Marland Kitchen  Patient is educated on increasing physical activity gradually as tolerated.  With the goal of moderate intensity exercise for 30 minutes a day 5 days a week.  Time Spent: .   FINAL MEDICATION LIST END OF ENCOUNTER: Meds ordered this encounter  Medications  . losartan (COZAAR) 100 MG tablet    Sig: Take 1 tablet (100 mg total) by mouth daily.    Dispense:  90 tablet    Refill:  0    Medications Discontinued During This Encounter  Medication Reason  . furosemide (LASIX) 20 MG tablet Patient Preference  . losartan (COZAAR) 50 MG tablet Dose change     Current Outpatient Medications:  .  albuterol (PROVENTIL) (2.5 MG/3ML) 0.083% nebulizer solution, Inhale 2.5 mg into the lungs 3 (three) times daily as needed for wheezing or shortness of breath. , Disp: , Rfl:  .  albuterol (VENTOLIN HFA) 108 (90 Base) MCG/ACT inhaler, Inhale 2 puffs into the lungs every 6 (six) hours as needed for wheezing or shortness of breath., Disp: , Rfl:  .  amLODipine (NORVASC) 10 MG tablet, Take 1 tablet (10 mg total) by mouth daily. (Patient taking differently: Take 10 mg by mouth daily. Taking in the morning), Disp: 30 tablet, Rfl: 2 .  azelastine (ASTELIN) 0.1 % nasal spray, Place 2 sprays into both nostrils 2 (two) times daily as needed (allergies). , Disp: , Rfl:  .  busPIRone (BUSPAR) 10 MG tablet, Take 10 mg by mouth 2 (two) times daily., Disp: , Rfl:  .  Cholecalciferol (VITAMIN D-3) 125 MCG (5000 UT) TABS, Take 5,000 Units by mouth daily., Disp: , Rfl:  .  diclofenac sodium (VOLTAREN) 1 % GEL, Apply 2-4 g topically 4 (four) times daily as needed (pain.)., Disp: , Rfl:  .  DULoxetine (CYMBALTA) 60 MG capsule, Take 60 mg  by mouth daily., Disp: , Rfl:  .  fluticasone (FLONASE) 50 MCG/ACT nasal spray, Place 1 spray into both nostrils 2 (two) times daily as needed for allergies. , Disp: , Rfl:  .  HYDROcodone-acetaminophen (NORCO) 10-325 MG tablet, Take 1 tablet by mouth 3 (three) times daily as needed (for pain). For rotary cuff pain, Disp: , Rfl:  .  metoprolol succinate (TOPROL-XL) 100 MG 24 hr tablet, Take 1 tablet (100 mg total) by mouth every evening. Take with or immediately following a meal., Disp: 30 tablet, Rfl: 0 .  montelukast (SINGULAIR) 10 MG tablet, Take 10 mg by mouth at bedtime., Disp: , Rfl:  .  Multiple Vitamins-Minerals (CENTRUM SILVER 50+WOMEN) TABS, Take 1 tablet by mouth daily., Disp: , Rfl:  .  nitroGLYCERIN (NITROSTAT) 0.4 MG SL tablet, Place 1 tablet (0.4 mg total) under the tongue every 5 (five) minutes as needed for chest pain., Disp: 30 tablet, Rfl: 1 .  Omega-3 Fatty Acids (FISH OIL) 1000 MG CAPS, Take 1,000 mg by mouth 2 (two) times daily. , Disp: , Rfl:  .  omeprazole (PRILOSEC) 40 MG capsule, Take 40 mg by mouth daily., Disp: , Rfl:  .  potassium chloride SA (KLOR-CON) 20 MEQ tablet, , Disp: , Rfl:  .  pravastatin (PRAVACHOL) 40 MG tablet, Take 40 mg by mouth daily., Disp: , Rfl:  .  promethazine (PHENERGAN) 25 MG tablet, Take 25 mg by mouth every 6 (six) hours as needed for nausea or vomiting. , Disp: , Rfl:  .  spironolactone-hydrochlorothiazide (ALDACTAZIDE) 25-25 MG tablet, Take 1 tablet by mouth daily. Restarted on 01/31/20, Disp: , Rfl:  .  SYMBICORT 160-4.5 MCG/ACT inhaler, Inhale  2 puffs into the lungs 2 (two) times daily. , Disp: , Rfl:  .  tiZANidine (ZANAFLEX) 4 MG tablet, Take 4 mg by mouth 3 (three) times daily as needed for muscle spasms. , Disp: , Rfl:  .  traMADol (ULTRAM) 50 MG tablet, Take 50 mg by mouth every 12 (twelve) hours as needed (pain). , Disp: , Rfl:  .  traZODone (DESYREL) 100 MG tablet, Take 100 mg by mouth at bedtime., Disp: , Rfl:  .  vitamin C (ASCORBIC  ACID) 500 MG tablet, Take 500 mg by mouth as needed. , Disp: , Rfl:  .  losartan (COZAAR) 100 MG tablet, Take 1 tablet (100 mg total) by mouth daily., Disp: 90 tablet, Rfl: 0  Orders Placed This Encounter  Procedures  . Basic metabolic panel  . Magnesium  . Metanephrines, Urine, 24 hour  . Metanephrines, plasma  . LONG TERM MONITOR (3-14 DAYS)  . EKG 12-Lead   --Continue cardiac medications as reconciled in final medication list. --Return in about 4 weeks (around 02/29/2020) for BP follow up.. Or sooner if needed. --Continue follow-up with your primary care physician regarding the management of your other chronic comorbid conditions.  Patient's questions and concerns were addressed to her satisfaction. She voices understanding of the instructions provided during this encounter.   This note was created using a voice recognition software as a result there may be grammatical errors inadvertently enclosed that do not reflect the nature of this encounter. Every attempt is made to correct such errors.  Tessa Lerner, [place], [facility]  Pager: [phone] Office: [phone]

## 2020-02-02 ENCOUNTER — Other Ambulatory Visit: Payer: Self-pay

## 2020-02-02 DIAGNOSIS — I493 Ventricular premature depolarization: Secondary | ICD-10-CM

## 2020-02-02 DIAGNOSIS — R002 Palpitations: Secondary | ICD-10-CM

## 2020-02-12 LAB — BASIC METABOLIC PANEL
BUN/Creatinine Ratio: 18 (ref 9–23)
BUN: 14 mg/dL (ref 6–24)
CO2: 23 mmol/L (ref 20–29)
Calcium: 10 mg/dL (ref 8.7–10.2)
Chloride: 98 mmol/L (ref 96–106)
Creatinine, Ser: 0.8 mg/dL (ref 0.57–1.00)
GFR calc Af Amer: 99 mL/min/{1.73_m2} (ref >59)
GFR calc non Af Amer: 86 mL/min/{1.73_m2} (ref >59)
Glucose: 93 mg/dL (ref 65–99)
Potassium: 3.6 mmol/L (ref 3.5–5.2)
Sodium: 138 mmol/L (ref 134–144)

## 2020-02-12 LAB — MAGNESIUM: Magnesium: 1.9 mg/dL (ref 1.6–2.3)

## 2020-02-17 LAB — METANEPHRINES, PLASMA
Metanephrine, Free: 40.9 pg/mL (ref 0.0–88.0)
Normetanephrine, Free: 144.6 pg/mL — ABNORMAL HIGH (ref 0.0–136.8)

## 2020-02-24 ENCOUNTER — Other Ambulatory Visit: Payer: Self-pay | Admitting: Cardiology

## 2020-02-28 ENCOUNTER — Other Ambulatory Visit: Payer: Self-pay | Admitting: Cardiology

## 2020-02-28 DIAGNOSIS — I10 Essential (primary) hypertension: Secondary | ICD-10-CM

## 2020-02-29 ENCOUNTER — Ambulatory Visit: Payer: Medicaid Other | Admitting: Cardiology

## 2020-03-08 ENCOUNTER — Other Ambulatory Visit: Payer: Medicaid Other

## 2020-03-08 ENCOUNTER — Other Ambulatory Visit: Payer: Self-pay

## 2020-03-08 DIAGNOSIS — Z20822 Contact with and (suspected) exposure to covid-19: Secondary | ICD-10-CM

## 2020-03-10 LAB — NOVEL CORONAVIRUS, NAA: SARS-CoV-2, NAA: NOT DETECTED

## 2020-03-10 LAB — SARS-COV-2, NAA 2 DAY TAT

## 2020-03-13 ENCOUNTER — Encounter: Payer: Self-pay | Admitting: Cardiology

## 2020-03-13 ENCOUNTER — Other Ambulatory Visit: Payer: Self-pay

## 2020-03-13 ENCOUNTER — Ambulatory Visit: Payer: Medicaid Other | Admitting: Cardiology

## 2020-03-13 VITALS — BP 128/90 | HR 62 | Ht 64.0 in | Wt 332.0 lb

## 2020-03-13 DIAGNOSIS — Z6841 Body Mass Index (BMI) 40.0 and over, adult: Secondary | ICD-10-CM

## 2020-03-13 DIAGNOSIS — I493 Ventricular premature depolarization: Secondary | ICD-10-CM

## 2020-03-13 DIAGNOSIS — G4733 Obstructive sleep apnea (adult) (pediatric): Secondary | ICD-10-CM

## 2020-03-13 DIAGNOSIS — I10 Essential (primary) hypertension: Secondary | ICD-10-CM

## 2020-03-13 DIAGNOSIS — F17200 Nicotine dependence, unspecified, uncomplicated: Secondary | ICD-10-CM

## 2020-03-13 DIAGNOSIS — E7800 Pure hypercholesterolemia, unspecified: Secondary | ICD-10-CM

## 2020-03-13 NOTE — Progress Notes (Signed)
Judy OlszewskiAngela Stalvey Date of Birth: 13-Jul-1970 MRN: [id number] Primary Care Provider:Scheer, Fayrene FearingJames, DO Former Cardiology Providers: Altamese CarolinaAshton Kelley, APRN, FNP-C Primary Cardiologist: Tessa LernerSunit Jayvion Stefanski, DO, [facility] (established care 01/04/2020)  Date: 03/13/20 Last Office Visit: 02/01/2020  Chief Complaint  Patient presents with  . Hypertension  . Follow-up    HPI  Judy Baker is a 49 y.o.  female who presents to the office with a chief complaint of " hypertension follow-up." Patient's past medical history and cardiovascular risk factors include: Hypertension, hyperlipidemia, obesity, obstructive sleep apnea on CPAP, active smoking, obesity due to excess calories.  Patient was originally under the care of Altamese Carolinashton Kelley and transitioned her care to me back in July 2021 for management of hypertension.  At the last office visit we recommended work-up for secondary hypertension.  Unfortunately, majority of the laboratory values are not reported as orders were incomplete/canceled.  Patient's thyroid levels are within normal limits.  Cortisol level within normal.  Renal duplex did not show renal artery stenosis.  Patient does have obstructive sleep apnea and does use her CPAP machine.  In the past patient's renin Aldo ratio was not accurately checked and recently her normetanephrine levels in the plasma were mildly elevated compared to the upper limit of normal.  Patient was informed that this could be secondary to her taking Cymbalta.    Of note patient continues to smoke on a daily basis.  However, since last visit patient states that she is more determined to completely stop smoking as she is tired of being short of breath.  Patient states that she has a quit date of March 20, 2020.   Her ambulatory blood pressures are overall improving.  Her average blood pressure is 143/90 and a pulse of 76.  Her office blood pressure today is well controlled at 128/90.  Patient's dyspnea on exertion is also improving  compared to last office visit.  She no longer has lower extremity swelling.  She has lost approximately 2 pounds since last office visit after implementing dietary and lifestyle modifications.  Since last visit she has started walking and doing more yard work which she enjoys.  FUNCTIONAL STATUS: Started to walk more and more active at yard work as well.  ALLERGIES: Allergies  Allergen Reactions  . Penicillins Hives and Swelling    Did it involve swelling of the face/tongue/throat, SOB, or low BP? Yes Did it involve sudden or severe rash/hives, skin peeling, or any reaction on the inside of your mouth or nose? Yes Did you need to seek medical attention at a hospital or doctor's office? Yes When did it last happen? childhood (Teenage) allergy  If all above answers are "NO", may proceed with cephalosporin use.   . Sulfa Antibiotics Swelling     MEDICATION LIST PRIOR TO VISIT: Current Outpatient Medications on File Prior to Visit  Medication Sig Dispense Refill  . albuterol (PROVENTIL) (2.5 MG/3ML) 0.083% nebulizer solution Inhale 2.5 mg into the lungs 3 (three) times daily as needed for wheezing or shortness of breath.     Marland Kitchen. albuterol (VENTOLIN HFA) 108 (90 Base) MCG/ACT inhaler Inhale 2 puffs into the lungs every 6 (six) hours as needed for wheezing or shortness of breath.    Marland Kitchen. amLODipine (NORVASC) 10 MG tablet Take 1 tablet (10 mg total) by mouth daily. (Patient taking differently: Take 10 mg by mouth daily. Taking in the morning) 30 tablet 2  . azelastine (ASTELIN) 0.1 % nasal spray Place 2 sprays into both nostrils 2 (two) times  daily as needed (allergies).     . busPIRone (BUSPAR) 10 MG tablet Take 10 mg by mouth 2 (two) times daily.    . Cholecalciferol (VITAMIN D-3) 125 MCG (5000 UT) TABS Take 5,000 Units by mouth daily.    . diclofenac sodium (VOLTAREN) 1 % GEL Apply 2-4 g topically 4 (four) times daily as needed (pain.).    Marland Kitchen DULoxetine (CYMBALTA) 60 MG capsule Take 60 mg by mouth  daily.    . fluticasone (FLONASE) 50 MCG/ACT nasal spray Place 1 spray into both nostrils 2 (two) times daily as needed for allergies.     Marland Kitchen HYDROcodone-acetaminophen (NORCO) 10-325 MG tablet Take 1 tablet by mouth 3 (three) times daily as needed (for pain). For rotary cuff pain    . losartan (COZAAR) 100 MG tablet TAKE 1 TABLET BY MOUTH EVERY DAY 90 tablet 0  . metoprolol succinate (TOPROL-XL) 100 MG 24 hr tablet Take 1 tablet (100 mg total) by mouth every evening. Take with or immediately following a meal. 30 tablet 0  . montelukast (SINGULAIR) 10 MG tablet Take 10 mg by mouth at bedtime.    . Multiple Vitamins-Minerals (CENTRUM SILVER 50+WOMEN) TABS Take 1 tablet by mouth daily.    . Omega-3 Fatty Acids (FISH OIL) 1000 MG CAPS Take 1,000 mg by mouth 2 (two) times daily.     Marland Kitchen omeprazole (PRILOSEC) 40 MG capsule Take 40 mg by mouth daily.    . pravastatin (PRAVACHOL) 40 MG tablet Take 40 mg by mouth daily.    . promethazine (PHENERGAN) 25 MG tablet Take 25 mg by mouth every 6 (six) hours as needed for nausea or vomiting.     Marland Kitchen spironolactone-hydrochlorothiazide (ALDACTAZIDE) 25-25 MG tablet Take 1 tablet by mouth daily. Restarted on 01/31/20    . SYMBICORT 160-4.5 MCG/ACT inhaler Inhale 2 puffs into the lungs 2 (two) times daily.     Marland Kitchen tiZANidine (ZANAFLEX) 4 MG tablet Take 4 mg by mouth 3 (three) times daily as needed for muscle spasms.     . traMADol (ULTRAM) 50 MG tablet Take 50 mg by mouth every 12 (twelve) hours as needed (pain).     . traZODone (DESYREL) 100 MG tablet Take 100 mg by mouth at bedtime.    . vitamin C (ASCORBIC ACID) 500 MG tablet Take 500 mg by mouth as needed.     . nitroGLYCERIN (NITROSTAT) 0.4 MG SL tablet Place 1 tablet (0.4 mg total) under the tongue every 5 (five) minutes as needed for chest pain. 30 tablet 1   No current facility-administered medications on file prior to visit.    PAST MEDICAL HISTORY: Past Medical History:  Diagnosis Date  . Arthritis   . Asthma    . Carpal tunnel syndrome   . GERD (gastroesophageal reflux disease) 02/04/2019  . HLD (hyperlipidemia) 02/04/2019  . HTN (hypertension) 02/04/2019  . Hypertension   . Osteoarthritis     PAST SURGICAL HISTORY: Past Surgical History:  Procedure Laterality Date  . BREAST SURGERY    . CARPAL TUNNEL RELEASE    . CHOLECYSTECTOMY    . LEFT HEART CATH AND CORONARY ANGIOGRAPHY N/A 03/30/2019   Procedure: LEFT HEART CATH AND CORONARY ANGIOGRAPHY;  Surgeon: Yates Decamp, MD;  Location: [facility];  Service: Cardiovascular;  Laterality: N/A;  . TUBAL LIGATION      FAMILY HISTORY: The patient's family history includes Aneurysm in her mother; Cardiomyopathy in her brother; Heart murmur in her sister; Stroke in her father; Valvular heart disease  in her mother.   SOCIAL HISTORY:  The patient  reports that she has been smoking cigarettes. She has a 5.00 pack-year smoking history. She has never used smokeless tobacco. She reports current alcohol use. She reports that she does not use drugs.  Review of Systems  Constitutional: Negative for chills and fever.  HENT: Negative for hoarse voice and nosebleeds.   Eyes: Negative for discharge, double vision and pain.  Cardiovascular: Positive for dyspnea on exertion (improved). Negative for chest pain, claudication, leg swelling, near-syncope, orthopnea, palpitations, paroxysmal nocturnal dyspnea and syncope.  Respiratory: Negative for hemoptysis and shortness of breath.   Musculoskeletal: Negative for muscle cramps and myalgias.  Gastrointestinal: Negative for abdominal pain, constipation, diarrhea, hematemesis, hematochezia, melena, nausea and vomiting.  Neurological: Negative for dizziness and light-headedness.    PHYSICAL EXAM: Vitals with BMI 03/13/2020 02/01/2020 01/04/2020  Height 5\' 4"  5\' 6"  5\' 6"   Weight 332 lbs 334 lbs 335 lbs  BMI 56.96 53.93 54.1  Systolic 128 152  Diastolic 90 95 83  Pulse 62 76 78    CONSTITUTIONAL: Appears older  than stated age, obese, hemodynamically stable, no acute distress.   SKIN: Skin is warm and dry. No rash noted. No cyanosis. No pallor. No jaundice HEAD: Normocephalic and atraumatic.  EYES: No scleral icterus MOUTH/THROAT: Moist oral membranes.  NECK: No JVD present. No thyromegaly noted. No carotid bruits  LYMPHATIC: No visible cervical adenopathy.  CHEST Normal respiratory effort. No intercostal retractions  LUNGS: Clear to auscultation bilaterally.  No stridor. No wheezes. No rales.  CARDIOVASCULAR: Regular rate and rhythm, positive S1-S2, no murmurs rubs or gallops appreciated ABDOMINAL: Obese, soft, nontender, nondistended, positive bowel sounds in all 4 quadrants.  No apparent ascites.  EXTREMITIES: No peripheral edema  HEMATOLOGIC: No significant bruising NEUROLOGIC: Oriented to person, place, and time. Nonfocal. Normal muscle tone.  PSYCHIATRIC: Normal mood and affect. Normal behavior. Cooperative  CARDIAC DATABASE: EKG: 02/01/2020: Normal sinus rhythm, 66 bpm, left atrial enlargement, poor R wave progression, old anteroseptal infarct, without underlying injury pattern.  No significant change compared to prior EKG dated 02/05/2019.  Echocardiogram: 01/20/2019: Left ventricle cavity is normal in size. Mild concentric hypertrophy of the left ventricle. Normal LV systolic function with EF 57%. Normal global wall motion. Doppler evidence of grade I (impaired) diastolic dysfunction, normal LAP. Calculated EF 57%. Left atrial cavity is mildly dilated at 3.9 cm. Mild to moderate mitral regurgitation. Mild tricuspid regurgitation. Estimated pulmonary artery systolic pressure is 28 mmHg.  Stress Testing:  Lexiscan Myoview stress test 02/22/2019: Lexiscan stress test was performed. Stress EKG is non-diagnostic, as this is pharmacological stress test. Normal myocardial thickening with LVEF 50%. SPECT stress and rest images reveal small size, mild intensity, decreased tracer uptake in apical  myocardium. In addition, SPECT rest images reveal decreased tracer uptake of medium intensity in basal anteroseptal, inferoseptal myocardium. These findings are most likely due to breast attenuation in a morbidly obese patient. Clinical correlation recommended.  Intermediate risk study.  Heart Catheterization: Cath 03/30/2019:  LV end diastolic pressure is normal. The left ventricular systolic function is normal.  Overall normal coronary arteries  30 day event monitor 01/19-02/18/2021: Normal sinus rhythm. Average HR 79 bpm. Max HR 158 bpm, Min HR 50 bpm. Symptoms of flutter/skipped beats, shortness of breath, dizziness correlated with Sinus rhythm with rare PVC. 1 episode of flutter/skipped beats, shortness of breath correlated with Sinus tachycardia at 108 bpm. No A fib or SVT was noted  LABORATORY DATA: CBC Latest Ref Rng &  Units 11/04/2019 05/13/2019 03/18/2019  WBC 3.4 - 10.8 x10E3/uL 6.4 7.8 5.4  Hemoglobin 11.1 - 15.9 g/dL 32.2 02.5 42.7  Hematocrit 34.0 - 46.6 % 42.0 43.9 45.2  Platelets 150 - 450 x10E3/uL 294 298 262    CMP Latest Ref Rng & Units 02/11/2020 01/18/2020 11/04/2019  Glucose 65 - 99 mg/dL 93 88 81  BUN 6 - 24 mg/dL 14 14 10   Creatinine 0.57 - 1.00 mg/dL 0.62 3.76  Sodium 134 - 144 mmol/L 138 144 142  Potassium 3.5 - 5.2 mmol/L 3.6 4.6 4.3  Chloride 96 - 106 mmol/L 98 105 102  CO2 20 - 29 mmol/L 23 21 25   Calcium 8.7 - 10.2 mg/dL 2.83 9.9 9.6  Total Protein 6.0 - 8.5 g/dL - 6.9 6.9  Total Bilirubin 0.0 - 1.2 mg/dL - 0.4 0.3  Alkaline Phos 48 - 121 IU/L - 111 115  AST 0 - 40 IU/L - 18 18  ALT IU/L - 20 26    Lipid Panel     Component Value Date/Time   CHOL 193 11/04/2019 1546   TRIG 64 11/04/2019 1546   HDL 67 11/04/2019 1546   CHOLHDL 2.9 11/04/2019 1546   LDLCALC 114 (H) 11/04/2019 1546   LABVLDL 12 11/04/2019 1546    No results found for: HGBA1C No components found for: NTPROBNP Lab Results  Component Value Date   TSH 1.140 11/04/2019     Cardiac Panel (last 3 results) No results for input(s): CKTOTAL, CKMB, TROPONINIHS, RELINDX in the last 72 hours.  IMPRESSION:    ICD-10-CM   1. Resistant hypertension  I10   2. PVC (premature ventricular contraction)  I49.3   3. Tobacco use disorder  F17.200   4. Class 3 severe obesity due to excess calories without serious comorbidity with body mass index (BMI) of 50.0 to 59.9 in adult (HCC)  E66.01    Z68.43   5. Pure hypercholesterolemia  E78.00   6. OSA on CPAP  G47.33    Z99.89      RECOMMENDATIONS: Judy Baker is a 49 y.o. female whose past medical history and cardiovascular risk factors include: Hypertension, hyperlipidemia, obesity, obstructive sleep apnea on CPAP, active smoking, obesity due to excess calories.  Resistant hypertension:  In the work-up of secondary hypertension patient has undergone a renal duplex, sleep study, a.m. cortisol in the past was noted to be within normal limits, TSH within normal limits.    Attempted to perform Aldo and renin ratio but have been unsuccessful as the patient need to start on spironolactone due to lower extremity swelling or labs are not drawn correctly.    Patient had plasma metanephrines done without urine 24-hour collection.  Her plasma normetanephrine's were slightly elevated this may be a false positive as the patient is on Cymbalta which may falsely positively elevate the levels.    I have requested that the patient that she see an endocrinologist to complete the work-up of her secondary hypertension so that the labs are drawn in a more accurate fashion to improve diagnostic accuracy.  Patient states that she has an appointment with her PCP later this week on Thursday and will ask for referral for endocrinology.    Patient has tolerated the medication changes performed at last office visit with better blood pressure improvement.    Continue ambulatory blood pressure monitoring.   PVCs: Patient has history of PVCs and  was started on Toprol-XL.  In the past that shared decision was to proceed with 7-day  extended Holter monitor to evaluate for other arrhythmias.  But patient states that in the interim her symptoms have improved and therefore wishes to hold off.  We will continue to monitor.    Tobacco use disorder: Patient continues to smoke on a daily basis.  Educated on the importance of complete smoking cessation.  Patient states that she has a stop date of March 20, 2020.  Hyperlipidemia: . Continue statin therapy.   . Currently managed by primary care provider. . Patient denies myalgia or other side effects.  Obesity, due to excess calories: Body mass index is 56.99 kg/m. . I reviewed with the patient the importance of diet, regular physical activity/exercise, weight loss.   . Patient is educated on increasing physical activity gradually as tolerated.  With the goal of moderate intensity exercise for 30 minutes a day 5 days a week.  Time Spent: .   FINAL MEDICATION LIST END OF ENCOUNTER: No orders of the defined types were placed in this encounter.    Current Outpatient Medications:  .  albuterol (PROVENTIL) (2.5 MG/3ML) 0.083% nebulizer solution, Inhale 2.5 mg into the lungs 3 (three) times daily as needed for wheezing or shortness of breath. , Disp: , Rfl:  .  albuterol (VENTOLIN HFA) 108 (90 Base) MCG/ACT inhaler, Inhale 2 puffs into the lungs every 6 (six) hours as needed for wheezing or shortness of breath., Disp: , Rfl:  .  amLODipine (NORVASC) 10 MG tablet, Take 1 tablet (10 mg total) by mouth daily. (Patient taking differently: Take 10 mg by mouth daily. Taking in the morning), Disp: 30 tablet, Rfl: 2 .  azelastine (ASTELIN) 0.1 % nasal spray, Place 2 sprays into both nostrils 2 (two) times daily as needed (allergies). , Disp: , Rfl:  .  busPIRone (BUSPAR) 10 MG tablet, Take 10 mg by mouth 2 (two) times daily., Disp: , Rfl:  .  Cholecalciferol (VITAMIN D-3) 125 MCG (5000 UT) TABS,  Take 5,000 Units by mouth daily., Disp: , Rfl:  .  diclofenac sodium (VOLTAREN) 1 % GEL, Apply 2-4 g topically 4 (four) times daily as needed (pain.)., Disp: , Rfl:  .  DULoxetine (CYMBALTA) 60 MG capsule, Take 60 mg by mouth daily., Disp: , Rfl:  .  fluticasone (FLONASE) 50 MCG/ACT nasal spray, Place 1 spray into both nostrils 2 (two) times daily as needed for allergies. , Disp: , Rfl:  .  HYDROcodone-acetaminophen (NORCO) 10-325 MG tablet, Take 1 tablet by mouth 3 (three) times daily as needed (for pain). For rotary cuff pain, Disp: , Rfl:  .  losartan (COZAAR) 100 MG tablet, TAKE 1 TABLET BY MOUTH EVERY DAY, Disp: 90 tablet, Rfl: 0 .  metoprolol succinate (TOPROL-XL) 100 MG 24 hr tablet, Take 1 tablet (100 mg total) by mouth every evening. Take with or immediately following a meal., Disp: 30 tablet, Rfl: 0 .  montelukast (SINGULAIR) 10 MG tablet, Take 10 mg by mouth at bedtime., Disp: , Rfl:  .  Multiple Vitamins-Minerals (CENTRUM SILVER 50+WOMEN) TABS, Take 1 tablet by mouth daily., Disp: , Rfl:  .  Omega-3 Fatty Acids (FISH OIL) 1000 MG CAPS, Take 1,000 mg by mouth 2 (two) times daily. , Disp: , Rfl:  .  omeprazole (PRILOSEC) 40 MG capsule, Take 40 mg by mouth daily., Disp: , Rfl:  .  pravastatin (PRAVACHOL) 40 MG tablet, Take 40 mg by mouth daily., Disp: , Rfl:  .  promethazine (PHENERGAN) 25 MG tablet, Take 25 mg by mouth every 6 (six) hours as  needed for nausea or vomiting. , Disp: , Rfl:  .  spironolactone-hydrochlorothiazide (ALDACTAZIDE) 25-25 MG tablet, Take 1 tablet by mouth daily. Restarted on 01/31/20, Disp: , Rfl:  .  SYMBICORT 160-4.5 MCG/ACT inhaler, Inhale 2 puffs into the lungs 2 (two) times daily. , Disp: , Rfl:  .  tiZANidine (ZANAFLEX) 4 MG tablet, Take 4 mg by mouth 3 (three) times daily as needed for muscle spasms. , Disp: , Rfl:  .  traMADol (ULTRAM) 50 MG tablet, Take 50 mg by mouth every 12 (twelve) hours as needed (pain). , Disp: , Rfl:  .  traZODone (DESYREL) 100 MG  tablet, Take 100 mg by mouth at bedtime., Disp: , Rfl:  .  vitamin C (ASCORBIC ACID) 500 MG tablet, Take 500 mg by mouth as needed. , Disp: , Rfl:  .  nitroGLYCERIN (NITROSTAT) 0.4 MG SL tablet, Place 1 tablet (0.4 mg total) under the tongue every 5 (five) minutes as needed for chest pain., Disp: 30 tablet, Rfl: 1  No orders of the defined types were placed in this encounter.  --Continue cardiac medications as reconciled in final medication list. --Return in about 6 months (around 09/10/2020) for Reevaluation of Resistant Hypertension. . Or sooner if needed. --Continue follow-up with your primary care physician regarding the management of your other chronic comorbid conditions.  Patient's questions and concerns were addressed to her satisfaction. She voices understanding of the instructions provided during this encounter.   This note was created using a voice recognition software as a result there may be grammatical errors inadvertently enclosed that do not reflect the nature of this encounter. Every attempt is made to correct such errors.  Tessa Lerner, [place], [facility]  Pager: [phone] Office: [phone]

## 2020-03-16 ENCOUNTER — Other Ambulatory Visit: Payer: Self-pay | Admitting: Cardiology

## 2020-05-19 DIAGNOSIS — M1712 Unilateral primary osteoarthritis, left knee: Secondary | ICD-10-CM | POA: Insufficient documentation

## 2020-06-19 ENCOUNTER — Other Ambulatory Visit: Payer: Self-pay | Admitting: Family Medicine

## 2020-06-19 DIAGNOSIS — N63 Unspecified lump in unspecified breast: Secondary | ICD-10-CM

## 2020-07-28 ENCOUNTER — Ambulatory Visit
Admission: RE | Admit: 2020-07-28 | Discharge: 2020-07-28 | Disposition: A | Discharge status: Home / Self Care | Payer: Medicaid Other | Source: Ambulatory Visit | Attending: Family Medicine | Admitting: Family Medicine

## 2020-07-28 ENCOUNTER — Other Ambulatory Visit: Payer: Self-pay

## 2020-07-28 DIAGNOSIS — N6310 Unspecified lump in the right breast, unspecified quadrant: Secondary | ICD-10-CM

## 2020-07-28 DIAGNOSIS — N63 Unspecified lump in unspecified breast: Secondary | ICD-10-CM

## 2020-09-11 ENCOUNTER — Ambulatory Visit: Payer: Medicaid Other | Admitting: Cardiology

## 2020-09-14 ENCOUNTER — Encounter: Payer: Self-pay | Admitting: Pharmacist

## 2020-09-14 NOTE — Progress Notes (Signed)
CARE PLAN ENTRY  09/14/2020 Name: Judy Baker MRN: [id number] DOB: 1970-12-21  Judy Baker is enrolled in Remote Patient Monitoring/Principle Care Monitoring.  Date of Enrollment: 09/08/19 Supervising physician: Tessa Lerner Indication: HTN  Remote Readings: Not Compliant and Avg BP: 157/100, HR:89  Next scheduled OV: 09/18/20  Pharmacist Clinical Goal(s):  Marland Kitchen Over the next 90 days, patient will demonstrate Improved medication adherence as evidenced by medication fill history . Over the next 90 days, patient will demonstrate improved understanding of prescribed medications and rationale for usage as evidenced by patient teach back . Over the next 90 days, patient will experience decrease in ED visits. ED visits in last 6 months = 0 . Over the next 90 days, patient will not experience hospital admission. Hospital Admissions in last 6 months = 0  Interventions: . Provider and Inter-disciplinary care team collaboration (see longitudinal plan of care) . Comprehensive medication review performed. . Discussed plans with patient for ongoing care management follow up and provided patient with direct contact information for care management team . Collaboration with provider re: medication management  Patient Self Care Activities:  . Self administers medications as prescribed . Attends all scheduled provider appointments . Performs ADL's independently . Performs IADL's independently  Patient Active Problem List   Diagnosis Date Noted  . Angina pectoris (HCC) 03/30/2019  . GERD (gastroesophageal reflux disease) 02/04/2019  . HLD (hyperlipidemia) 02/04/2019  . HTN (hypertension) 02/04/2019   Past Surgical History:  Procedure Laterality Date  . BREAST BIOPSY Right 12/21/2018   FIBROADENOMA/PASH  . BREAST SURGERY    . CARPAL TUNNEL RELEASE    . CHOLECYSTECTOMY    . LEFT HEART CATH AND CORONARY ANGIOGRAPHY N/A 03/30/2019   Procedure: LEFT HEART CATH AND CORONARY ANGIOGRAPHY;   Surgeon: Yates Decamp, MD;  Location: [facility];  Service: Cardiovascular;  Laterality: N/A;  . TUBAL LIGATION     Social History   Socioeconomic History  . Marital status: Single    Spouse name: Not on file  . Number of children: 2  . Years of education: Not on file  . Highest education level: Not on file  Occupational History  . Not on file  Tobacco Use  . Smoking status: Current Every Day Smoker    Packs/day: 0.25    Years: 20.00    Pack years: 5.00    Types: Cigarettes  . Smokeless tobacco: Never Used  . Tobacco comment: 3 cigs a day  Vaping Use  . Vaping Use: Never used  Substance and Sexual Activity  . Alcohol use: Yes    Comment: occas  . Drug use: Never  . Sexual activity: Not on file  Other Topics Concern  . Not on file  Social History Narrative  . Not on file   Social Determinants of Health   Financial Resource Strain: Not on file  Food Insecurity: Not on file  Transportation Needs: Not on file  Physical Activity: Not on file  Stress: Not on file  Social Connections: Not on file   Family History  Problem Relation Age of Onset  . Aneurysm Mother   . Valvular heart disease Mother   . Stroke Father   . Heart murmur Sister   . Cardiomyopathy Brother    Allergies  Allergen Reactions  . Penicillins Hives and Swelling    Did it involve swelling of the face/tongue/throat, SOB, or low BP? Yes Did it involve sudden or severe rash/hives, skin peeling, or any reaction on the inside of your  mouth or nose? Yes Did you need to seek medical attention at a hospital or doctor's office? Yes When did it last happen? childhood (Teenage) allergy  If all above answers are "NO", may proceed with cephalosporin use.   . Sulfa Antibiotics Swelling   Outpatient Encounter Medications as of 09/14/2020  Medication Sig  . albuterol (PROVENTIL) (2.5 MG/3ML) 0.083% nebulizer solution Inhale 2.5 mg into the lungs 3 (three) times daily as needed for wheezing or shortness of  breath.   Marland Kitchen albuterol (VENTOLIN HFA) 108 (90 Base) MCG/ACT inhaler Inhale 2 puffs into the lungs every 6 (six) hours as needed for wheezing or shortness of breath.  Marland Kitchen amLODipine (NORVASC) 10 MG tablet Take 1 tablet (10 mg total) by mouth daily. (Patient taking differently: Take 10 mg by mouth daily. Taking in the morning)  . azelastine (ASTELIN) 0.1 % nasal spray Place 2 sprays into both nostrils 2 (two) times daily as needed (allergies).   . busPIRone (BUSPAR) 10 MG tablet Take 10 mg by mouth 2 (two) times daily.  . Cholecalciferol (VITAMIN D-3) 125 MCG (5000 UT) TABS Take 5,000 Units by mouth daily.  . diclofenac sodium (VOLTAREN) 1 % GEL Apply 2-4 g topically 4 (four) times daily as needed (pain.).  Marland Kitchen DULoxetine (CYMBALTA) 60 MG capsule Take 60 mg by mouth daily.  . fluticasone (FLONASE) 50 MCG/ACT nasal spray Place 1 spray into both nostrils 2 (two) times daily as needed for allergies.   Marland Kitchen HYDROcodone-acetaminophen (NORCO) 10-325 MG tablet Take 1 tablet by mouth 3 (three) times daily as needed (for pain). For rotary cuff pain  . losartan (COZAAR) 100 MG tablet TAKE 1 TABLET BY MOUTH EVERY DAY  . metoprolol succinate (TOPROL-XL) 100 MG 24 hr tablet Take 1 tablet (100 mg total) by mouth every evening. Take with or immediately following a meal.  . montelukast (SINGULAIR) 10 MG tablet Take 10 mg by mouth at bedtime.  . Multiple Vitamins-Minerals (CENTRUM SILVER 50+WOMEN) TABS Take 1 tablet by mouth daily.  . nitroGLYCERIN (NITROSTAT) 0.4 MG SL tablet Place 1 tablet (0.4 mg total) under the tongue every 5 (five) minutes as needed for chest pain.  . Omega-3 Fatty Acids (FISH OIL) 1000 MG CAPS Take 1,000 mg by mouth 2 (two) times daily.   Marland Kitchen omeprazole (PRILOSEC) 40 MG capsule Take 40 mg by mouth daily.  . pravastatin (PRAVACHOL) 40 MG tablet Take 40 mg by mouth daily.  . promethazine (PHENERGAN) 25 MG tablet Take 25 mg by mouth every 6 (six) hours as needed for nausea or vomiting.   Marland Kitchen  spironolactone-hydrochlorothiazide (ALDACTAZIDE) 25-25 MG tablet Take 1 tablet by mouth daily. Restarted on 01/31/20  . SYMBICORT 160-4.5 MCG/ACT inhaler Inhale 2 puffs into the lungs 2 (two) times daily.   Marland Kitchen tiZANidine (ZANAFLEX) 4 MG tablet Take 4 mg by mouth 3 (three) times daily as needed for muscle spasms.   . traMADol (ULTRAM) 50 MG tablet Take 50 mg by mouth every 12 (twelve) hours as needed (pain).   . traZODone (DESYREL) 100 MG tablet Take 100 mg by mouth at bedtime.  . vitamin C (ASCORBIC ACID) 500 MG tablet Take 500 mg by mouth as needed.    No facility-administered encounter medications on file as of 09/14/2020.   Patient Care Team    Relationship Specialty Notifications Start End  Coralee Rud, DO PCP - General Family Medicine  12/23/18       Hypertension   BP goal is:  <130/80  Office blood pressures are  BP Readings from Last 3 Encounters:  03/13/20 128/90  02/01/20 (!) 152/95  01/04/20 (!) 141/83    Patient is currently uncontrolled on the following medications: amlodipine 10 mg, losartan 100 mg, metoprolol 100 mg, spironolactone-HCTZ 25/25 mg  Patient checks BP at home daily  Patient home BP readings are ranging: 157/100  Patient has failed these meds in the past: lasix, isordil,   We discussed diet and exercise extensively  Plan  Continue current medications and control with diet and exercise  ______________ Visit Information SDOH (Social Determinants of Health) assessments performed: Yes.  Judy Baker was given information about Principle Care Management/Remote Patient Monitoring services today including:  1. RPM/PCM service includes personalized support from designated clinical staff supervised by her physician, including individualized plan of care and coordination with other care providers 2. 24/7 contact phone numbers for assistance for urgent and routine care needs. 3. Standard insurance, coinsurance, copays and deductibles apply for principle care  management only during months in which we provide at least 30 minutes of these services. Most insurances cover these services at 100%, however patients may be responsible for any copay, coinsurance and/or deductible if applicable. This service may help you avoid the need for more expensive face-to-face services. 4. Only one practitioner may furnish and bill the service in a calendar month. 5. The patient may stop PCM/RPM services at any time (effective at the end of the month) by phone call to the office staff.  Patient agreed to services and verbal consent obtained.   Cassell Clement, Pharm.D. Clinical Pharmacist [facility] [phone]

## 2020-09-18 ENCOUNTER — Ambulatory Visit: Payer: Medicaid Other | Admitting: Cardiology

## 2020-09-18 NOTE — Progress Notes (Deleted)
Judy Baker Date of Birth: [birth year] MRN: [id number] Primary Care Provider:Scheer, Fayrene Fearing, DO Former Cardiology Providers: Altamese Fyffe, APRN, FNP-C Primary Cardiologist: Tessa Lerner, DO, [facility] (established care 01/04/2020)    No chief complaint on file.   HPI  Judy Baker is a 50 y.o.  female who presents to the office with a chief complaint of " ***." Patient's past medical history and cardiovascular risk factors include: Hypertension, hyperlipidemia, obesity, obstructive sleep apnea on CPAP, active smoking, obesity due to excess calories.   FUNCTIONAL STATUS: Started to walk more and more active at yard work as well.  ALLERGIES: Allergies  Allergen Reactions  . Penicillins Hives and Swelling    Did it involve swelling of the face/tongue/throat, SOB, or low BP? Yes Did it involve sudden or severe rash/hives, skin peeling, or any reaction on the inside of your mouth or nose? Yes Did you need to seek medical attention at a hospital or doctor's office? Yes When did it last happen? childhood (Teenage) allergy  If all above answers are "NO", may proceed with cephalosporin use.   . Sulfa Antibiotics Swelling     MEDICATION LIST PRIOR TO VISIT: Current Outpatient Medications on File Prior to Visit  Medication Sig Dispense Refill  . albuterol (PROVENTIL) (2.5 MG/3ML) 0.083% nebulizer solution Inhale 2.5 mg into the lungs 3 (three) times daily as needed for wheezing or shortness of breath.     Marland Kitchen albuterol (VENTOLIN HFA) 108 (90 Base) MCG/ACT inhaler Inhale 2 puffs into the lungs every 6 (six) hours as needed for wheezing or shortness of breath.    Marland Kitchen amLODipine (NORVASC) 10 MG tablet Take 1 tablet (10 mg total) by mouth daily. (Patient taking differently: Take 10 mg by mouth daily. Taking in the morning) 30 tablet 2  . azelastine (ASTELIN) 0.1 % nasal spray Place 2 sprays into both nostrils 2 (two) times daily as needed (allergies).     . busPIRone (BUSPAR) 10 MG tablet Take 10  mg by mouth 2 (two) times daily.    . Cholecalciferol (VITAMIN D-3) 125 MCG (5000 UT) TABS Take 5,000 Units by mouth daily.    . diclofenac sodium (VOLTAREN) 1 % GEL Apply 2-4 g topically 4 (four) times daily as needed (pain.).    Marland Kitchen DULoxetine (CYMBALTA) 60 MG capsule Take 60 mg by mouth daily.    . fluticasone (FLONASE) 50 MCG/ACT nasal spray Place 1 spray into both nostrils 2 (two) times daily as needed for allergies.     Marland Kitchen HYDROcodone-acetaminophen (NORCO) 10-325 MG tablet Take 1 tablet by mouth 3 (three) times daily as needed (for pain). For rotary cuff pain    . losartan (COZAAR) 100 MG tablet TAKE 1 TABLET BY MOUTH EVERY DAY 90 tablet 0  . metoprolol succinate (TOPROL-XL) 100 MG 24 hr tablet Take 1 tablet (100 mg total) by mouth every evening. Take with or immediately following a meal. 30 tablet 0  . montelukast (SINGULAIR) 10 MG tablet Take 10 mg by mouth at bedtime.    . Multiple Vitamins-Minerals (CENTRUM SILVER 50+WOMEN) TABS Take 1 tablet by mouth daily.    . nitroGLYCERIN (NITROSTAT) 0.4 MG SL tablet Place 1 tablet (0.4 mg total) under the tongue every 5 (five) minutes as needed for chest pain. 30 tablet 1  . Omega-3 Fatty Acids (FISH OIL) 1000 MG CAPS Take 1,000 mg by mouth 2 (two) times daily.     Marland Kitchen omeprazole (PRILOSEC) 40 MG capsule Take 40 mg by mouth daily.    . pravastatin (  PRAVACHOL) 40 MG tablet Take 40 mg by mouth daily.    . promethazine (PHENERGAN) 25 MG tablet Take 25 mg by mouth every 6 (six) hours as needed for nausea or vomiting.     Marland Kitchen spironolactone-hydrochlorothiazide (ALDACTAZIDE) 25-25 MG tablet Take 1 tablet by mouth daily. Restarted on 01/31/20    . SYMBICORT 160-4.5 MCG/ACT inhaler Inhale 2 puffs into the lungs 2 (two) times daily.     Marland Kitchen tiZANidine (ZANAFLEX) 4 MG tablet Take 4 mg by mouth 3 (three) times daily as needed for muscle spasms.     . traMADol (ULTRAM) 50 MG tablet Take 50 mg by mouth every 12 (twelve) hours as needed (pain).     . traZODone (DESYREL)  100 MG tablet Take 100 mg by mouth at bedtime.    . vitamin C (ASCORBIC ACID) 500 MG tablet Take 500 mg by mouth as needed.      No current facility-administered medications on file prior to visit.    PAST MEDICAL HISTORY: Past Medical History:  Diagnosis Date  . Arthritis   . Asthma   . Carpal tunnel syndrome   . GERD (gastroesophageal reflux disease) 02/04/2019  . HLD (hyperlipidemia) 02/04/2019  . HTN (hypertension) 02/04/2019  . Hypertension   . Osteoarthritis     PAST SURGICAL HISTORY: Past Surgical History:  Procedure Laterality Date  . BREAST BIOPSY Right 12/21/2018   FIBROADENOMA/PASH  . BREAST SURGERY    . CARPAL TUNNEL RELEASE    . CHOLECYSTECTOMY    . LEFT HEART CATH AND CORONARY ANGIOGRAPHY N/A 03/30/2019   Procedure: LEFT HEART CATH AND CORONARY ANGIOGRAPHY;  Surgeon: Yates Decamp, MD;  Location: [facility];  Service: Cardiovascular;  Laterality: N/A;  . TUBAL LIGATION      FAMILY HISTORY: The patient's family history includes Aneurysm in her mother; Cardiomyopathy in her brother; Heart murmur in her sister; Stroke in her father; Valvular heart disease in her mother.   SOCIAL HISTORY:  The patient  reports that she has been smoking cigarettes. She has a 5.00 pack-year smoking history. She has never used smokeless tobacco. She reports current alcohol use. She reports that she does not use drugs.  Review of Systems  Constitutional: Negative for chills and fever.  HENT: Negative for hoarse voice and nosebleeds.   Eyes: Negative for discharge, double vision and pain.  Cardiovascular: Positive for dyspnea on exertion (improved). Negative for chest pain, claudication, leg swelling, near-syncope, orthopnea, palpitations, paroxysmal nocturnal dyspnea and syncope.  Respiratory: Negative for hemoptysis and shortness of breath.   Musculoskeletal: Negative for muscle cramps and myalgias.  Gastrointestinal: Negative for abdominal pain, constipation, diarrhea, hematemesis,  hematochezia, melena, nausea and vomiting.  Neurological: Negative for dizziness and light-headedness.    PHYSICAL EXAM: Vitals with BMI 03/13/2020 02/01/2020 01/04/2020  Height 5\' 4"  5\' 6"  5\' 6"   Weight 332 lbs 334 lbs 335 lbs  BMI 56.96 53.93 54.1  Systolic 128 152  Diastolic 90 95 83  Pulse 62 76 78    CONSTITUTIONAL: Appears older than stated age, obese, hemodynamically stable, no acute distress.   SKIN: Skin is warm and dry. No rash noted. No cyanosis. No pallor. No jaundice HEAD: Normocephalic and atraumatic.  EYES: No scleral icterus MOUTH/THROAT: Moist oral membranes.  NECK: No JVD present. No thyromegaly noted. No carotid bruits  LYMPHATIC: No visible cervical adenopathy.  CHEST Normal respiratory effort. No intercostal retractions  LUNGS: Clear to auscultation bilaterally.  No stridor. No wheezes. No rales.  CARDIOVASCULAR: Regular rate  and rhythm, positive S1-S2, no murmurs rubs or gallops appreciated ABDOMINAL: Obese, soft, nontender, nondistended, positive bowel sounds in all 4 quadrants.  No apparent ascites.  EXTREMITIES: No peripheral edema  HEMATOLOGIC: No significant bruising NEUROLOGIC: Oriented to person, place, and time. Nonfocal. Normal muscle tone.  PSYCHIATRIC: Normal mood and affect. Normal behavior. Cooperative  CARDIAC DATABASE: EKG: 03/06/2020:Normal sinus rhythm, 66 bpm, left atrial enlargement, poor R wave progression, old anteroseptal infarct, without underlying injury pattern.  No significant change compared to prior EKG dated 02/05/2019.  Echocardiogram: 01/20/2019: Left ventricle cavity is normal in size. Mild concentric hypertrophy of the left ventricle. Normal LV systolic function with EF 57%. Normal global wall motion. Doppler evidence of grade I (impaired) diastolic dysfunction, normal LAP. Calculated EF 57%. Left atrial cavity is mildly dilated at 3.9 cm. Mild to moderate mitral regurgitation. Mild tricuspid regurgitation. Estimated pulmonary  artery systolic pressure is 28 mmHg.  Stress Testing:  Lexiscan Myoview stress test 02/22/2019: Lexiscan stress test was performed. Stress EKG is non-diagnostic, as this is pharmacological stress test. Normal myocardial thickening with LVEF 50%. SPECT stress and rest images reveal small size, mild intensity, decreased tracer uptake in apical myocardium. In addition, SPECT rest images reveal decreased tracer uptake of medium intensity in basal anteroseptal, inferoseptal myocardium. These findings are most likely due to breast attenuation in a morbidly obese patient. Clinical correlation recommended.  Intermediate risk study.  Heart Catheterization: Cath 03/30/2019:  LV end diastolic pressure is normal. The left ventricular systolic function is normal.  Overall normal coronary arteries  30 day event monitor 01/19-02/18/2021: Normal sinus rhythm. Average HR 79 bpm. Max HR 158 bpm, Min HR 50 bpm. Symptoms of flutter/skipped beats, shortness of breath, dizziness correlated with Sinus rhythm with rare PVC. 1 episode of flutter/skipped beats, shortness of breath correlated with Sinus tachycardia at 108 bpm. No A fib or SVT was noted  LABORATORY DATA: CBC Latest Ref Rng & Units 11/04/2019 05/13/2019 03/18/2019  WBC 3.4 - 10.8 x10E3/uL 6.4 7.8 5.4  Hemoglobin 11.1 - 15.9 g/dL 16.1 09.6 04.5  Hematocrit 34.0 - 46.6 % 42.0 43.9 45.2  Platelets 150 - 450 x10E3/uL 294 298 262    CMP Latest Ref Rng & Units 02/11/2020 01/18/2020 11/04/2019  Glucose 65 - 99 mg/dL 93 88 81  BUN 6 - 24 mg/dL Creatinine 0.57 - 1.00 mg/dL 4.09 8.11 9.14  Sodium 134 - 144 mmol/L 138 144 142  Potassium 3.5 - 5.2 mmol/L 3.6 4.6 4.3  Chloride 96 - 106 mmol/L 98 105 102  CO2 20 - 29 mmol/L Calcium 8.7 - 10.2 mg/dL 78.2 9.9 9.6  Total Protein 6.0 - 8.5 g/dL - 6.9 6.9  Total Bilirubin 0.0 - 1.2 mg/dL - 0.4 0.3  Alkaline Phos 48 - 121 IU/L - 111 115  AST 0 - 40 IU/L - 18 18  ALT IU/L - 20 26    Lipid Panel      Component Value Date/Time   CHOL 193 11/04/2019 1546   TRIG 64 11/04/2019 1546   HDL 67 11/04/2019 1546   CHOLHDL 2.9 11/04/2019 1546   LDLCALC 114 (H) 11/04/2019 1546   LABVLDL 12 11/04/2019 1546    No results found for: HGBA1C No components found for: NTPROBNP Lab Results  Component Value Date   TSH 1.140 11/04/2019    Cardiac Panel (last 3 results) No results for input(s): CKTOTAL, CKMB, TROPONINIHS, RELINDX in the last 72 hours.  IMPRESSION:  No diagnosis found.  RECOMMENDATIONS: Judy Baker is a 50 y.o. female whose past medical history and cardiovascular risk factors include: Hypertension, hyperlipidemia, obesity, obstructive sleep apnea on CPAP, active smoking, obesity due to excess calories.  Resistant hypertension:  In the work-up of secondary hypertension patient has undergone a renal duplex, sleep study, a.m. cortisol in the past was noted to be within normal limits, TSH within normal limits.    Attempted to perform Aldo and renin ratio but have been unsuccessful as the patient need to start on spironolactone due to lower extremity swelling or labs are not drawn correctly.    Patient had plasma metanephrines done without urine 24-hour collection.  Her plasma normetanephrine's were slightly elevated this may be a false positive as the patient is on Cymbalta which may falsely positively elevate the levels.    I have requested that the patient that she see an endocrinologist to complete the work-up of her secondary hypertension so that the labs are drawn in a more accurate fashion to improve diagnostic accuracy.  Patient states that she has an appointment with her PCP later this week on Thursday and will ask for referral for endocrinology.    Patient has tolerated the medication changes performed at last office visit with better blood pressure improvement.    Continue ambulatory blood pressure monitoring.   PVCs: Patient has history of PVCs and was started on  Toprol-XL.  In the past that shared decision was to proceed with 7-day extended Holter monitor to evaluate for other arrhythmias.  But patient states that in the interim her symptoms have improved and therefore wishes to hold off.  We will continue to monitor.    Tobacco use disorder: Patient continues to smoke on a daily basis.  Educated on the importance of complete smoking cessation.  Patient states that she has a stop date of March 20, 2020.  Hyperlipidemia: . Continue statin therapy.   . Currently managed by primary care provider. . Patient denies myalgia or other side effects.  Obesity, due to excess calories: There is no height or weight on file to calculate BMI. . I reviewed with the patient the importance of diet, regular physical activity/exercise, weight loss.   . Patient is educated on increasing physical activity gradually as tolerated.  With the goal of moderate intensity exercise for 30 minutes a day 5 days a week.  Time Spent: 30min.   FINAL MEDICATION LIST END OF ENCOUNTER: No orders of the defined types were placed in this encounter.    Current Outpatient Medications:  .  albuterol (PROVENTIL) (2.5 MG/3ML) 0.083% nebulizer solution, Inhale 2.5 mg into the lungs 3 (three) times daily as needed for wheezing or shortness of breath. , Disp: , Rfl:  .  albuterol (VENTOLIN HFA) 108 (90 Base) MCG/ACT inhaler, Inhale 2 puffs into the lungs every 6 (six) hours as needed for wheezing or shortness of breath., Disp: , Rfl:  .  amLODipine (NORVASC) 10 MG tablet, Take 1 tablet (10 mg total) by mouth daily. (Patient taking differently: Take 10 mg by mouth daily. Taking in the morning), Disp: 30 tablet, Rfl: 2 .  azelastine (ASTELIN) 0.1 % nasal spray, Place 2 sprays into both nostrils 2 (two) times daily as needed (allergies). , Disp: , Rfl:  .  busPIRone (BUSPAR) 10 MG tablet, Take 10 mg by mouth 2 (two) times daily., Disp: , Rfl:  .  Cholecalciferol (VITAMIN D-3) 125 MCG (5000 UT)  TABS, Take 5,000 Units by mouth daily., Disp: , Rfl:  .  diclofenac sodium (VOLTAREN) 1 % GEL, Apply 2-4 g topically 4 (four) times daily as needed (pain.)., Disp: , Rfl:  .  DULoxetine (CYMBALTA) 60 MG capsule, Take 60 mg by mouth daily., Disp: , Rfl:  .  fluticasone (FLONASE) 50 MCG/ACT nasal spray, Place 1 spray into both nostrils 2 (two) times daily as needed for allergies. , Disp: , Rfl:  .  HYDROcodone-acetaminophen (NORCO) 10-325 MG tablet, Take 1 tablet by mouth 3 (three) times daily as needed (for pain). For rotary cuff pain, Disp: , Rfl:  .  losartan (COZAAR) 100 MG tablet, TAKE 1 TABLET BY MOUTH EVERY DAY, Disp: 90 tablet, Rfl: 0 .  metoprolol succinate (TOPROL-XL) 100 MG 24 hr tablet, Take 1 tablet (100 mg total) by mouth every evening. Take with or immediately following a meal., Disp: 30 tablet, Rfl: 0 .  montelukast (SINGULAIR) 10 MG tablet, Take 10 mg by mouth at bedtime., Disp: , Rfl:  .  Multiple Vitamins-Minerals (CENTRUM SILVER 50+WOMEN) TABS, Take 1 tablet by mouth daily., Disp: , Rfl:  .  nitroGLYCERIN (NITROSTAT) 0.4 MG SL tablet, Place 1 tablet (0.4 mg total) under the tongue every 5 (five) minutes as needed for chest pain., Disp: 30 tablet, Rfl: 1 .  Omega-3 Fatty Acids (FISH OIL) 1000 MG CAPS, Take 1,000 mg by mouth 2 (two) times daily. , Disp: , Rfl:  .  omeprazole (PRILOSEC) 40 MG capsule, Take 40 mg by mouth daily., Disp: , Rfl:  .  pravastatin (PRAVACHOL) 40 MG tablet, Take 40 mg by mouth daily., Disp: , Rfl:  .  promethazine (PHENERGAN) 25 MG tablet, Take 25 mg by mouth every 6 (six) hours as needed for nausea or vomiting. , Disp: , Rfl:  .  spironolactone-hydrochlorothiazide (ALDACTAZIDE) 25-25 MG tablet, Take 1 tablet by mouth daily. Restarted on 01/31/20, Disp: , Rfl:  .  SYMBICORT 160-4.5 MCG/ACT inhaler, Inhale 2 puffs into the lungs 2 (two) times daily. , Disp: , Rfl:  .  tiZANidine (ZANAFLEX) 4 MG tablet, Take 4 mg by mouth 3 (three) times daily as needed for muscle  spasms. , Disp: , Rfl:  .  traMADol (ULTRAM) 50 MG tablet, Take 50 mg by mouth every 12 (twelve) hours as needed (pain). , Disp: , Rfl:  .  traZODone (DESYREL) 100 MG tablet, Take 100 mg by mouth at bedtime., Disp: , Rfl:  .  vitamin C (ASCORBIC ACID) 500 MG tablet, Take 500 mg by mouth as needed. , Disp: , Rfl:   No orders of the defined types were placed in this encounter.  --Continue cardiac medications as reconciled in final medication list. --No follow-ups on file. Or sooner if needed. --Continue follow-up with your primary care physician regarding the management of your other chronic comorbid conditions.  Patient's questions and concerns were addressed to her satisfaction. She voices understanding of the instructions provided during this encounter.   This note was created using a voice recognition software as a result there may be grammatical errors inadvertently enclosed that do not reflect the nature of this encounter. Every attempt is made to correct such errors.  Tessa Lerner, [place], [facility]  Pager: [phone] Office: [phone]

## 2020-09-26 ENCOUNTER — Encounter: Payer: Self-pay | Admitting: Cardiology

## 2020-09-26 ENCOUNTER — Ambulatory Visit: Payer: Medicaid Other | Admitting: Cardiology

## 2020-09-26 ENCOUNTER — Other Ambulatory Visit: Payer: Self-pay

## 2020-09-26 VITALS — BP 138/84 | HR 76 | Temp 98.0°F | Resp 16 | Ht 64.0 in | Wt 329.6 lb

## 2020-09-26 DIAGNOSIS — E7800 Pure hypercholesterolemia, unspecified: Secondary | ICD-10-CM

## 2020-09-26 DIAGNOSIS — F17200 Nicotine dependence, unspecified, uncomplicated: Secondary | ICD-10-CM

## 2020-09-26 DIAGNOSIS — I493 Ventricular premature depolarization: Secondary | ICD-10-CM

## 2020-09-26 DIAGNOSIS — G4733 Obstructive sleep apnea (adult) (pediatric): Secondary | ICD-10-CM

## 2020-09-26 DIAGNOSIS — I10 Essential (primary) hypertension: Secondary | ICD-10-CM

## 2020-09-26 IMAGING — MG DIGITAL DIAGNOSTIC BILATERAL MAMMOGRAM WITH TOMO AND CAD
8 of 17 series · 8 of 40 positions shown · non-contrast
Comparison: Previous exam(s).

CLINICAL DATA: Patient complains of a palpable right breast mass.

EXAM:
DIGITAL DIAGNOSTIC BILATERAL MAMMOGRAM WITH CAD AND TOMO
ULTRASOUND RIGHT BREAST

[L CC synth-2D (1 of 2)]
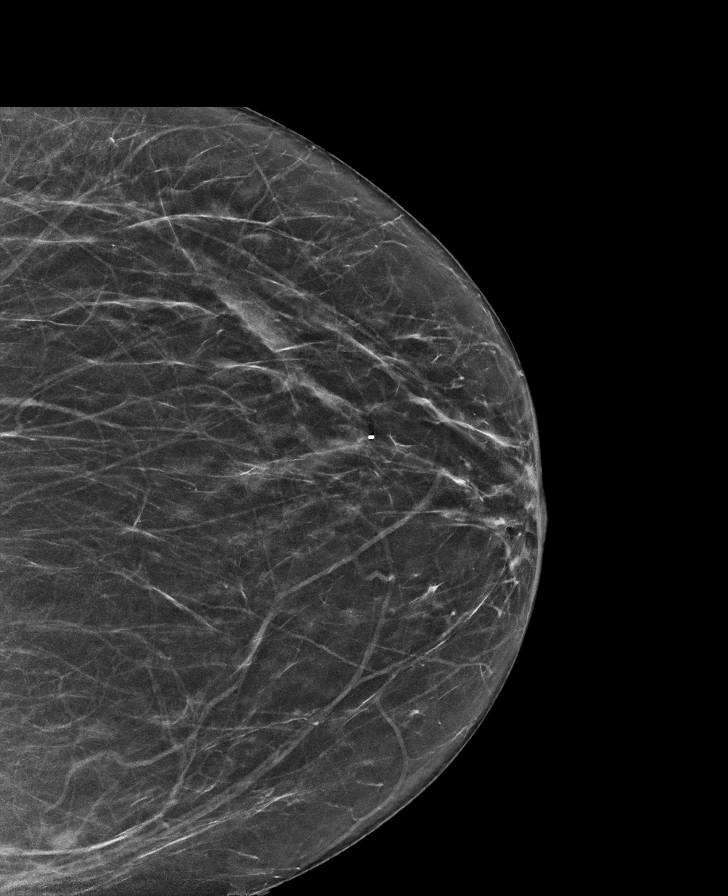

[L ML synth-2D]
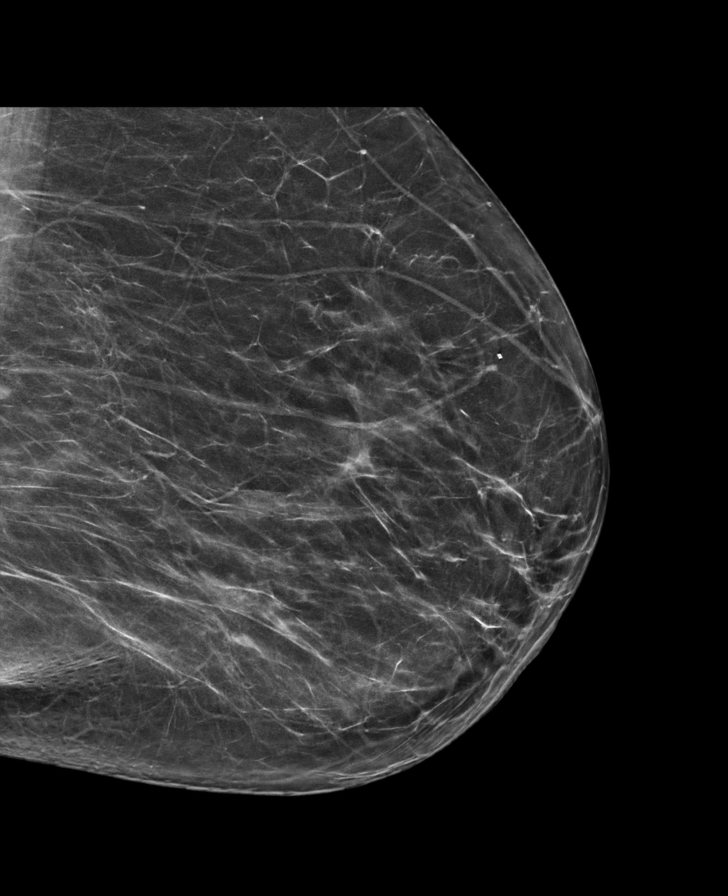

[L CC synth-2D (2 of 2)]
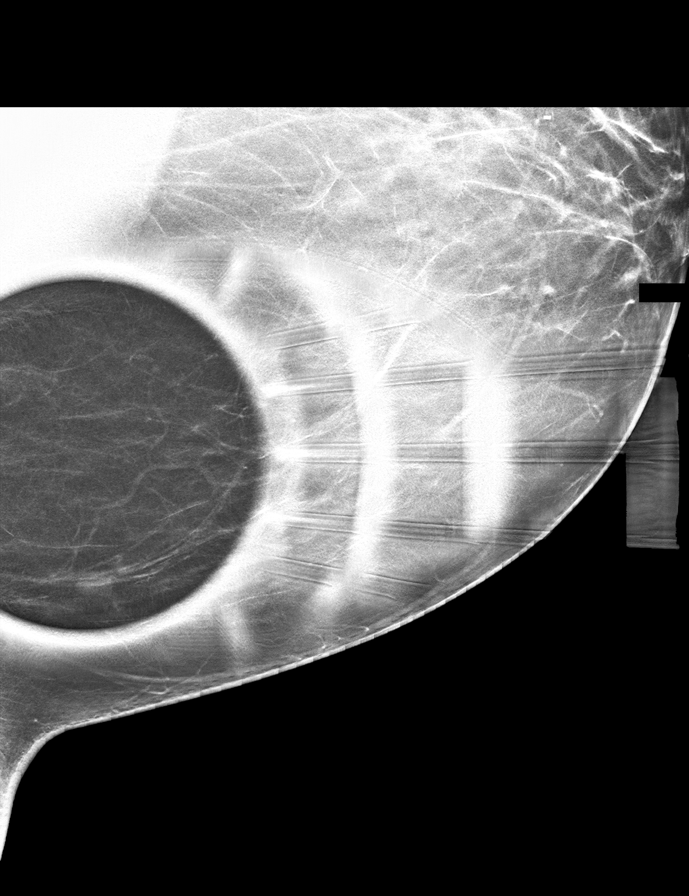

[R CC synth-2D]
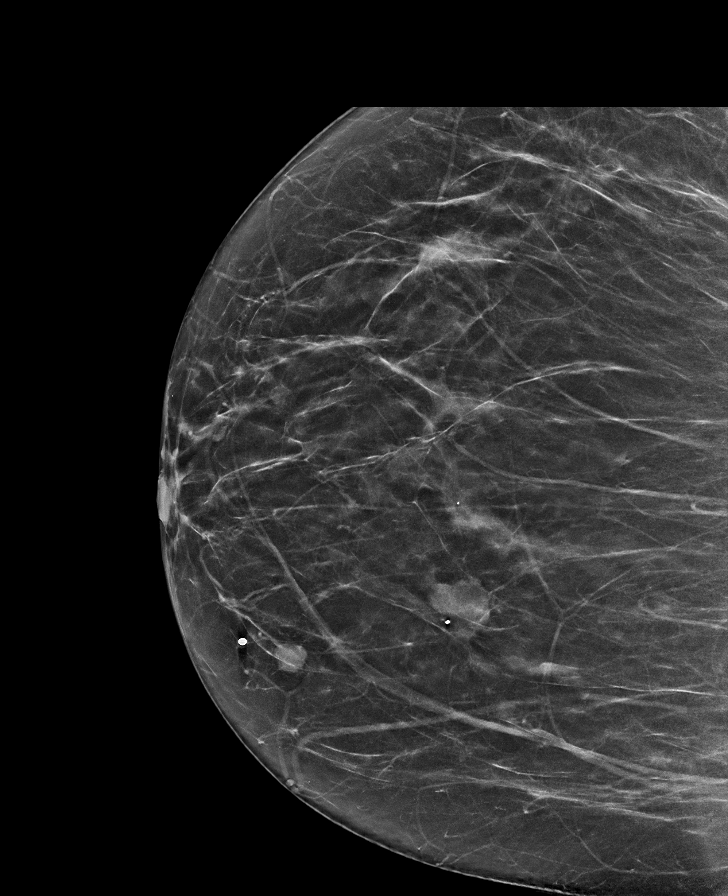

[L MLO synth-2D]
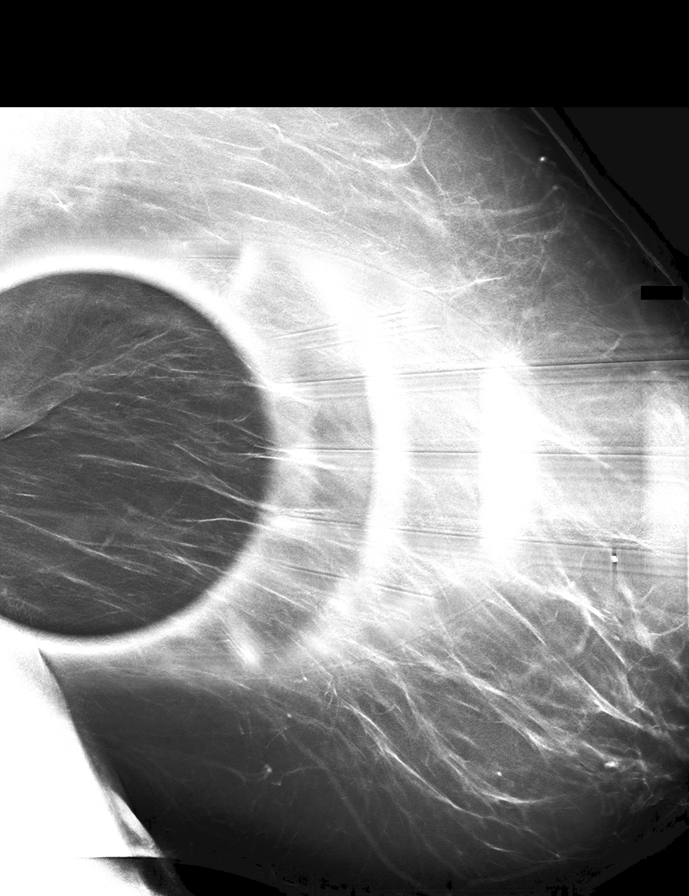

[R TAN synth-2D]
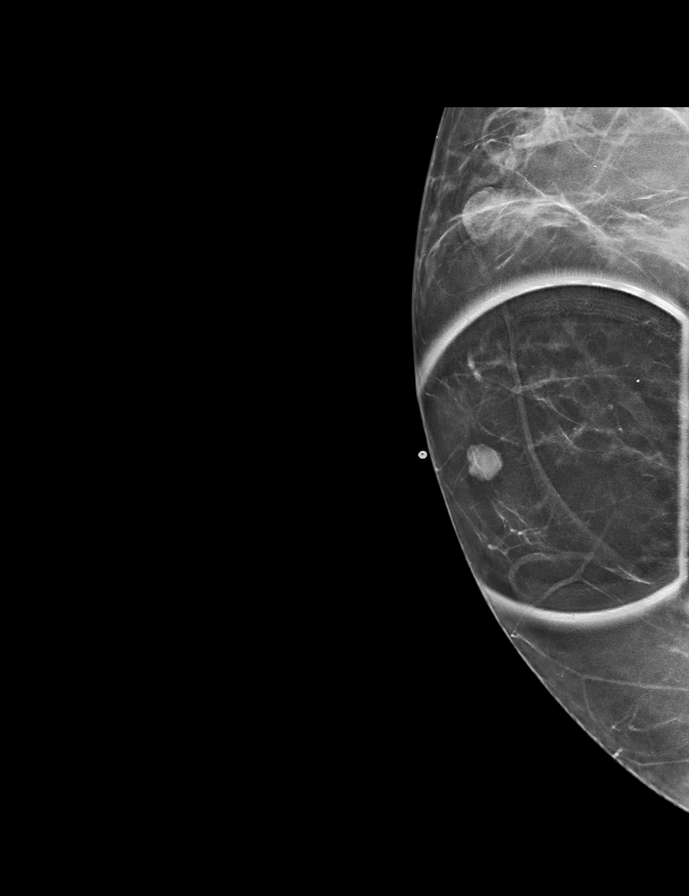

[R CV synth-2D]
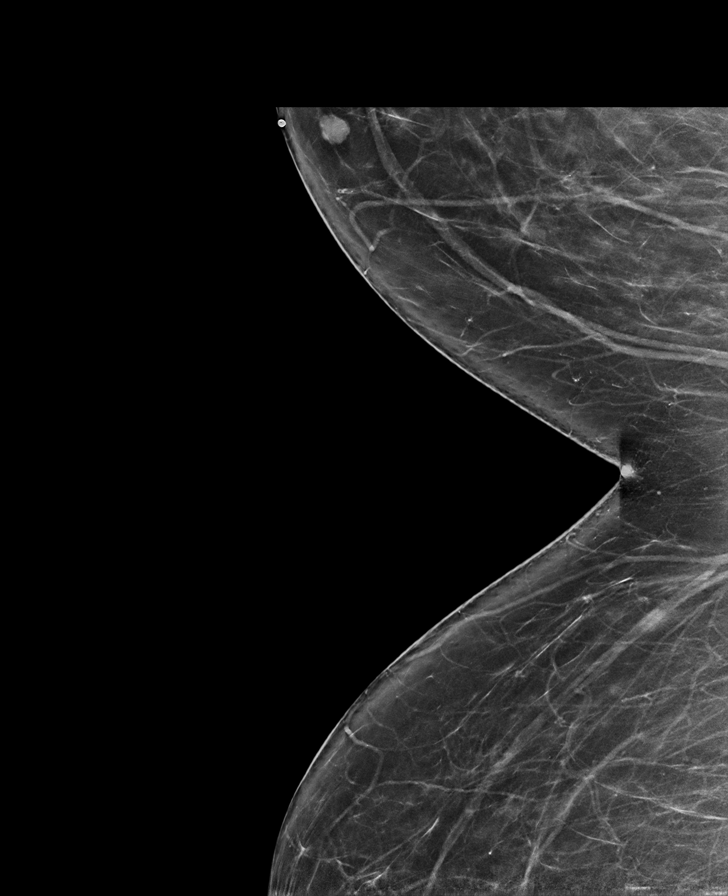

[R CV tomo · tomo slice 37/73.0]
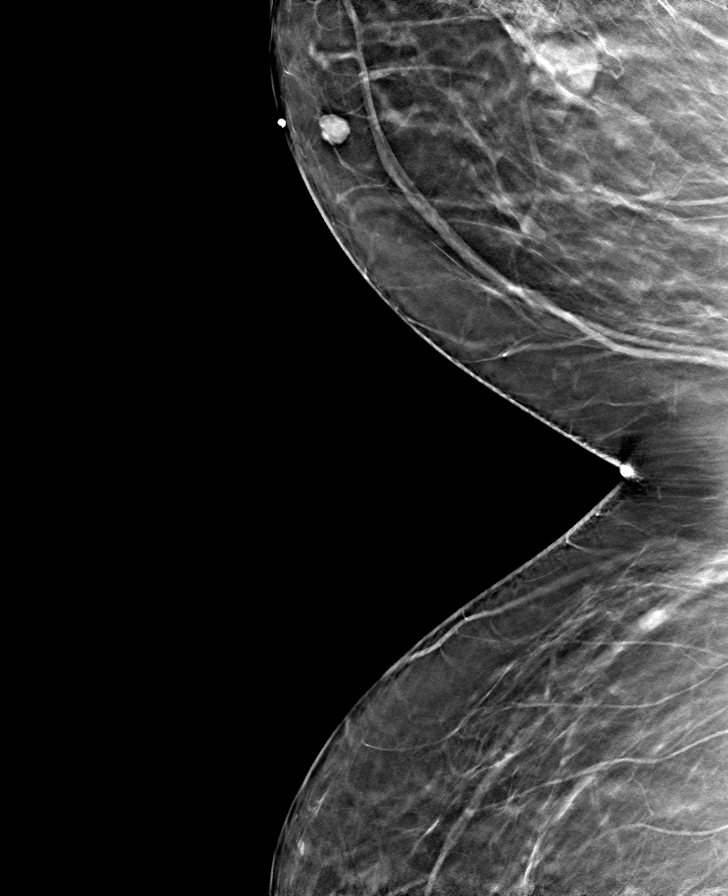

[8 of 40 positions shown; findings below may reference images not displayed]

ACR Breast Density Category b: There are scattered areas of
fibroglandular density.
FINDINGS: There are 2 masses in the medial aspect of the right breast. The
more anterior mass corresponds with the palpable abnormality
measures 9 mm. They are more posterior mass measures 2.0 cm.

In the far posterior aspect of the upper-inner quadrant of the left
breast there is an 8 mm.

There are no malignant type microcalcifications in either breast.

Mammographic images were processed with CAD.

Targeted ultrasound is performed, showing an irregular hypoechoic
mass in the right breast at 1 o'clock 6 cm from the nipple measuring
0.9 x 0.7 x 0.8 cm. There is a second well-circumscribed hypoechoic
mass in the right breast at 2 o'clock 4 cm from the nipple measuring
1.5 x 0.8 x 1.6 cm. Sonographic evaluation of the right axilla does
not show any enlarged adenopathy.
IMPRESSION: Two suspicious masses in the 1 and 2 o'clock region of the right
breast.

RECOMMENDATION:
Ultrasound-guided core biopsies of the masses in the 1 and 2 o'clock
region of the right breast is recommended.

Sonographic evaluation of the left breast was not performed
secondary to insurance authorization. Sonographic evaluation of the
left breast prior to the ultrasound-guided core biopsies of the
right breast is recommended.

I have discussed the findings and recommendations with the patient.
Results were also provided in writing at the conclusion of the
visit. If applicable, a reminder letter will be sent to the patient
regarding the next appointment.

BI-RADS CATEGORY  4: Suspicious.

## 2020-09-26 MED ORDER — LABETALOL HCL 100 MG PO TABS: 100.0000 mg | ORAL_TABLET | Freq: Two times a day (BID) | ORAL | 0 refills | Status: DC

## 2020-09-26 NOTE — Progress Notes (Signed)
Judy Baker Date of Birth: 1971/06/13 MRN: [id number] Primary Care Provider:Scheer, Fayrene Fearing, DO Former Cardiology Providers: Altamese Lumberton, APRN, FNP-C Primary Cardiologist: Tessa Lerner, DO, [facility] (established care 01/04/2020)  Date: 09/26/20 Last Office Visit: 03/13/2020  Chief Complaint  Patient presents with  . Hypertension  . Follow-up    HPI  Judy Baker is a 50 y.o.  female who presents to the office with a chief complaint of " 39-month follow-up for blood pressure management." Patient's past medical history and cardiovascular risk factors include: Hypertension, hyperlipidemia, obesity, obstructive sleep apnea on CPAP, active smoking, obesity due to excess calories.  Patient is being followed by our practice for the management of benign essential hypertension.  Prior work-up noted thyroid levels to be within normal limits.  Cortisol level within normal.  Renal duplex did not show renal artery stenosis.  Patient does have obstructive sleep apnea and does use her CPAP machine.  In the past patient's renin Aldo ratio was not accurately checked and recently her normetanephrine levels in the plasma were mildly elevated compared to the upper limit of normal.  Patient was informed that this could be secondary to her taking Cymbalta.    Patient is ambulatory blood pressure monitoring reviewed during today's office visit.  Systolic blood pressure ranges between 121-176 mmHg and diastolic blood pressures range between 70-114 mmHg with an average blood pressure reading of 147/92 with a pulse of 86 bpm.  Patient has not been checking her blood pressures consistently as well.  And at the last office visit was recommended to start Toprol-XL given her underlying PVCs; however, patient failed to have the prescription filled.  Patient continues to smoke at least half a pack per day but is willing to quit but has been unsuccessful since last office visit.  FUNCTIONAL STATUS: Patient states that she  has been walking more and enjoys doing her yard work.  ALLERGIES: Allergies  Allergen Reactions  . Penicillins Hives and Swelling    Did it involve swelling of the face/tongue/throat, SOB, or low BP? Yes Did it involve sudden or severe rash/hives, skin peeling, or any reaction on the inside of your mouth or nose? Yes Did you need to seek medical attention at a hospital or doctor's office? Yes When did it last happen? childhood (Teenage) allergy  If all above answers are "NO", may proceed with cephalosporin use.   . Sulfa Antibiotics Swelling     MEDICATION LIST PRIOR TO VISIT: Current Outpatient Medications on File Prior to Visit  Medication Sig Dispense Refill  . albuterol (PROVENTIL) (2.5 MG/3ML) 0.083% nebulizer solution Inhale 2.5 mg into the lungs 3 (three) times daily as needed for wheezing or shortness of breath.     Marland Kitchen albuterol (VENTOLIN HFA) 108 (90 Base) MCG/ACT inhaler Inhale 2 puffs into the lungs every 6 (six) hours as needed for wheezing or shortness of breath.    Marland Kitchen amLODipine (NORVASC) 10 MG tablet Take 1 tablet (10 mg total) by mouth daily. (Patient taking differently: Take 5 mg by mouth in the morning and at bedtime. Taking in the morning) 30 tablet 2  . azelastine (ASTELIN) 0.1 % nasal spray Place 2 sprays into both nostrils 2 (two) times daily as needed (allergies).     . budesonide-formoterol (SYMBICORT) 160-4.5 MCG/ACT inhaler Inhale 2 puffs into the lungs in the morning and at bedtime.    . busPIRone (BUSPAR) 10 MG tablet Take 10 mg by mouth 2 (two) times daily.    . Cholecalciferol (VITAMIN D-3) 125 MCG (  5000 UT) TABS Take 5,000 Units by mouth daily.    . diclofenac sodium (VOLTAREN) 1 % GEL Apply 2-4 g topically 4 (four) times daily as needed (pain.).    Marland Kitchen DULoxetine (CYMBALTA) 60 MG capsule Take 60 mg by mouth daily.    . fluticasone (FLONASE) 50 MCG/ACT nasal spray Place 1 spray into both nostrils 2 (two) times daily as needed for allergies.     . furosemide  (LASIX) 20 MG tablet Take 20 mg by mouth as needed.    Marland Kitchen HYDROcodone-acetaminophen (NORCO) 10-325 MG tablet Take 1 tablet by mouth 3 (three) times daily as needed (for pain). For rotary cuff pain    . losartan (COZAAR) 100 MG tablet TAKE 1 TABLET BY MOUTH EVERY DAY 90 tablet 0  . montelukast (SINGULAIR) 10 MG tablet Take 10 mg by mouth at bedtime.    . Multiple Vitamins-Minerals (CENTRUM SILVER 50+WOMEN) TABS Take 1 tablet by mouth daily.    . nitroGLYCERIN (NITROSTAT) 0.4 MG SL tablet Place 1 tablet (0.4 mg total) under the tongue every 5 (five) minutes as needed for chest pain. 30 tablet 1  . Omega-3 Fatty Acids (FISH OIL) 1000 MG CAPS Take 1,000 mg by mouth 2 (two) times daily.     Marland Kitchen omeprazole (PRILOSEC) 40 MG capsule Take 40 mg by mouth daily.    . pravastatin (PRAVACHOL) 40 MG tablet Take 40 mg by mouth daily.    . promethazine (PHENERGAN) 25 MG tablet Take 25 mg by mouth every 6 (six) hours as needed for nausea or vomiting.     Marland Kitchen spironolactone-hydrochlorothiazide (ALDACTAZIDE) 25-25 MG tablet Take 1 tablet by mouth daily. Restarted on 01/31/20    . tiZANidine (ZANAFLEX) 4 MG tablet Take 4 mg by mouth 3 (three) times daily as needed for muscle spasms.     . traMADol (ULTRAM) 50 MG tablet Take 50 mg by mouth every 12 (twelve) hours as needed (pain).     . traZODone (DESYREL) 100 MG tablet Take 100 mg by mouth at bedtime.    . vitamin C (ASCORBIC ACID) 500 MG tablet Take 500 mg by mouth as needed.     . zolpidem (AMBIEN) 10 MG tablet Take by mouth.     No current facility-administered medications on file prior to visit.    PAST MEDICAL HISTORY: Past Medical History:  Diagnosis Date  . Arthritis   . Asthma   . Carpal tunnel syndrome   . GERD (gastroesophageal reflux disease) 02/04/2019  . HLD (hyperlipidemia) 02/04/2019  . HTN (hypertension) 02/04/2019  . Hypertension   . Osteoarthritis     PAST SURGICAL HISTORY: Past Surgical History:  Procedure Laterality Date  . BREAST BIOPSY Right  12/21/2018   FIBROADENOMA/PASH  . BREAST SURGERY    . CARPAL TUNNEL RELEASE    . CHOLECYSTECTOMY    . LEFT HEART CATH AND CORONARY ANGIOGRAPHY N/A 03/30/2019   Procedure: LEFT HEART CATH AND CORONARY ANGIOGRAPHY;  Surgeon: Yates Decamp, MD;  Location: [facility] INVASIVE CV [facility];  Service: Cardiovascular;  Laterality: N/A;  . TUBAL LIGATION      FAMILY HISTORY: The patient's family history includes Aneurysm in her mother; Cardiomyopathy in her brother; Heart murmur in her sister; Stroke in her father; Valvular heart disease in her mother.   SOCIAL HISTORY:  The patient  reports that she has been smoking cigarettes. She has a 5.00 pack-year smoking history. She has never used smokeless tobacco. She reports current alcohol use. She reports that she does not use drugs.  Review of Systems  Constitutional: Negative for chills and fever.  HENT: Negative for hoarse voice and nosebleeds.   Eyes: Negative for discharge, double vision and pain.  Cardiovascular: Positive for dyspnea on exertion. Negative for chest pain, claudication, leg swelling, near-syncope, orthopnea, palpitations, paroxysmal nocturnal dyspnea and syncope.  Respiratory: Negative for hemoptysis and shortness of breath.   Musculoskeletal: Negative for muscle cramps and myalgias.  Gastrointestinal: Negative for abdominal pain, constipation, diarrhea, hematemesis, hematochezia, melena, nausea and vomiting.  Neurological: Negative for dizziness and light-headedness.    PHYSICAL EXAM: Vitals with BMI 09/26/2020 03/13/2020 02/01/2020  Height 5\' 4"  5\' 4"  5\' 6"   Weight 329 lbs 10 oz 332 lbs 334 lbs  BMI 56.55 56.96 53.93  Systolic 138 128  Diastolic 84 90 95  Pulse 76 62 76    CONSTITUTIONAL: Appears older than stated age, obese, hemodynamically stable, no acute distress.   SKIN: Skin is warm and dry. No rash noted. No cyanosis. No pallor. No jaundice HEAD: Normocephalic and atraumatic.  EYES: No scleral icterus MOUTH/THROAT: Moist oral  membranes.  NECK: No JVD present. No thyromegaly noted. No carotid bruits  LYMPHATIC: No visible cervical adenopathy.  CHEST Normal respiratory effort. No intercostal retractions  LUNGS: Clear to auscultation bilaterally.  No stridor. No wheezes. No rales.  CARDIOVASCULAR: Regular rate and rhythm, positive S1-S2, no murmurs rubs or gallops appreciated ABDOMINAL: Obese, soft, nontender, nondistended, positive bowel sounds in all 4 quadrants.  No apparent ascites.  EXTREMITIES: No peripheral edema  HEMATOLOGIC: No significant bruising NEUROLOGIC: Oriented to person, place, and time. Nonfocal. Normal muscle tone.  PSYCHIATRIC: Normal mood and affect. Normal behavior. Cooperative  CARDIAC DATABASE: EKG: 09/26/2020: Normal sinus rhythm, 70 bpm, poor R wave progression, without underlying injury pattern.  Echocardiogram: 01/20/2019: Left ventricle cavity is normal in size. Mild concentric hypertrophy of the left ventricle. Normal LV systolic function with EF 57%. Normal global wall motion. Doppler evidence of grade I (impaired) diastolic dysfunction, normal LAP. Calculated EF 57%. Left atrial cavity is mildly dilated at 3.9 cm. Mild to moderate mitral regurgitation. Mild tricuspid regurgitation. Estimated pulmonary artery systolic pressure is 28 mmHg.  Stress Testing:  Lexiscan Myoview stress test 02/22/2019: Lexiscan stress test was performed. Stress EKG is non-diagnostic, as this is pharmacological stress test. Normal myocardial thickening with LVEF 50%. SPECT stress and rest images reveal small size, mild intensity, decreased tracer uptake in apical myocardium. In addition, SPECT rest images reveal decreased tracer uptake of medium intensity in basal anteroseptal, inferoseptal myocardium. These findings are most likely due to breast attenuation in a morbidly obese patient. Clinical correlation recommended.  Intermediate risk study.  Heart Catheterization: Cath 03/30/2019:  LV end  diastolic pressure is normal. The left ventricular systolic function is normal.  Overall normal coronary arteries  30 day event monitor 01/19-02/18/2021: Normal sinus rhythm. Average HR 79 bpm. Max HR 158 bpm, Min HR 50 bpm. Symptoms of flutter/skipped beats, shortness of breath, dizziness correlated with Sinus rhythm with rare PVC. 1 episode of flutter/skipped beats, shortness of breath correlated with Sinus tachycardia at 108 bpm. No A fib or SVT was noted  LABORATORY DATA: CBC Latest Ref Rng & Units 11/04/2019 05/13/2019 03/18/2019  WBC 3.4 - 10.8 x10E3/uL 6.4 7.8 5.4  Hemoglobin 11.1 - 15.9 g/dL 01/04/2020 13/06/2019 03/20/2019  Hematocrit 34.0 - 46.6 % 42.0 43.9 45.2  Platelets 150 - 450 x10E3/uL 294 298 262    CMP Latest Ref Rng & Units 02/11/2020 01/18/2020 11/04/2019  Glucose 65 - 99 mg/dL 93  88 81  BUN 6 - 24 mg/dL 14 14 10   Creatinine 0.57 - 1.00 mg/dL 1.610.80 0.960.94 0.450.78  Sodium 134 - 144 mmol/L 138 144 142  Potassium 3.5 - 5.2 mmol/L 3.6 4.6 4.3  Chloride 96 - 106 mmol/L 98 105 102  CO2 20 - 29 mmol/L 23 21 25   Calcium 8.7 - 10.2 mg/dL 40.910.0 9.9 9.6  Total Protein 6.0 - 8.5 g/dL - 6.9 6.9  Total Bilirubin 0.0 - 1.2 mg/dL - 0.4 0.3  Alkaline Phos 48 - 121 IU/L - 111 115  AST 0 - 40 IU/L - 18 18  ALT IU/L - 20 26    Lipid Panel     Component Value Date/Time   CHOL 193 11/04/2019 1546   TRIG 64 11/04/2019 1546   HDL 67 11/04/2019 1546   CHOLHDL 2.9 11/04/2019 1546   LDLCALC 114 (H) 11/04/2019 1546   LABVLDL 12 11/04/2019 1546    No results found for: HGBA1C No components found for: NTPROBNP Lab Results  Component Value Date   TSH 1.140 11/04/2019    Cardiac Panel (last 3 results) No results for input(s): CKTOTAL, CKMB, TROPONINIHS, RELINDX in the last 72 hours.  IMPRESSION:    ICD-10-CM   1. Resistant hypertension  I10 EKG 12-Lead    labetalol (NORMODYNE) 100 MG tablet  2. PVC (premature ventricular contraction)  I49.3   3. Tobacco use disorder  F17.200   4. Class 3 severe obesity  due to excess calories without serious comorbidity with body mass index (BMI) of 50.0 to 59.9 in adult (HCC)  E66.01    Z68.43   5. Pure hypercholesterolemia  E78.00   6. OSA on CPAP  G47.33    Z99.89      RECOMMENDATIONS: Judy Baker is a 50 y.o. female whose past medical history and cardiovascular risk factors include: Hypertension, hyperlipidemia, obesity, obstructive sleep apnea on CPAP, active smoking, obesity due to excess calories.  Resistant hypertension:  In the work-up of secondary hypertension patient has undergone a renal duplex, sleep study, a.m. cortisol in the past was noted to be within normal limits, TSH within normal limits.    Attempted to perform Aldo and renin ratio but have been unsuccessful as the patient need to start on spironolactone due to lower extremity swelling and labs are not drawn correctly.    Plasma metanephrines were slightly elevated this may be a false positive as the patient is on Cymbalta which may falsely positively elevate the levels.    Continue ambulatory blood pressure monitoring.   Low-salt diet recommended.  Start labetalol 100 mg p.o. twice daily.  In the past patient was recommended to establish care with endocrinology to continue the work-up of secondary hypertension.  However, patient states that she has not had a chance to establish care with endocrinology as of yet.  PVCs:  Recommended starting Toprol-XL at the last office visit; however, unfortunately she failed to fill the prescription.   States that she is not experiencing as much palpitations as before. Continue to monitor   Smoking:  Tobacco cessation counseling:  Currently smoking 0.5 packs/day    Patient was informed of the dangers of tobacco abuse including stroke, cancer, and MI, as well as benefits of tobacco cessation.  Patient is willing to quit at this time.  Approximately 7 mins were spent counseling patient cessation techniques. We discussed various methods  to help quit smoking, including deciding on a date to quit, joining a support group, pharmacological agents- nicotine gum/patch/lozenges, chantix.  I will reassess her progress at the next follow-up visit  Hyperlipidemia: . Continue statin therapy.   . Currently managed by primary care provider. . Patient denies myalgia or other side effects.  Obesity, due to excess calories: Body mass index is 56.58 kg/m. . I reviewed with the patient the importance of diet, regular physical activity/exercise, weight loss.   . Patient is educated on increasing physical activity gradually as tolerated.  With the goal of moderate intensity exercise for 30 minutes a day 5 days a week.  FINAL MEDICATION LIST END OF ENCOUNTER: Meds ordered this encounter  Medications  . labetalol (NORMODYNE) 100 MG tablet    Sig: Take 1 tablet (100 mg total) by mouth 2 (two) times daily. Hold if top blood pressure number less than 100 mmHg or pulse less than 60 bpm.    Dispense:  180 tablet    Refill:  0     Current Outpatient Medications:  .  albuterol (PROVENTIL) (2.5 MG/3ML) 0.083% nebulizer solution, Inhale 2.5 mg into the lungs 3 (three) times daily as needed for wheezing or shortness of breath. , Disp: , Rfl:  .  albuterol (VENTOLIN HFA) 108 (90 Base) MCG/ACT inhaler, Inhale 2 puffs into the lungs every 6 (six) hours as needed for wheezing or shortness of breath., Disp: , Rfl:  .  amLODipine (NORVASC) 10 MG tablet, Take 1 tablet (10 mg total) by mouth daily. (Patient taking differently: Take 5 mg by mouth in the morning and at bedtime. Taking in the morning), Disp: 30 tablet, Rfl: 2 .  azelastine (ASTELIN) 0.1 % nasal spray, Place 2 sprays into both nostrils 2 (two) times daily as needed (allergies). , Disp: , Rfl:  .  budesonide-formoterol (SYMBICORT) 160-4.5 MCG/ACT inhaler, Inhale 2 puffs into the lungs in the morning and at bedtime., Disp: , Rfl:  .  busPIRone (BUSPAR) 10 MG tablet, Take 10 mg by mouth 2 (two)  times daily., Disp: , Rfl:  .  Cholecalciferol (VITAMIN D-3) 125 MCG (5000 UT) TABS, Take 5,000 Units by mouth daily., Disp: , Rfl:  .  diclofenac sodium (VOLTAREN) 1 % GEL, Apply 2-4 g topically 4 (four) times daily as needed (pain.)., Disp: , Rfl:  .  DULoxetine (CYMBALTA) 60 MG capsule, Take 60 mg by mouth daily., Disp: , Rfl:  .  fluticasone (FLONASE) 50 MCG/ACT nasal spray, Place 1 spray into both nostrils 2 (two) times daily as needed for allergies. , Disp: , Rfl:  .  furosemide (LASIX) 20 MG tablet, Take 20 mg by mouth as needed., Disp: , Rfl:  .  HYDROcodone-acetaminophen (NORCO) 10-325 MG tablet, Take 1 tablet by mouth 3 (three) times daily as needed (for pain). For rotary cuff pain, Disp: , Rfl:  .  labetalol (NORMODYNE) 100 MG tablet, Take 1 tablet (100 mg total) by mouth 2 (two) times daily. Hold if top blood pressure number less than 100 mmHg or pulse less than 60 bpm., Disp: 180 tablet, Rfl: 0 .  losartan (COZAAR) 100 MG tablet, TAKE 1 TABLET BY MOUTH EVERY DAY, Disp: 90 tablet, Rfl: 0 .  montelukast (SINGULAIR) 10 MG tablet, Take 10 mg by mouth at bedtime., Disp: , Rfl:  .  Multiple Vitamins-Minerals (CENTRUM SILVER 50+WOMEN) TABS, Take 1 tablet by mouth daily., Disp: , Rfl:  .  nitroGLYCERIN (NITROSTAT) 0.4 MG SL tablet, Place 1 tablet (0.4 mg total) under the tongue every 5 (five) minutes as needed for chest pain., Disp: 30 tablet, Rfl: 1 .  Omega-3 Fatty Acids (  FISH OIL) 1000 MG CAPS, Take 1,000 mg by mouth 2 (two) times daily. , Disp: , Rfl:  .  omeprazole (PRILOSEC) 40 MG capsule, Take 40 mg by mouth daily., Disp: , Rfl:  .  pravastatin (PRAVACHOL) 40 MG tablet, Take 40 mg by mouth daily., Disp: , Rfl:  .  promethazine (PHENERGAN) 25 MG tablet, Take 25 mg by mouth every 6 (six) hours as needed for nausea or vomiting. , Disp: , Rfl:  .  spironolactone-hydrochlorothiazide (ALDACTAZIDE) 25-25 MG tablet, Take 1 tablet by mouth daily. Restarted on 01/31/20, Disp: , Rfl:  .  tiZANidine  (ZANAFLEX) 4 MG tablet, Take 4 mg by mouth 3 (three) times daily as needed for muscle spasms. , Disp: , Rfl:  .  traMADol (ULTRAM) 50 MG tablet, Take 50 mg by mouth every 12 (twelve) hours as needed (pain). , Disp: , Rfl:  .  traZODone (DESYREL) 100 MG tablet, Take 100 mg by mouth at bedtime., Disp: , Rfl:  .  vitamin C (ASCORBIC ACID) 500 MG tablet, Take 500 mg by mouth as needed. , Disp: , Rfl:  .  zolpidem (AMBIEN) 10 MG tablet, Take by mouth., Disp: , Rfl:   Orders Placed This Encounter  Procedures  . EKG 12-Lead   --Continue cardiac medications as reconciled in final medication list. --Return in about 3 months (around 12/27/2020) for Follow up, BP. Or sooner if needed. --Continue follow-up with your primary care physician regarding the management of your other chronic comorbid conditions.  Patient's questions and concerns were addressed to her satisfaction. She voices understanding of the instructions provided during this encounter.   This note was created using a voice recognition software as a result there may be grammatical errors inadvertently enclosed that do not reflect the nature of this encounter. Every attempt is made to correct such errors.  Tessa Lerner, [place], [facility]  Pager: [phone] Office: [phone]

## 2020-10-06 ENCOUNTER — Other Ambulatory Visit: Payer: Self-pay

## 2020-11-01 ENCOUNTER — Encounter: Payer: Self-pay | Admitting: Cardiology

## 2020-11-11 ENCOUNTER — Ambulatory Visit (INDEPENDENT_AMBULATORY_CARE_PROVIDER_SITE_OTHER): Payer: Medicaid Other

## 2020-11-11 ENCOUNTER — Ambulatory Visit
Admission: EM | Admit: 2020-11-11 | Discharge: 2020-11-11 | Disposition: A | Discharge status: Home / Self Care | Payer: Medicaid Other | Attending: Emergency Medicine | Admitting: Emergency Medicine

## 2020-11-11 ENCOUNTER — Other Ambulatory Visit: Payer: Self-pay

## 2020-11-11 ENCOUNTER — Encounter: Payer: Self-pay | Admitting: Emergency Medicine

## 2020-11-11 DIAGNOSIS — S63501A Unspecified sprain of right wrist, initial encounter: Secondary | ICD-10-CM | POA: Diagnosis not present

## 2020-11-11 DIAGNOSIS — W19XXXA Unspecified fall, initial encounter: Secondary | ICD-10-CM

## 2020-11-11 DIAGNOSIS — M25531 Pain in right wrist: Secondary | ICD-10-CM | POA: Diagnosis not present

## 2020-11-11 MED ORDER — NAPROXEN 500 MG PO TABS: 500.0000 mg | ORAL_TABLET | Freq: Two times a day (BID) | ORAL | 0 refills | Status: DC

## 2020-11-11 NOTE — Discharge Instructions (Signed)
No fractures on x-ray Naprosyn twice daily with food Wear wrist brace over the next 1 to 2 weeks as wrist healing Ice and elevate Follow-up if not improving or worsening

## 2020-11-11 NOTE — ED Triage Notes (Signed)
Pt fell this am and fell onto there wrist to catch her fall. Right wrist is tender to bend.

## 2020-11-12 NOTE — ED Provider Notes (Signed)
[facility]    CSN: [id number] Arrival date & time: 11/11/20  1335      History   Chief Complaint Chief Complaint  Patient presents with  . Wrist Pain    HPI Judy Baker is a 50 y.o. female presenting today for evaluation of wrist injury.  Patient reports she fell earlier this morning and attempted to catch her fall.  Since she has had right wrist pain.  Has developed some bruising as well.  Denies history of prior fracture.  Denies numbness or tingling.  Pain does radiate upward, but denies any elbow or shoulder pain.  HPI  Past Medical History:  Diagnosis Date  . Arthritis   . Asthma   . Carpal tunnel syndrome   . GERD (gastroesophageal reflux disease) 02/04/2019  . HLD (hyperlipidemia) 02/04/2019  . HTN (hypertension) 02/04/2019  . Hypertension   . Osteoarthritis     Patient Active Problem List   Diagnosis Date Noted  . Angina pectoris (HCC) 03/30/2019  . GERD (gastroesophageal reflux disease) 02/04/2019  . HLD (hyperlipidemia) 02/04/2019  . HTN (hypertension) 02/04/2019    Past Surgical History:  Procedure Laterality Date  . BREAST BIOPSY Right 12/21/2018   FIBROADENOMA/PASH  . BREAST SURGERY    . CARPAL TUNNEL RELEASE    . CHOLECYSTECTOMY    . LEFT HEART CATH AND CORONARY ANGIOGRAPHY N/A 03/30/2019   Procedure: LEFT HEART CATH AND CORONARY ANGIOGRAPHY;  Surgeon: Yates Decamp, MD;  Location: [facility];  Service: Cardiovascular;  Laterality: N/A;  . TUBAL LIGATION      OB History   No obstetric history on file.      Home Medications    Prior to Admission medications   Medication Sig Start Date End Date Taking? Authorizing Provider  naproxen (NAPROSYN) 500 MG tablet Take 1 tablet (500 mg total) by mouth 2 (two) times daily. 11/11/20  Yes Audrina Marten C, PA-C  albuterol (PROVENTIL) (2.5 MG/3ML) 0.083% nebulizer solution Inhale 2.5 mg into the lungs 3 (three) times daily as needed for wheezing or shortness of breath.  02/18/19    [provider]  albuterol (VENTOLIN HFA) 108 (90 Base) MCG/ACT inhaler Inhale 2 puffs into the lungs every 6 (six) hours as needed for wheezing or shortness of breath.    [provider]  amLODipine (NORVASC) 10 MG tablet Take 1 tablet (10 mg total) by mouth daily. Patient taking differently: Take 5 mg by mouth in the morning and at bedtime. Taking in the morning 07/20/19 03/13/20  Toniann Fail, NP  azelastine (ASTELIN) 0.1 % nasal spray Place 2 sprays into both nostrils 2 (two) times daily as needed (allergies).  12/21/18   [provider]  budesonide-formoterol (SYMBICORT) 160-4.5 MCG/ACT inhaler Inhale 2 puffs into the lungs in the morning and at bedtime.    [provider]  busPIRone (BUSPAR) 10 MG tablet Take 10 mg by mouth 2 (two) times daily.    [provider]  Cholecalciferol (VITAMIN D-3) 125 MCG (5000 UT) TABS Take 5,000 Units by mouth daily.    [provider]  diclofenac sodium (VOLTAREN) 1 % GEL Apply 2-4 g topically 4 (four) times daily as needed (pain.).    [provider]  DULoxetine (CYMBALTA) 60 MG capsule Take 60 mg by mouth daily. 11/24/19   [provider]  fluticasone (FLONASE) 50 MCG/ACT nasal spray Place 1 spray into both nostrils 2 (two) times daily as needed for allergies.  12/21/18   [provider]  furosemide (LASIX) 20 MG tablet Take 20 mg by mouth as needed.    [provider]  HYDROcodone-acetaminophen (NORCO) 10-325 MG tablet Take 1 tablet by mouth 3 (three) times daily as needed (for pain). For rotary cuff pain 01/04/19   [provider]  labetalol (NORMODYNE) 100 MG tablet Take 1 tablet (100 mg total) by mouth 2 (two) times daily. Hold if top blood pressure number less than 100 mmHg or pulse less than 60 bpm. 09/26/20 12/25/20  Tolia, Sunit, DO  losartan (COZAAR) 100 MG tablet TAKE 1 TABLET BY MOUTH EVERY DAY 02/28/20   Tolia, Sunit, DO  montelukast (SINGULAIR) 10 MG  tablet Take 10 mg by mouth at bedtime.    [provider]  Multiple Vitamins-Minerals (CENTRUM SILVER 50+WOMEN) TABS Take 1 tablet by mouth daily.    [provider]  nitroGLYCERIN (NITROSTAT) 0.4 MG SL tablet Place 1 tablet (0.4 mg total) under the tongue every 5 (five) minutes as needed for chest pain. 10/29/19 02/01/20  Yates Decamp, MD  Omega-3 Fatty Acids (FISH OIL) 1000 MG CAPS Take 1,000 mg by mouth 2 (two) times daily.     [provider]  omeprazole (PRILOSEC) 40 MG capsule Take 40 mg by mouth daily. 12/29/19   [provider]  pravastatin (PRAVACHOL) 40 MG tablet Take 40 mg by mouth daily.    [provider]  promethazine (PHENERGAN) 25 MG tablet Take 25 mg by mouth every 6 (six) hours as needed for nausea or vomiting.  12/21/18   [provider]  spironolactone-hydrochlorothiazide (ALDACTAZIDE) 25-25 MG tablet Take 1 tablet by mouth daily. Restarted on 01/31/20 01/24/20   [provider]  tiZANidine (ZANAFLEX) 4 MG tablet Take 4 mg by mouth 3 (three) times daily as needed for muscle spasms.  12/21/18   [provider]  traMADol (ULTRAM) 50 MG tablet Take 50 mg by mouth every 12 (twelve) hours as needed (pain).  12/29/18   [provider]  traZODone (DESYREL) 100 MG tablet Take 100 mg by mouth at bedtime. 12/21/18   [provider]  vitamin C (ASCORBIC ACID) 500 MG tablet Take 500 mg by mouth as needed.     [provider]  zolpidem (AMBIEN) 10 MG tablet Take by mouth. 09/19/20   [provider]    Family History Family History  Problem Relation Age of Onset  . Aneurysm Mother   . Valvular heart disease Mother   . Stroke Father   . Heart murmur Sister   . Cardiomyopathy Brother     Social History Social History   Tobacco Use  . Smoking status: Current Every Day Smoker    Packs/day: 0.25    Years: 20.00    Pack years: 5.00    Types: Cigarettes  . Smokeless tobacco: Never Used  .  Tobacco comment: 3 cigs a day  Vaping Use  . Vaping Use: Never used  Substance Use Topics  . Alcohol use: Yes    Comment: occas  . Drug use: Never     Allergies   Penicillins and Sulfa antibiotics   Review of Systems Review of Systems  Constitutional: Negative for fatigue and fever.  Eyes: Negative for visual disturbance.  Respiratory: Negative for shortness of breath.   Cardiovascular: Negative for chest pain.  Gastrointestinal: Negative for abdominal pain, nausea and vomiting.  Musculoskeletal: Positive for arthralgias. Negative for joint swelling.  Skin: Negative for color change, rash and wound.  Neurological: Negative for dizziness, weakness, light-headedness and headaches.  Physical Exam Triage Vital Signs ED Triage Vitals [11/11/20 1512]  Enc Vitals Group     BP (!) 168/70     Pulse Rate 72     Resp 16     Temp 98.7 F (37.1 C)     Temp Source Oral     SpO2 96 %     Weight      Height      Head Circumference      Peak Flow      Pain Score 7     Pain Loc      Pain Edu?      Excl. in GC?    No data found.  Updated Vital Signs BP (!) 168/70 (BP Location: Right Arm)   Pulse 72   Temp 98.7 F (37.1 C) (Oral)   Resp 16   SpO2 96%   Visual Acuity Right Eye Distance:   Left Eye Distance:   Bilateral Distance:    Right Eye Near:   Left Eye Near:    Bilateral Near:     Physical Exam Vitals and nursing note reviewed.  Constitutional:      Appearance: She is well-developed.     Comments: No acute distress  HENT:     Head: Normocephalic and atraumatic.     Nose: Nose normal.  Eyes:     Conjunctiva/sclera: Conjunctivae normal.  Cardiovascular:     Rate and Rhythm: Normal rate.  Pulmonary:     Effort: Pulmonary effort is normal. No respiratory distress.  Abdominal:     General: There is no distension.  Musculoskeletal:        General: Normal range of motion.     Cervical back: Neck supple.     Comments: Right wrist with some mild  bruising and swelling noted to ulnar aspect with associated tenderness, radial pulse 2+, nontender to palpation around elbow and throughout metacarpals, no snuffbox tenderness  Full active range of motion of all 5 fingers  Skin:    General: Skin is warm and dry.  Neurological:     Mental Status: She is alert and oriented to person, place, and time.      UC Treatments / Results  Labs (all labs ordered are listed, but only abnormal results are displayed) Labs Reviewed - No data to display  EKG   Radiology DG Wrist Complete Right  Result Date: 11/11/2020 CLINICAL DATA:  Right wrist pain after falling this morning. Constant pain with swelling. Limited ROM. Pt unable to make a fist or extend hand backwards. Pain more on the ulna styloid/medial aspect of wrist. EXAM: RIGHT WRIST - COMPLETE 3+ VIEW COMPARISON:  None. FINDINGS: There is no evidence of fracture or dislocation. There is no evidence of arthropathy or other focal bone abnormality. Soft tissues are unremarkable. IMPRESSION: Negative. Electronically Signed   By: Emmaline Kluver M.D.   On: 11/11/2020 15:01    Procedures Procedures (including critical care time)  Medications Ordered in UC Medications - No data to display  Initial Impression / Assessment and Plan / UC Course  I have reviewed the triage vital signs and the nursing notes.  Pertinent labs & imaging results that were available during my care of the patient were reviewed by me and considered in my medical decision making (see chart for details).     X-ray negative for acute fracture, treating as sprain with wrist brace NSAIDs ice and elevation and monitoring progression resolution.  Discussed strict return precautions. Patient verbalized understanding and is  agreeable with plan.  Final Clinical Impressions(s) / UC Diagnoses   Final diagnoses:  Wrist sprain, right, initial encounter     Discharge Instructions     No fractures on x-ray Naprosyn twice  daily with food Wear wrist brace over the next 1 to 2 weeks as wrist healing Ice and elevate Follow-up if not improving or worsening    ED Prescriptions    Medication Sig Dispense Auth. Provider   naproxen (NAPROSYN) 500 MG tablet Take 1 tablet (500 mg total) by mouth 2 (two) times daily. 30 tablet [place] Schorr, LolitaHallie C, PA-C     PDMP not reviewed this encounter.   Lew DawesWieters, Sabino Denning C, [place]PA-C 11/12/20 [phone]

## 2020-12-11 ENCOUNTER — Other Ambulatory Visit: Payer: Self-pay | Admitting: Cardiology

## 2020-12-11 DIAGNOSIS — I209 Angina pectoris, unspecified: Secondary | ICD-10-CM

## 2020-12-22 ENCOUNTER — Ambulatory Visit: Payer: Medicaid Other | Admitting: Cardiology

## 2020-12-23 ENCOUNTER — Other Ambulatory Visit: Payer: Self-pay | Admitting: Cardiology

## 2020-12-23 DIAGNOSIS — I10 Essential (primary) hypertension: Secondary | ICD-10-CM

## 2020-12-25 ENCOUNTER — Ambulatory Visit: Payer: Medicaid Other | Admitting: Cardiology

## 2021-01-12 ENCOUNTER — Ambulatory Visit: Payer: Medicaid Other | Admitting: Cardiology

## 2021-01-31 ENCOUNTER — Ambulatory Visit: Payer: Medicaid Other | Admitting: Cardiology

## 2021-02-05 DIAGNOSIS — F429 Obsessive-compulsive disorder, unspecified: Secondary | ICD-10-CM | POA: Insufficient documentation

## 2021-02-05 DIAGNOSIS — M47816 Spondylosis without myelopathy or radiculopathy, lumbar region: Secondary | ICD-10-CM | POA: Insufficient documentation

## 2021-02-05 DIAGNOSIS — K259 Gastric ulcer, unspecified as acute or chronic, without hemorrhage or perforation: Secondary | ICD-10-CM | POA: Insufficient documentation

## 2021-02-05 DIAGNOSIS — F411 Generalized anxiety disorder: Secondary | ICD-10-CM | POA: Insufficient documentation

## 2021-02-05 DIAGNOSIS — F32A Depression, unspecified: Secondary | ICD-10-CM | POA: Insufficient documentation

## 2021-02-05 DIAGNOSIS — G4700 Insomnia, unspecified: Secondary | ICD-10-CM | POA: Insufficient documentation

## 2021-02-05 DIAGNOSIS — E559 Vitamin D deficiency, unspecified: Secondary | ICD-10-CM | POA: Insufficient documentation

## 2021-02-05 DIAGNOSIS — F17200 Nicotine dependence, unspecified, uncomplicated: Secondary | ICD-10-CM | POA: Insufficient documentation

## 2021-02-05 DIAGNOSIS — M1710 Unilateral primary osteoarthritis, unspecified knee: Secondary | ICD-10-CM | POA: Insufficient documentation

## 2021-02-05 DIAGNOSIS — Z6841 Body Mass Index (BMI) 40.0 and over, adult: Secondary | ICD-10-CM | POA: Insufficient documentation

## 2021-02-05 DIAGNOSIS — F319 Bipolar disorder, unspecified: Secondary | ICD-10-CM | POA: Insufficient documentation

## 2021-02-12 DIAGNOSIS — M545 Low back pain, unspecified: Secondary | ICD-10-CM | POA: Insufficient documentation

## 2021-02-12 DIAGNOSIS — M7661 Achilles tendinitis, right leg: Secondary | ICD-10-CM | POA: Insufficient documentation

## 2021-02-19 ENCOUNTER — Ambulatory Visit: Payer: Medicaid Other | Admitting: Cardiology

## 2021-02-20 ENCOUNTER — Other Ambulatory Visit: Payer: Self-pay

## 2021-02-20 ENCOUNTER — Inpatient Hospital Stay (HOSPITAL_COMMUNITY): Payer: Medicaid Other

## 2021-02-20 ENCOUNTER — Ambulatory Visit: Payer: Medicaid Other | Admitting: Cardiology

## 2021-02-20 ENCOUNTER — Encounter: Payer: Self-pay | Admitting: Cardiology

## 2021-02-20 ENCOUNTER — Observation Stay (HOSPITAL_COMMUNITY)
Admission: AD | Admit: 2021-02-20 | Discharge: 2021-02-21 | Disposition: A | Discharge status: Home / Self Care | Payer: Medicaid Other | Source: Ambulatory Visit | Attending: Cardiology | Admitting: Cardiology

## 2021-02-20 ENCOUNTER — Encounter (HOSPITAL_COMMUNITY): Payer: Self-pay | Admitting: Cardiology

## 2021-02-20 VITALS — BP 147/88 | HR 67 | Temp 98.2°F | Ht 64.0 in | Wt 335.0 lb

## 2021-02-20 DIAGNOSIS — J45909 Unspecified asthma, uncomplicated: Secondary | ICD-10-CM | POA: Insufficient documentation

## 2021-02-20 DIAGNOSIS — F17210 Nicotine dependence, cigarettes, uncomplicated: Secondary | ICD-10-CM | POA: Insufficient documentation

## 2021-02-20 DIAGNOSIS — Z20822 Contact with and (suspected) exposure to covid-19: Secondary | ICD-10-CM | POA: Diagnosis not present

## 2021-02-20 DIAGNOSIS — I10 Essential (primary) hypertension: Secondary | ICD-10-CM | POA: Diagnosis not present

## 2021-02-20 DIAGNOSIS — G4733 Obstructive sleep apnea (adult) (pediatric): Secondary | ICD-10-CM

## 2021-02-20 DIAGNOSIS — Z7982 Long term (current) use of aspirin: Secondary | ICD-10-CM | POA: Diagnosis not present

## 2021-02-20 DIAGNOSIS — R0789 Other chest pain: Secondary | ICD-10-CM | POA: Diagnosis present

## 2021-02-20 DIAGNOSIS — I493 Ventricular premature depolarization: Secondary | ICD-10-CM | POA: Insufficient documentation

## 2021-02-20 DIAGNOSIS — I200 Unstable angina: Secondary | ICD-10-CM

## 2021-02-20 DIAGNOSIS — F17200 Nicotine dependence, unspecified, uncomplicated: Secondary | ICD-10-CM

## 2021-02-20 DIAGNOSIS — Z79899 Other long term (current) drug therapy: Secondary | ICD-10-CM | POA: Diagnosis not present

## 2021-02-20 DIAGNOSIS — R0602 Shortness of breath: Secondary | ICD-10-CM | POA: Diagnosis not present

## 2021-02-20 DIAGNOSIS — Z9989 Dependence on other enabling machines and devices: Secondary | ICD-10-CM

## 2021-02-20 DIAGNOSIS — E7800 Pure hypercholesterolemia, unspecified: Secondary | ICD-10-CM

## 2021-02-20 LAB — ECHOCARDIOGRAM COMPLETE
Area-P 1/2: 2.76 cm2
Height: 64 in
S' Lateral: 3.6 cm
Single Plane A2C EF: 54.2 %
Weight: 5360 oz

## 2021-02-20 LAB — TROPONIN I (HIGH SENSITIVITY)
Troponin I (High Sensitivity): 3 ng/L (ref <18)
Troponin I (High Sensitivity): 3 ng/L (ref <18)

## 2021-02-20 LAB — BASIC METABOLIC PANEL
Anion gap: 9 (ref 5–15)
BUN: 13 mg/dL (ref 6–20)
CO2: 27 mmol/L (ref 22–32)
Calcium: 9.6 mg/dL (ref 8.9–10.3)
Chloride: 101 mmol/L (ref 98–111)
Creatinine, Ser: 0.92 mg/dL (ref 0.44–1.00)
GFR, Estimated: >60 mL/min (ref >60)
Glucose, Bld: 84 mg/dL (ref 70–99)
Potassium: 3.3 mmol/L — ABNORMAL LOW (ref 3.5–5.1)
Sodium: 137 mmol/L (ref 135–145)

## 2021-02-20 LAB — CBC
HCT: 45.5 % (ref 36.0–46.0)
Hemoglobin: 15.3 g/dL — ABNORMAL HIGH (ref 12.0–15.0)
MCH: 31.2 pg (ref 26.0–34.0)
MCHC: 33.6 g/dL (ref 30.0–36.0)
MCV: 92.7 fL (ref 80.0–100.0)
Platelets: 259 10*3/uL (ref 150–400)
RBC: 4.91 MIL/uL (ref 3.87–5.11)
RDW: 13.5 % (ref 11.5–15.5)
WBC: 6 10*3/uL (ref 4.0–10.5)
nRBC: 0 % (ref 0.0–0.2)

## 2021-02-20 LAB — SARS CORONAVIRUS 2 BY RT PCR (HOSPITAL ORDER, PERFORMED IN ~~LOC~~ HOSPITAL LAB): SARS Coronavirus 2: NEGATIVE

## 2021-02-20 LAB — MAGNESIUM: Magnesium: 2 mg/dL (ref 1.7–2.4)

## 2021-02-20 LAB — HCG, SERUM, QUALITATIVE: Preg, Serum: NEGATIVE

## 2021-02-20 LAB — BRAIN NATRIURETIC PEPTIDE: B Natriuretic Peptide: 9.7 pg/mL (ref 0.0–100.0)

## 2021-02-20 MED ORDER — SODIUM CHLORIDE 0.9% FLUSH: 3.0000 mL | INTRAVENOUS | Status: DC | PRN

## 2021-02-20 MED ORDER — NITROGLYCERIN IN D5W 200-5 MCG/ML-% IV SOLN
0.0000 ug/min | INTRAVENOUS | Status: DC
  Administered 2021-02-20: 5 ug/min via INTRAVENOUS
  Filled 2021-02-20: qty 250

## 2021-02-20 MED ORDER — PRAVASTATIN SODIUM 40 MG PO TABS
40.0000 mg | ORAL_TABLET | Freq: Every day | ORAL | Status: DC
  Administered 2021-02-20 – 2021-02-21 (×2): 40 mg via ORAL
  Filled 2021-02-20 (×2): qty 1

## 2021-02-20 MED ORDER — DULOXETINE HCL 60 MG PO CPEP
60.0000 mg | ORAL_CAPSULE | Freq: Every day | ORAL | Status: DC
  Administered 2021-02-20 – 2021-02-21 (×2): 60 mg via ORAL
  Filled 2021-02-20 (×2): qty 1

## 2021-02-20 MED ORDER — ADULT MULTIVITAMIN W/MINERALS CH
1.0000 | ORAL_TABLET | Freq: Every day | ORAL | Status: DC
  Administered 2021-02-21: 1 via ORAL
  Filled 2021-02-20: qty 1

## 2021-02-20 MED ORDER — AMLODIPINE BESYLATE 10 MG PO TABS
10.0000 mg | ORAL_TABLET | Freq: Every evening | ORAL | Status: DC
  Administered 2021-02-20: 10 mg via ORAL
  Filled 2021-02-20: qty 1

## 2021-02-20 MED ORDER — TRAZODONE HCL 100 MG PO TABS
100.0000 mg | ORAL_TABLET | Freq: Every day | ORAL | Status: DC
  Administered 2021-02-20: 100 mg via ORAL
  Filled 2021-02-20: qty 1

## 2021-02-20 MED ORDER — AZELASTINE HCL 0.1 % NA SOLN: 2.0000 | Freq: Two times a day (BID) | NASAL | Status: DC | PRN

## 2021-02-20 MED ORDER — ALBUTEROL SULFATE (2.5 MG/3ML) 0.083% IN NEBU: 2.5000 mg | INHALATION_SOLUTION | Freq: Three times a day (TID) | RESPIRATORY_TRACT | Status: DC | PRN

## 2021-02-20 MED ORDER — SODIUM CHLORIDE 0.9 % WEIGHT BASED INFUSION
3.0000 mL/kg/h | INTRAVENOUS | Status: AC
  Administered 2021-02-20: 3 mL/kg/h via INTRAVENOUS

## 2021-02-20 MED ORDER — HEPARIN (PORCINE) 25000 UT/250ML-% IV SOLN
1750.0000 [IU]/h | INTRAVENOUS | Status: DC
  Administered 2021-02-20: 1300 [IU]/h via INTRAVENOUS
  Administered 2021-02-21: 1750 [IU]/h via INTRAVENOUS
  Filled 2021-02-20 (×2): qty 250

## 2021-02-20 MED ORDER — LABETALOL HCL 200 MG PO TABS
100.0000 mg | ORAL_TABLET | Freq: Two times a day (BID) | ORAL | Status: DC
  Administered 2021-02-20 – 2021-02-21 (×2): 100 mg via ORAL
  Filled 2021-02-20 (×2): qty 1

## 2021-02-20 MED ORDER — PANTOPRAZOLE SODIUM 40 MG PO TBEC
40.0000 mg | DELAYED_RELEASE_TABLET | Freq: Every day | ORAL | Status: DC
Start: 2021-02-20 — End: 2021-02-21
  Administered 2021-02-20 – 2021-02-21 (×2): 40 mg via ORAL
  Filled 2021-02-20 (×2): qty 1

## 2021-02-20 MED ORDER — MONTELUKAST SODIUM 10 MG PO TABS
10.0000 mg | ORAL_TABLET | Freq: Every day | ORAL | Status: DC
  Administered 2021-02-20: 10 mg via ORAL
  Filled 2021-02-20: qty 1

## 2021-02-20 MED ORDER — FISH OIL 1000 MG PO CAPS: 1000.0000 mg | ORAL_CAPSULE | Freq: Two times a day (BID) | ORAL | Status: DC

## 2021-02-20 MED ORDER — TIZANIDINE HCL 4 MG PO TABS
4.0000 mg | ORAL_TABLET | Freq: Three times a day (TID) | ORAL | Status: DC | PRN
  Filled 2021-02-20: qty 1

## 2021-02-20 MED ORDER — SODIUM CHLORIDE 0.9 % IV SOLN
250.0000 mL | INTRAVENOUS | Status: DC | PRN
  Administered 2021-02-20: 250 mL via INTRAVENOUS

## 2021-02-20 MED ORDER — SODIUM CHLORIDE 0.9 % WEIGHT BASED INFUSION: 1.0000 mL/kg/h | INTRAVENOUS | Status: DC

## 2021-02-20 MED ORDER — ASPIRIN 81 MG PO CHEW: 81.0000 mg | CHEWABLE_TABLET | ORAL | Status: DC

## 2021-02-20 MED ORDER — ASPIRIN 81 MG PO CHEW
81.0000 mg | CHEWABLE_TABLET | ORAL | Status: AC
  Administered 2021-02-20: 81 mg via ORAL
  Filled 2021-02-20: qty 1

## 2021-02-20 MED ORDER — ASPIRIN 81 MG PO CHEW
81.0000 mg | CHEWABLE_TABLET | ORAL | Status: AC
  Administered 2021-02-21: 81 mg via ORAL
  Filled 2021-02-20: qty 1

## 2021-02-20 MED ORDER — BUSPIRONE HCL 5 MG PO TABS
10.0000 mg | ORAL_TABLET | Freq: Two times a day (BID) | ORAL | Status: DC
  Administered 2021-02-20 – 2021-02-21 (×2): 10 mg via ORAL
  Filled 2021-02-20 (×2): qty 2

## 2021-02-20 MED ORDER — TRAMADOL HCL 50 MG PO TABS: 50.0000 mg | ORAL_TABLET | Freq: Two times a day (BID) | ORAL | Status: DC | PRN

## 2021-02-20 MED ORDER — SODIUM CHLORIDE 0.9% FLUSH
3.0000 mL | Freq: Two times a day (BID) | INTRAVENOUS | Status: DC
  Administered 2021-02-20: 3 mL via INTRAVENOUS

## 2021-02-20 MED ORDER — SODIUM CHLORIDE 0.9% FLUSH
3.0000 mL | Freq: Two times a day (BID) | INTRAVENOUS | Status: DC
  Administered 2021-02-20 – 2021-02-21 (×2): 3 mL via INTRAVENOUS

## 2021-02-20 MED ORDER — MOMETASONE FURO-FORMOTEROL FUM 200-5 MCG/ACT IN AERO
2.0000 | INHALATION_SPRAY | Freq: Two times a day (BID) | RESPIRATORY_TRACT | Status: DC
  Filled 2021-02-20: qty 8.8

## 2021-02-20 MED ORDER — SODIUM CHLORIDE 0.9 % IV SOLN: 250.0000 mL | INTRAVENOUS | Status: DC | PRN

## 2021-02-20 MED ORDER — SODIUM CHLORIDE 0.9 % WEIGHT BASED INFUSION: 3.0000 mL/kg/h | INTRAVENOUS | Status: DC

## 2021-02-20 MED ORDER — FLUTICASONE PROPIONATE 50 MCG/ACT NA SUSP: 1.0000 | Freq: Two times a day (BID) | NASAL | Status: DC | PRN

## 2021-02-20 MED ORDER — SODIUM CHLORIDE 0.9 % WEIGHT BASED INFUSION
3.0000 mL/kg/h | INTRAVENOUS | Status: AC
  Administered 2021-02-21: 3 mL/kg/h via INTRAVENOUS

## 2021-02-20 MED ORDER — ZOLPIDEM TARTRATE 5 MG PO TABS: 10.0000 mg | ORAL_TABLET | Freq: Every evening | ORAL | Status: DC | PRN

## 2021-02-20 MED ORDER — POTASSIUM CHLORIDE CRYS ER 20 MEQ PO TBCR
20.0000 meq | EXTENDED_RELEASE_TABLET | Freq: Two times a day (BID) | ORAL | Status: DC
  Administered 2021-02-20 – 2021-02-21 (×2): 20 meq via ORAL
  Filled 2021-02-20 (×2): qty 1

## 2021-02-20 MED ORDER — NITROGLYCERIN 0.4 MG SL SUBL
SUBLINGUAL_TABLET | SUBLINGUAL | Status: AC
  Administered 2021-02-20: 0.4 mg
  Filled 2021-02-20: qty 1

## 2021-02-20 MED ORDER — HYDROCODONE-ACETAMINOPHEN 10-325 MG PO TABS
1.0000 | ORAL_TABLET | Freq: Three times a day (TID) | ORAL | Status: DC | PRN
  Administered 2021-02-20 – 2021-02-21 (×2): 1 via ORAL
  Filled 2021-02-20 (×2): qty 1

## 2021-02-20 MED ORDER — ALBUTEROL SULFATE HFA 108 (90 BASE) MCG/ACT IN AERS: 2.0000 | INHALATION_SPRAY | Freq: Four times a day (QID) | RESPIRATORY_TRACT | Status: DC | PRN

## 2021-02-20 MED ORDER — ASPIRIN EC 81 MG PO TBEC: 81.0000 mg | DELAYED_RELEASE_TABLET | Freq: Every day | ORAL | 11 refills | Status: DC

## 2021-02-20 MED ORDER — FUROSEMIDE 20 MG PO TABS: 20.0000 mg | ORAL_TABLET | Freq: Every day | ORAL | Status: DC | PRN

## 2021-02-20 MED ORDER — HEPARIN BOLUS VIA INFUSION
4000.0000 [IU] | Freq: Once | INTRAVENOUS | Status: AC
  Administered 2021-02-20: 4000 [IU] via INTRAVENOUS
  Filled 2021-02-20: qty 4000

## 2021-02-20 MED ORDER — PROMETHAZINE HCL 25 MG PO TABS: 25.0000 mg | ORAL_TABLET | Freq: Four times a day (QID) | ORAL | Status: DC | PRN

## 2021-02-20 MED ORDER — ASPIRIN EC 81 MG PO TBEC: 81.0000 mg | DELAYED_RELEASE_TABLET | Freq: Every day | ORAL | Status: DC

## 2021-02-20 NOTE — Progress Notes (Signed)
  Echocardiogram 2D Echocardiogram has been performed.  Judy Baker 02/20/2021, 4:24 PM

## 2021-02-20 NOTE — Progress Notes (Signed)
ANTICOAGULATION CONSULT NOTE - Initial Consult  Pharmacy Consult:  Heparin Indication: Unstable angina   Allergies  Allergen Reactions   Molds & Smuts    Grass Pollen([id number] Vern)     [employer] Description: Grass   Penicillins Hives and Swelling    Did it involve swelling of the face/tongue/throat, SOB, or low BP? Yes Did it involve sudden or severe rash/hives, skin peeling, or any reaction on the inside of your mouth or nose? Yes Did you need to seek medical attention at a hospital or doctor's office? Yes When did it last happen? childhood (Teenage) allergy  If all above answers are "NO", may proceed with cephalosporin use.    Sulfa Antibiotics Swelling    Patient Measurements: Height: 5\' 4"  (162.6 cm) Weight: (!) 151.6 kg (334 lb 3.2 oz) IBW/kg (Calculated) : 54.7 Heparin Dosing Weight: 93 kg  Vital Signs: Temp: 97.9 F (36.6 C) (08/23 1357) Temp Source: Oral (08/23 1357) BP: 123/74 (08/23 1415) Pulse Rate: 61 (08/23 1357)  Labs: Recent Labs    02/20/21 1622  HGB 15.3*  HCT 45.5  PLT 259  CREATININE 0.92  TROPONINIHS 3    Estimated Creatinine Clearance: 106.8 mL/min (by C-G formula based on SCr of 0.92 mg/dL).   Medical History: Past Medical History:  Diagnosis Date   Arthritis    Asthma    Carpal tunnel syndrome    GERD (gastroesophageal reflux disease) 02/04/2019   HLD (hyperlipidemia) 02/04/2019   HTN (hypertension) 02/04/2019   Hypertension    Osteoarthritis      Assessment: Judy Baker presented with chest pain that started 5-6 days PTA.  Pharmacy consulted to dose IV heparin for unstable angina.  No blood thinner PTA and baseline labs reviewed.  Goal of Therapy:  Heparin level 0.3-0.7 units/ml Monitor platelets by anticoagulation protocol: Yes   Plan:  Heparin 4000 units IV bolus, then Heparin infusion at 1300 units/hr Check 6 hr heparin level Daily heparin level and CBC Cath in AM  Makahla Kiser D. 04/06/2019, PharmD, BCPS, BCCCP 02/20/2021, 5:34 PM

## 2021-02-20 NOTE — H&P (Signed)
HISTORY AND PHYSICAL  Patient ID: Judy Baker MRN: [id number] DOB/AGE: 04-Feb-1971 50 y.o.  Admit date: 02/20/2021 Attending physician: Dr. Truett Mainland Primary Physician:  Coralee Rud, DO  Chief complaint: Chest pain  HPI:  Judy Baker is a 50 y.o. female who presents with a chief complaint of " chest pain." Patient's past medical history and cardiovascular risk factors include: Hypertension, hyperlipidemia, obesity, obstructive sleep apnea on CPAP, active smoking, obesity due to excess calories.   Patient is being followed by our practice for the management of benign essential hypertension.   Please refer to the prior office notes with regards to secondary hypertension work-up.  Today presents to the office as a sick visit for evaluation of chest pain.   Patient states that her symptoms started approximately 5 to 6 days ago on Thursday of last week.  She describes chest tightness, located substernally, getting progressively worse and on Saturday she started taking sublingual nitroglycerin tablets.  The heaviness/tightness last for about 5 minutes, intermittent throughout the day, intensity is 8 out of 10, radiates to the left arm and shoulder, associated with diaphoresis and nausea.   The patient has taken at least 2 tabs of sublingual nitroglycerin tablets on Saturday and 4 tablets of sublingual nitroglycerin on Sunday.   Patient chose not to go to the ER due to prolonged ER waiting time (due to prior experience) despite being educated on all prior office visits to seek medical attention if she were to have new onset of chest pain.   Her last ischemic work-up including a left heart catheterization was in 2020 she was found to have normal epicardial coronary arteries; however, patient states that her current symptoms are worse in intensity and frequency.   With regards to her antianginal therapy she is on labetalol, Imdur, and sublingual nitroglycerin tablets.   Her ambulatory  blood pressure monitoring notes an average blood pressure of 139/90 with a pulse of 79.   Her last episode of chest discomfort was earlier this morning but less in severity compared to the last several days.  No active chest pain during today's office visit.   Her overall functional status is also been reduced.  She notes the symptoms when she runs behind her grandkids and is not able to do as much as before.   Patient has stopped smoking cigarettes as of 01/27/2021.  She is congratulated on her efforts of now being smoke-free and is encouraged to continue to do so going forward.  ALLERGIES: Allergies  Allergen Reactions   Molds & Smuts    Grass Pollen([id number]-Swt Vern)     Allscripts Description: Grass   Penicillins Hives and Swelling    Did it involve swelling of the face/tongue/throat, SOB, or low BP? Yes Did it involve sudden or severe rash/hives, skin peeling, or any reaction on the inside of your mouth or nose? Yes Did you need to seek medical attention at a hospital or doctor's office? Yes When did it last happen? childhood (Teenage) allergy  If all above answers are "NO", may proceed with cephalosporin use.    Sulfa Antibiotics Swelling    PAST MEDICAL HISTORY: Past Medical History:  Diagnosis Date   Arthritis    Asthma    Carpal tunnel syndrome    GERD (gastroesophageal reflux disease) 02/04/2019   HLD (hyperlipidemia) 02/04/2019   HTN (hypertension) 02/04/2019   Hypertension    Osteoarthritis     PAST SURGICAL HISTORY: Past Surgical History:  Procedure Laterality Date   BREAST BIOPSY Right 12/21/2018  FIBROADENOMA/PASH   BREAST SURGERY     CARPAL TUNNEL RELEASE     CHOLECYSTECTOMY     LEFT HEART CATH AND CORONARY ANGIOGRAPHY N/A 03/30/2019   Procedure: LEFT HEART CATH AND CORONARY ANGIOGRAPHY;  Surgeon: Yates Decamp, MD;  Location: [facility];  Service: Cardiovascular;  Laterality: N/A;   TUBAL LIGATION      FAMILY HISTORY: The patient family history  includes Aneurysm in her mother; Cardiomyopathy in her brother; Heart murmur in her sister; Stroke in her father; Valvular heart disease in her mother.   SOCIAL HISTORY:  The patient  reports that she has been smoking cigarettes. She has a 5.00 pack-year smoking history. She has never used smokeless tobacco. She reports current alcohol use. She reports that she does not use drugs.  MEDICATIONS: Current Outpatient Medications  Medication Instructions   albuterol (PROVENTIL) 2.5 mg, Inhalation, 3 times daily PRN   albuterol (VENTOLIN HFA) 108 (90 Base) MCG/ACT inhaler 2 puffs, Inhalation, Every 6 hours PRN   amLODipine (NORVASC) 10 mg, Oral, Daily   aspirin EC 81 mg, Oral, Daily, Swallow whole.   azelastine (ASTELIN) 0.1 % nasal spray 2 sprays, Each Nare, 2 times daily PRN   budesonide-formoterol (SYMBICORT) 160-4.5 MCG/ACT inhaler 2 puffs, Inhalation, 2 times daily   busPIRone (BUSPAR) 10 mg, Oral, 2 times daily   DULoxetine (CYMBALTA) 60 mg, Oral, Daily   Fish Oil 1,000 mg, Oral, 2 times daily   fluticasone (FLONASE) 50 MCG/ACT nasal spray 1 spray, Each Nare, 2 times daily PRN   furosemide (LASIX) 20 mg, Oral, As needed   HYDROcodone-acetaminophen (NORCO) 10-325 MG tablet 1 tablet, Oral, 3 times daily PRN, For rotary cuff pain   labetalol (NORMODYNE) 100 MG tablet TAKE 1 TABLET BY MOUTH 2 TIMES DAILY. HOLD IF TOP BLOOD PRESSURE NUMBER LESS THAN 100 MMHG OR PULSE LESS THAN 60 BPM.   levonorgestrel (MIRENA) 20 MCG/DAY IUD Intrauterine   montelukast (SINGULAIR) 10 mg, Oral, Daily at bedtime   Multiple Vitamins-Minerals (CENTRUM SILVER 50+WOMEN) TABS 1 tablet, Oral, Daily   nitroGLYCERIN (NITROSTAT) 0.4 mg, Sublingual, Every 5 min PRN   omeprazole (PRILOSEC) 40 mg, Oral, Daily   pravastatin (PRAVACHOL) 40 mg, Oral, Daily   promethazine (PHENERGAN) 25 mg, Oral, Every 6 hours PRN   spironolactone-hydrochlorothiazide (ALDACTAZIDE) 25-25 MG tablet 1 tablet, Oral, Daily, Restarted on 01/31/20    tiZANidine (ZANAFLEX) 4 mg, Oral, 3 times daily PRN   traMADol (ULTRAM) 50 mg, Oral, Every 12 hours PRN   traZODone (DESYREL) 100 mg, Oral, Daily at bedtime   Vitamin D-3 5,000 Units, Oral, Daily   zolpidem (AMBIEN) 10 MG tablet Oral   REVIEW OF SYSTEMS: Review of Systems  Constitutional: Positive for diaphoresis. Negative for chills and fever.  HENT:  Negative for hoarse voice and nosebleeds.   Eyes:  Negative for discharge, double vision and pain.  Cardiovascular:  Positive for chest pain and dyspnea on exertion. Negative for claudication, leg swelling, near-syncope, orthopnea, palpitations, paroxysmal nocturnal dyspnea and syncope.  Respiratory:  Positive for shortness of breath. Negative for hemoptysis.   Musculoskeletal:  Negative for muscle cramps and myalgias.  Gastrointestinal:  Negative for abdominal pain, constipation, diarrhea, hematemesis, hematochezia, melena, nausea and vomiting.  Neurological:  Negative for dizziness and light-headedness.  All other systems reviewed and are negative.  PHYSICAL EXAM: Vitals with BMI 02/20/2021 11/11/2020 09/26/2020  Height 5\' 4"  - 5\' 4"   Weight 335 lbs - 329 lbs 10 oz  BMI 57.47 - 56.55  Systolic 147 168  138  Diastolic 88 70 84  Pulse 67 72 76    No intake or output data in the 24 hours ending 02/20/21 1348  Net IO Since Admission: No IO data has been entered for this period [02/20/21 1348] CONSTITUTIONAL: Appears older than stated age, obese, hemodynamically stable, no acute distress.   SKIN: Skin is warm and dry. No rash noted. No cyanosis. No pallor. No jaundice HEAD: Normocephalic and atraumatic.  EYES: No scleral icterus MOUTH/THROAT: Moist oral membranes.  NECK: No JVD present. No thyromegaly noted. No carotid bruits  LYMPHATIC: No visible cervical adenopathy.  CHEST Normal respiratory effort. No intercostal retractions  LUNGS: Clear to auscultation bilaterally.  No stridor. No wheezes. No rales.  CARDIOVASCULAR: Regular rate  and rhythm, positive S1-S2, no murmurs rubs or gallops appreciated ABDOMINAL: Obese, soft, nontender, nondistended, positive bowel sounds in all 4 quadrants.  No apparent ascites.  EXTREMITIES: No peripheral edema  HEMATOLOGIC: No significant bruising NEUROLOGIC: Oriented to person, place, and time. Nonfocal. Normal muscle tone.  PSYCHIATRIC: Normal mood and affect. Normal behavior. Cooperative  RADIOLOGY: No results found.  LABORATORY DATA: Lab Results  Component Value Date   WBC 6.4 11/04/2019   HGB 14.4 11/04/2019   HCT 42.0 11/04/2019   MCV 90 11/04/2019   PLT 294 11/04/2019   No results for input(s): NA, K, CL, CO2, BUN, CREATININE, CALCIUM, PROT, BILITOT, ALKPHOS, ALT, AST, GLUCOSE in the last 168 hours.  Invalid input(s): LABALBU  Lipid Panel  Lab Results  Component Value Date   CHOL 193 11/04/2019   HDL 67 11/04/2019   LDLCALC 114 (H) 11/04/2019   TRIG 64 11/04/2019   CHOLHDL 2.9 11/04/2019    BNP (last 3 results) No results for input(s): BNP in the last 8760 hours.  HEMOGLOBIN A1C No results found for: HGBA1C, MPG  Cardiac Panel (last 3 results) No results for input(s): CKTOTAL, CKMB, RELINDX in the last 8760 hours.  Invalid input(s): TROPONINHS  No results found for: CKTOTAL, CKMB, CKMBINDEX   TSH No results for input(s): TSH in the last 8760 hours.    CARDIAC DATABASE: EKG: 02/20/2021: Normal sinus rhythm, 66 bpm, poor R wave progression, without underlying injury pattern.    Echocardiogram: 01/20/2019: Left ventricle cavity is normal in size. Mild concentric hypertrophy of the left ventricle. Normal LV systolic function with EF 57%. Normal global wall motion. Doppler evidence of grade I (impaired) diastolic dysfunction, normal LAP. Calculated EF 57%. Left atrial cavity is mildly dilated at 3.9 cm. Mild to moderate mitral regurgitation. Mild tricuspid regurgitation. Estimated pulmonary artery systolic pressure is 28 mmHg.   Stress Testing:   Lexiscan Myoview stress test 02/22/2019: Lexiscan stress test was performed. Stress EKG is non-diagnostic, as this is pharmacological stress test. Normal myocardial thickening with LVEF 50%. SPECT stress and rest images reveal small size, mild intensity, decreased tracer uptake in apical myocardium. In addition, SPECT rest images reveal decreased tracer uptake of medium intensity in basal anteroseptal, inferoseptal myocardium. These findings are most likely due to breast attenuation in a morbidly obese patient. Clinical correlation recommended.  Intermediate risk study.   Heart Catheterization: Cath 03/30/2019: LV end diastolic pressure is normal. The left ventricular systolic function is normal. Overall normal coronary arteries   30 day event monitor 01/19-02/18/2021: Normal sinus rhythm. Average HR 79 bpm. Max HR 158 bpm, Min HR 50 bpm. Symptoms of flutter/skipped beats, shortness of breath, dizziness correlated with Sinus rhythm with rare PVC. 1 episode of flutter/skipped beats, shortness of breath correlated with Sinus  tachycardia at 108 bpm. No A fib or SVT was noted  IMPRESSION & RECOMMENDATIONS: Judy Baker is a 50 y.o. African-American female whose past medical history and cardiovascular risk factors include: Hypertension, hyperlipidemia, obesity, obstructive sleep apnea on CPAP, active smoking, obesity due to excess calories.   Unstable angina (HCC) Patient's symptoms very concerning of cardiac chest pain. EKG did not illustrate underlying ischemia or injury pattern. But has been taking several sublingual nitroglycerin tablets over the last several days. Her last episode of chest pain was earlier this morning.  No active chest pain during today's encounter Am very concerned that she may have obstructive CAD. Her last ischemic evaluation was in 2020 including a left heart catheterization.  When asked if her symptoms are any different than what she experienced in 2020 patient states  that they are more intense and more frequent. I offered her to go to the ER via EMS from the office but she refuses and would rather be electively admitted. Requesting a hospital bed with telemetry on a more urgent basis for further evaluation. Start aspirin 81 mg p.o. daily. Check CBC, BMP, BNP, magnesium level.   Resistant hypertension Office blood pressures within acceptable range but not at goal. Medications reconciled. Has undergone work-up for secondary hypertension, please refer to prior office notes.   PVC (premature ventricular contraction) Well-controlled. Currently on labetalol.   Pure hypercholesterolemia Continue pravastatin. We will check a fasting lipid profile in the hospital.   OSA on CPAP Patient is compliant with her CPAP machine.  Encouraged to continue to do so..  Consultants: None   Code Status: Full    Family Communication:  None    Disposition Plan: Home.    Patient's questions and concerns were addressed to her satisfaction. She voices understanding of the instructions provided during this encounter.   This note was created using a voice recognition software as a result there may be grammatical errors inadvertently enclosed that do not reflect the nature of this encounter. Every attempt is made to correct such errors.  Patient was seen in office earlier today and is now admitted as an elective admission.  The office visit has been transposed as today's H&P for reference.  Office visit has not been billed as she will be billed for H&P.  Judy ShanSunit Winta Barcelo, DO, [facility] CenterFACC  Pager: [phone] Office: [phone] 02/20/2021, 1:48 PM

## 2021-02-20 NOTE — Progress Notes (Signed)
Judy Baker Date of Birth: 1970-07-02 MRN: [id number] Primary Care Provider:Scheer, Fayrene Fearing, DO Former Cardiology Providers: [place], APRN, FNP-C Primary Cardiologist: Tessa Lerner, DO, [facility] (established care 01/04/2020)  Date: 02/20/21 Last Office Visit: 09/26/2020  Chief Complaint  Patient presents with   Chest Pain    Indigestion   Follow-up    HPI  Judy Baker is a 50 y.o.  female who presents to the office with a chief complaint of " chest pain." Patient's past medical history and cardiovascular risk factors include: Hypertension, hyperlipidemia, obesity, obstructive sleep apnea on CPAP, active smoking, obesity due to excess calories.  Patient is being followed by our practice for the management of benign essential hypertension.  Please refer to the prior office notes with regards to secondary hypertension work-up.  Today presents to the office as a sick visit for evaluation of chest pain.  Patient states that her symptoms started approximately 5 to 6 days ago on Thursday of last week.  She describes chest tightness, located substernally, getting progressively worse and on Saturday she started taking sublingual nitroglycerin tablets.  The heaviness/tightness last for about 5 minutes, intermittent throughout the day, intensity is 8 out of 10, radiates to the left arm and shoulder, associated with diaphoresis and nausea.  The patient has taken at least 2 tabs of sublingual nitroglycerin tablets on Saturday and 4 tablets of sublingual nitroglycerin on Sunday.  Patient chose not to go to the ER due to prolonged ER waiting time (due to prior experience) despite being educated on all prior office visits to seek medical attention if she were to have new onset of chest pain.  Her last ischemic work-up including a left heart catheterization was in 2020 she was found to have normal epicardial coronary arteries; however, patient states that her current symptoms are worse in intensity  and frequency.  With regards to her antianginal therapy she is on labetalol, Imdur, and sublingual nitroglycerin tablets.  Her ambulatory blood pressure monitoring notes an average blood pressure of 139/90 with a pulse of 79.  Her last episode of chest discomfort was earlier this morning but less in severity compared to the last several days.  No active chest pain during today's office visit.  Her overall functional status is also been reduced.  She notes the symptoms when she runs behind her grandkids and is not able to do as much as before.  Patient has stopped smoking cigarettes as of 01/27/2021.  She is congratulated on her efforts of now being smoke-free and is encouraged to continue to do so going forward.  ALLERGIES: Allergies  Allergen Reactions   Molds & Smuts    Grass Pollen(K-O-R-T-Swt Vern)     Allscripts Description: Grass   Penicillins Hives and Swelling    Did it involve swelling of the face/tongue/throat, SOB, or low BP? Yes Did it involve sudden or severe rash/hives, skin peeling, or any reaction on the inside of your mouth or nose? Yes Did you need to seek medical attention at a hospital or doctor's office? Yes When did it last happen? childhood (Teenage) allergy  If all above answers are "NO", may proceed with cephalosporin use.    Sulfa Antibiotics Swelling     MEDICATION LIST PRIOR TO VISIT: Current Outpatient Medications on File Prior to Visit  Medication Sig Dispense Refill   albuterol (PROVENTIL) (2.5 MG/3ML) 0.083% nebulizer solution Inhale 2.5 mg into the lungs 3 (three) times daily as needed for wheezing or shortness of breath.  albuterol (VENTOLIN HFA) 108 (90 Base) MCG/ACT inhaler Inhale 2 puffs into the lungs every 6 (six) hours as needed for wheezing or shortness of breath.     azelastine (ASTELIN) 0.1 % nasal spray Place 2 sprays into both nostrils 2 (two) times daily as needed (allergies).      budesonide-formoterol (SYMBICORT) 160-4.5 MCG/ACT  inhaler Inhale 2 puffs into the lungs in the morning and at bedtime.     busPIRone (BUSPAR) 10 MG tablet Take 10 mg by mouth 2 (two) times daily.     Cholecalciferol (VITAMIN D-3) 125 MCG (5000 UT) TABS Take 5,000 Units by mouth daily.     DULoxetine (CYMBALTA) 60 MG capsule Take 60 mg by mouth daily.     fluticasone (FLONASE) 50 MCG/ACT nasal spray Place 1 spray into both nostrils 2 (two) times daily as needed for allergies.      furosemide (LASIX) 20 MG tablet Take 20 mg by mouth as needed.     HYDROcodone-acetaminophen (NORCO) 10-325 MG tablet Take 1 tablet by mouth 3 (three) times daily as needed (for pain). For rotary cuff pain     labetalol (NORMODYNE) 100 MG tablet TAKE 1 TABLET BY MOUTH 2 TIMES DAILY. HOLD IF TOP BLOOD PRESSURE NUMBER LESS THAN 100 MMHG OR PULSE LESS THAN 60 BPM. 180 tablet 0   levonorgestrel (MIRENA) 20 MCG/DAY IUD by Intrauterine route.     montelukast (SINGULAIR) 10 MG tablet Take 10 mg by mouth at bedtime.     Multiple Vitamins-Minerals (CENTRUM SILVER 50+WOMEN) TABS Take 1 tablet by mouth daily.     nitroGLYCERIN (NITROSTAT) 0.4 MG SL tablet PLACE 1 TABLET (0.4 MG TOTAL) UNDER THE TONGUE EVERY 5 (FIVE) MINUTES AS NEEDED FOR CHEST PAIN. 25 tablet 1   Omega-3 Fatty Acids (FISH OIL) 1000 MG CAPS Take 1,000 mg by mouth 2 (two) times daily.      omeprazole (PRILOSEC) 40 MG capsule Take 40 mg by mouth daily.     pravastatin (PRAVACHOL) 40 MG tablet Take 40 mg by mouth daily.     promethazine (PHENERGAN) 25 MG tablet Take 25 mg by mouth every 6 (six) hours as needed for nausea or vomiting.      spironolactone-hydrochlorothiazide (ALDACTAZIDE) 25-25 MG tablet Take 1 tablet by mouth daily. Restarted on 01/31/20     tiZANidine (ZANAFLEX) 4 MG tablet Take 4 mg by mouth 3 (three) times daily as needed for muscle spasms.      traMADol (ULTRAM) 50 MG tablet Take 50 mg by mouth every 12 (twelve) hours as needed (pain).      traZODone (DESYREL) 100 MG tablet Take 100 mg by mouth at  bedtime.     zolpidem (AMBIEN) 10 MG tablet Take by mouth.     amLODipine (NORVASC) 10 MG tablet Take 1 tablet (10 mg total) by mouth daily. (Patient taking differently: Take 10 mg by mouth daily. Taking in the morning) 30 tablet 2   No current facility-administered medications on file prior to visit.    PAST MEDICAL HISTORY: Past Medical History:  Diagnosis Date   Arthritis    Asthma    Carpal tunnel syndrome    GERD (gastroesophageal reflux disease) 02/04/2019   HLD (hyperlipidemia) 02/04/2019   HTN (hypertension) 02/04/2019   Hypertension    Osteoarthritis     PAST SURGICAL HISTORY: Past Surgical History:  Procedure Laterality Date   BREAST BIOPSY Right 12/21/2018   [facility]   BREAST SURGERY     CARPAL TUNNEL RELEASE  CHOLECYSTECTOMY     LEFT HEART CATH AND CORONARY ANGIOGRAPHY N/A 03/30/2019   Procedure: LEFT HEART CATH AND CORONARY ANGIOGRAPHY;  Surgeon: Yates Decamp, MD;  Location: [facility] INVASIVE CV LAB;  Service: Cardiovascular;  Laterality: N/A;   TUBAL LIGATION      FAMILY HISTORY: The patient's family history includes Aneurysm in her mother; Cardiomyopathy in her brother; Heart murmur in her sister; Stroke in her father; Valvular heart disease in her mother.   SOCIAL HISTORY:  The patient  reports that she has been smoking cigarettes. She has a 5.00 pack-year smoking history. She has never used smokeless tobacco. She reports current alcohol use. She reports that she does not use drugs.  Review of Systems  Constitutional: Positive for diaphoresis. Negative for chills and fever.  HENT:  Negative for hoarse voice and nosebleeds.   Eyes:  Negative for discharge, double vision and pain.  Cardiovascular:  Positive for chest pain and dyspnea on exertion. Negative for claudication, leg swelling, near-syncope, orthopnea, palpitations, paroxysmal nocturnal dyspnea and syncope.  Respiratory:  Positive for shortness of breath. Negative for hemoptysis.   Musculoskeletal:   Negative for muscle cramps and myalgias.  Gastrointestinal:  Negative for abdominal pain, constipation, diarrhea, hematemesis, hematochezia, melena, nausea and vomiting.  Neurological:  Negative for dizziness and light-headedness.   PHYSICAL EXAM: Vitals with BMI 02/20/2021 11/11/2020 09/26/2020  Height  -   Weight 335 lbs - 329 lbs 10 oz  BMI 57.47 - 56.55  Systolic 147 168 960  Diastolic 88 70 84  Pulse 67 72 76    CONSTITUTIONAL: Appears older than stated age, obese, hemodynamically stable, no acute distress.   SKIN: Skin is warm and dry. No rash noted. No cyanosis. No pallor. No jaundice HEAD: Normocephalic and atraumatic.  EYES: No scleral icterus MOUTH/THROAT: Moist oral membranes.  NECK: No JVD present. No thyromegaly noted. No carotid bruits  LYMPHATIC: No visible cervical adenopathy.  CHEST Normal respiratory effort. No intercostal retractions  LUNGS: Clear to auscultation bilaterally.  No stridor. No wheezes. No rales.  CARDIOVASCULAR: Regular rate and rhythm, positive S1-S2, no murmurs rubs or gallops appreciated ABDOMINAL: Obese, soft, nontender, nondistended, positive bowel sounds in all 4 quadrants.  No apparent ascites.  EXTREMITIES: No peripheral edema  HEMATOLOGIC: No significant bruising NEUROLOGIC: Oriented to person, place, and time. Nonfocal. Normal muscle tone.  PSYCHIATRIC: Normal mood and affect. Normal behavior. Cooperative  CARDIAC DATABASE: EKG: 02/20/2021: Normal sinus rhythm, 66 bpm, poor R wave progression, without underlying injury pattern.   Echocardiogram: 01/20/2019: Left ventricle cavity is normal in size. Mild concentric hypertrophy of the left ventricle. Normal LV systolic function with EF 57%. Normal global wall motion. Doppler evidence of grade I (impaired) diastolic dysfunction, normal LAP. Calculated EF 57%. Left atrial cavity is mildly dilated at 3.9 cm. Mild to moderate mitral regurgitation. Mild tricuspid regurgitation.  Estimated pulmonary artery systolic pressure is 28 mmHg.  Stress Testing:  Lexiscan Myoview stress test 02/22/2019: Lexiscan stress test was performed. Stress EKG is non-diagnostic, as this is pharmacological stress test. Normal myocardial thickening with LVEF 50%. SPECT stress and rest images reveal small size, mild intensity, decreased tracer uptake in apical myocardium. In addition, SPECT rest images reveal decreased tracer uptake of medium intensity in basal anteroseptal, inferoseptal myocardium. These findings are most likely due to breast attenuation in a morbidly obese patient. Clinical correlation recommended.  Intermediate risk study.  Heart Catheterization: Cath 03/30/2019: LV end diastolic pressure is normal. The left ventricular systolic function is normal. Overall  normal coronary arteries  30 day event monitor 01/19-02/18/2021: Normal sinus rhythm. Average HR 79 bpm. Max HR 158 bpm, Min HR 50 bpm. Symptoms of flutter/skipped beats, shortness of breath, dizziness correlated with Sinus rhythm with rare PVC. 1 episode of flutter/skipped beats, shortness of breath correlated with Sinus tachycardia at 108 bpm. No A fib or SVT was noted  LABORATORY DATA: CBC Latest Ref Rng & Units 11/04/2019 05/13/2019 03/18/2019  WBC 3.4 - 10.8 x10E3/uL 6.4 7.8 5.4  Hemoglobin 11.1 - 15.9 g/dL 16.114.4 09.614.1 04.514.7  Hematocrit 34.0 - 46.6 % 42.0 43.9 45.2  Platelets 150 - 450 x10E3/uL 294 298 262    CMP Latest Ref Rng & Units 02/11/2020 01/18/2020 11/04/2019  Glucose 65 - 99 mg/dL 93 88 81  BUN 6 - 24 mg/dL 14 14 10   Creatinine 0.57 - 1.00 mg/dL 4.090.80 8.110.94 9.140.78  Sodium 134 - 144 mmol/L 138 144 142  Potassium 3.5 - 5.2 mmol/L 3.6 4.6 4.3  Chloride 96 - 106 mmol/L 98 105 102  CO2 20 - 29 mmol/L 23 21 25   Calcium 8.7 - 10.2 mg/dL 78.210.0 9.9 9.6  Total Protein 6.0 - 8.5 g/dL - 6.9 6.9  Total Bilirubin 0.0 - 1.2 mg/dL - 0.4 0.3  Alkaline Phos 48 - 121 IU/L - 111 115  AST 0 - 40 IU/L - 18 18  ALT IU/L - 20 26     Lipid Panel     Component Value Date/Time   CHOL 193 11/04/2019 1546   TRIG 64 11/04/2019 1546   HDL 67 11/04/2019 1546   CHOLHDL 2.9 11/04/2019 1546   LDLCALC 114 (H) 11/04/2019 1546   LABVLDL 12 11/04/2019 1546    No results found for: HGBA1C No components found for: NTPROBNP Lab Results  Component Value Date   TSH 1.140 11/04/2019    Cardiac Panel (last 3 results) No results for input(s): CKTOTAL, CKMB, TROPONINIHS, RELINDX in the last 72 hours.  IMPRESSION:    ICD-10-CM   1. Unstable angina (HCC)  I20.0 EKG 12-Lead    2. Resistant hypertension  I10     3. PVC (premature ventricular contraction)  I49.3     4. Pure hypercholesterolemia  E78.00     5. OSA on CPAP  G47.33    Z99.89     6. Class 3 severe obesity due to excess calories without serious comorbidity with body mass index (BMI) of 50.0 to 59.9 in adult [facility](HCC)  E66.01    Z68.43        RECOMMENDATIONS: Judy Olszewskingela Baltazar is a 50 y.o. female whose past medical history and cardiovascular risk factors include: Hypertension, hyperlipidemia, obesity, obstructive sleep apnea on CPAP, active smoking, obesity due to excess calories.  Unstable angina (HCC) Patient's symptoms very concerning of cardiac chest pain. EKG did not illustrate underlying ischemia or injury pattern. But has been taking several sublingual nitroglycerin tablets over the last several days. Her last episode of chest pain was earlier this morning.  No active chest pain during today's encounter Am very concerned that she may have obstructive CAD. Her last ischemic evaluation was in 2020 including a left heart catheterization.  When asked if her symptoms are any different than what she experienced in 2020 patient states that they are more intense and more frequent. I offered her to go to the ER via EMS from the office but she refuses and would rather be electively admitted. Requesting a hospital bed with telemetry on a more urgent basis for further  evaluation. Start  aspirin 81 mg p.o. daily.  Resistant hypertension Office blood pressures within acceptable range but not at goal. Medications reconciled. Has undergone work-up for secondary hypertension, please refer to prior office notes.  PVC (premature ventricular contraction) Well-controlled. Currently on labetalol.  Pure hypercholesterolemia Continue pravastatin. We will check a fasting lipid profile in the hospital.  OSA on CPAP Patient is compliant with her CPAP machine.  Encouraged to continue to do so.   FINAL MEDICATION LIST END OF ENCOUNTER: No orders of the defined types were placed in this encounter.    Current Outpatient Medications:    albuterol (PROVENTIL) (2.5 MG/3ML) 0.083% nebulizer solution, Inhale 2.5 mg into the lungs 3 (three) times daily as needed for wheezing or shortness of breath. , Disp: , Rfl:    albuterol (VENTOLIN HFA) 108 (90 Base) MCG/ACT inhaler, Inhale 2 puffs into the lungs every 6 (six) hours as needed for wheezing or shortness of breath., Disp: , Rfl:    azelastine (ASTELIN) 0.1 % nasal spray, Place 2 sprays into both nostrils 2 (two) times daily as needed (allergies). , Disp: , Rfl:    budesonide-formoterol (SYMBICORT) 160-4.5 MCG/ACT inhaler, Inhale 2 puffs into the lungs in the morning and at bedtime., Disp: , Rfl:    busPIRone (BUSPAR) 10 MG tablet, Take 10 mg by mouth 2 (two) times daily., Disp: , Rfl:    Cholecalciferol (VITAMIN D-3) 125 MCG (5000 UT) TABS, Take 5,000 Units by mouth daily., Disp: , Rfl:    DULoxetine (CYMBALTA) 60 MG capsule, Take 60 mg by mouth daily., Disp: , Rfl:    fluticasone (FLONASE) 50 MCG/ACT nasal spray, Place 1 spray into both nostrils 2 (two) times daily as needed for allergies. , Disp: , Rfl:    furosemide (LASIX) 20 MG tablet, Take 20 mg by mouth as needed., Disp: , Rfl:    HYDROcodone-acetaminophen (NORCO) 10-325 MG tablet, Take 1 tablet by mouth 3 (three) times daily as needed (for pain). For rotary cuff  pain, Disp: , Rfl:    labetalol (NORMODYNE) 100 MG tablet, TAKE 1 TABLET BY MOUTH 2 TIMES DAILY. HOLD IF TOP BLOOD PRESSURE NUMBER LESS THAN 100 MMHG OR PULSE LESS THAN 60 BPM., Disp: 180 tablet, Rfl: 0   levonorgestrel (MIRENA) 20 MCG/DAY IUD, by Intrauterine route., Disp: , Rfl:    montelukast (SINGULAIR) 10 MG tablet, Take 10 mg by mouth at bedtime., Disp: , Rfl:    Multiple Vitamins-Minerals (CENTRUM SILVER 50+WOMEN) TABS, Take 1 tablet by mouth daily., Disp: , Rfl:    nitroGLYCERIN (NITROSTAT) 0.4 MG SL tablet, PLACE 1 TABLET (0.4 MG TOTAL) UNDER THE TONGUE EVERY 5 (FIVE) MINUTES AS NEEDED FOR CHEST PAIN., Disp: 25 tablet, Rfl: 1   Omega-3 Fatty Acids (FISH OIL) 1000 MG CAPS, Take 1,000 mg by mouth 2 (two) times daily. , Disp: , Rfl:    omeprazole (PRILOSEC) 40 MG capsule, Take 40 mg by mouth daily., Disp: , Rfl:    pravastatin (PRAVACHOL) 40 MG tablet, Take 40 mg by mouth daily., Disp: , Rfl:    promethazine (PHENERGAN) 25 MG tablet, Take 25 mg by mouth every 6 (six) hours as needed for nausea or vomiting. , Disp: , Rfl:    spironolactone-hydrochlorothiazide (ALDACTAZIDE) 25-25 MG tablet, Take 1 tablet by mouth daily. Restarted on 01/31/20, Disp: , Rfl:    tiZANidine (ZANAFLEX) 4 MG tablet, Take 4 mg by mouth 3 (three) times daily as needed for muscle spasms. , Disp: , Rfl:    traMADol (ULTRAM) 50 MG tablet, Take  50 mg by mouth every 12 (twelve) hours as needed (pain). , Disp: , Rfl:    traZODone (DESYREL) 100 MG tablet, Take 100 mg by mouth at bedtime., Disp: , Rfl:    zolpidem (AMBIEN) 10 MG tablet, Take by mouth., Disp: , Rfl:    amLODipine (NORVASC) 10 MG tablet, Take 1 tablet (10 mg total) by mouth daily. (Patient taking differently: Take 10 mg by mouth daily. Taking in the morning), Disp: 30 tablet, Rfl: 2  Orders Placed This Encounter  Procedures   EKG 12-Lead   --Continue cardiac medications as reconciled in final medication list. --Return in about 2 weeks (around 03/06/2021) for  Reevaluation of, Chest pain, Review test results. Or sooner if needed. --Continue follow-up with your primary care physician regarding the management of your other chronic comorbid conditions.  Patient's questions and concerns were addressed to her satisfaction. She voices understanding of the instructions provided during this encounter.   This note was created using a voice recognition software as a result there may be grammatical errors inadvertently enclosed that do not reflect the nature of this encounter. Every attempt is made to correct such errors.  Total time spent: 45 minutes addressing an acute onset of chest pain starting last week, discussed disease management, recommended EMS transfer to ED for further evaluation and management.  Patient refuses to go to the ED at the current time and would like to be electively admitted as that she does not want to wait in the ED.  I highly recommended against the elective admission as chest pain can be a life-threatening condition given her symptoms and more urgent/emergent evaluation is imperative.  However patient was persistent that she rather be admitted electively, but if the symptoms were to worsen she will call EMS herself and go to the nearest ER.  Hospital bed has been requested.  Tessa Lerner, [place], [facility]  Pager: [phone] Office: [phone]

## 2021-02-20 NOTE — Progress Notes (Signed)
No acute ischemic changes on EKG. Labs are still pending. We will plan on pain control with IV NTG overnight, and cath tomorrow morning first case at 8:15. Patient can eat tonight, NPO after midnight.   Elder Negus, MD Pager: [phone] Office: [phone]

## 2021-02-21 ENCOUNTER — Ambulatory Visit (HOSPITAL_COMMUNITY)
Admission: AD | Disposition: A | Discharge status: Home / Self Care | Payer: Self-pay | Source: Ambulatory Visit | Attending: Cardiology

## 2021-02-21 ENCOUNTER — Encounter (HOSPITAL_COMMUNITY): Payer: Self-pay | Admitting: Cardiology

## 2021-02-21 DIAGNOSIS — R0789 Other chest pain: Secondary | ICD-10-CM | POA: Diagnosis not present

## 2021-02-21 HISTORY — PX: LEFT HEART CATH AND CORONARY ANGIOGRAPHY: CATH118249

## 2021-02-21 LAB — CBC
HCT: 44.1 % (ref 36.0–46.0)
Hemoglobin: 14.8 g/dL (ref 12.0–15.0)
MCH: 31.2 pg (ref 26.0–34.0)
MCHC: 33.6 g/dL (ref 30.0–36.0)
MCV: 92.8 fL (ref 80.0–100.0)
Platelets: 233 10*3/uL (ref 150–400)
RBC: 4.75 MIL/uL (ref 3.87–5.11)
RDW: 13.4 % (ref 11.5–15.5)
WBC: 6.9 10*3/uL (ref 4.0–10.5)
nRBC: 0 % (ref 0.0–0.2)

## 2021-02-21 LAB — BASIC METABOLIC PANEL
Anion gap: 7 (ref 5–15)
BUN: 15 mg/dL (ref 6–20)
CO2: 28 mmol/L (ref 22–32)
Calcium: 9.4 mg/dL (ref 8.9–10.3)
Chloride: 101 mmol/L (ref 98–111)
Creatinine, Ser: 0.96 mg/dL (ref 0.44–1.00)
GFR, Estimated: >60 mL/min (ref >60)
Glucose, Bld: 111 mg/dL — ABNORMAL HIGH (ref 70–99)
Potassium: 3.7 mmol/L (ref 3.5–5.1)
Sodium: 136 mmol/L (ref 135–145)

## 2021-02-21 LAB — HEPARIN LEVEL (UNFRACTIONATED): Heparin Unfractionated: 0.11 IU/mL — ABNORMAL LOW (ref 0.30–0.70)

## 2021-02-21 SURGERY — LEFT HEART CATH AND CORONARY ANGIOGRAPHY
Anesthesia: LOCAL

## 2021-02-21 MED ORDER — SODIUM CHLORIDE 0.9 % IV SOLN: INTRAVENOUS | Status: AC

## 2021-02-21 MED ORDER — SODIUM CHLORIDE 0.9% FLUSH: 3.0000 mL | Freq: Two times a day (BID) | INTRAVENOUS | Status: DC

## 2021-02-21 MED ORDER — MIDAZOLAM HCL 2 MG/2ML IJ SOLN: INTRAMUSCULAR | Status: DC | PRN

## 2021-02-21 MED ORDER — MIDAZOLAM HCL 2 MG/2ML IJ SOLN
INTRAMUSCULAR | Status: AC
  Filled 2021-02-21: qty 2

## 2021-02-21 MED ORDER — LIDOCAINE HCL (PF) 1 % IJ SOLN
INTRAMUSCULAR | Status: AC
  Filled 2021-02-21: qty 30

## 2021-02-21 MED ORDER — LIDOCAINE HCL (PF) 1 % IJ SOLN
INTRAMUSCULAR | Status: DC | PRN
  Administered 2021-02-21: 2 mL

## 2021-02-21 MED ORDER — VERAPAMIL HCL 2.5 MG/ML IV SOLN
INTRAVENOUS | Status: DC | PRN
  Administered 2021-02-21: 10 mL via INTRA_ARTERIAL

## 2021-02-21 MED ORDER — HYDRALAZINE HCL 20 MG/ML IJ SOLN: 10.0000 mg | INTRAMUSCULAR | Status: AC | PRN

## 2021-02-21 MED ORDER — HEPARIN SODIUM (PORCINE) 1000 UNIT/ML IJ SOLN
INTRAMUSCULAR | Status: DC | PRN
  Administered 2021-02-21: 6000 [IU] via INTRAVENOUS

## 2021-02-21 MED ORDER — SODIUM CHLORIDE 0.9 % IV SOLN: 250.0000 mL | INTRAVENOUS | Status: DC | PRN

## 2021-02-21 MED ORDER — ACETAMINOPHEN 325 MG PO TABS
650.0000 mg | ORAL_TABLET | ORAL | Status: DC | PRN
Start: 2021-02-21 — End: 2021-02-21

## 2021-02-21 MED ORDER — HEPARIN (PORCINE) IN NACL 1000-0.9 UT/500ML-% IV SOLN
INTRAVENOUS | Status: AC
  Filled 2021-02-21: qty 1000

## 2021-02-21 MED ORDER — VERAPAMIL HCL 2.5 MG/ML IV SOLN
INTRAVENOUS | Status: AC
  Filled 2021-02-21: qty 2

## 2021-02-21 MED ORDER — IOHEXOL 350 MG/ML SOLN
INTRAVENOUS | Status: DC | PRN
  Administered 2021-02-21: 25 mL

## 2021-02-21 MED ORDER — FENTANYL CITRATE (PF) 100 MCG/2ML IJ SOLN
INTRAMUSCULAR | Status: AC
  Filled 2021-02-21: qty 2

## 2021-02-21 MED ORDER — SODIUM CHLORIDE 0.9% FLUSH: 3.0000 mL | INTRAVENOUS | Status: DC | PRN

## 2021-02-21 MED ORDER — HEPARIN (PORCINE) IN NACL 1000-0.9 UT/500ML-% IV SOLN
INTRAVENOUS | Status: DC | PRN
  Administered 2021-02-21 (×2): 500 mL

## 2021-02-21 MED ORDER — FENTANYL CITRATE (PF) 100 MCG/2ML IJ SOLN: INTRAMUSCULAR | Status: DC | PRN

## 2021-02-21 MED ORDER — HEPARIN BOLUS VIA INFUSION
2500.0000 [IU] | Freq: Once | INTRAVENOUS | Status: AC
  Administered 2021-02-21: 2500 [IU] via INTRAVENOUS
  Filled 2021-02-21: qty 2500

## 2021-02-21 MED ORDER — ONDANSETRON HCL 4 MG/2ML IJ SOLN: 4.0000 mg | Freq: Four times a day (QID) | INTRAMUSCULAR | Status: DC | PRN

## 2021-02-21 MED ORDER — LABETALOL HCL 5 MG/ML IV SOLN: 10.0000 mg | INTRAVENOUS | Status: AC | PRN

## 2021-02-21 SURGICAL SUPPLY — 9 items
CATH OPTITORQUE TIG 4.0 5F (CATHETERS) ×2 IMPLANT
DEVICE RAD COMP TR BAND LRG (VASCULAR PRODUCTS) ×2 IMPLANT
GLIDESHEATH SLEND A-KIT 6F 22G (SHEATH) ×2 IMPLANT
GUIDEWIRE INQWIRE 1.5J.035X260 (WIRE) ×1 IMPLANT
INQWIRE 1.5J .035X260CM (WIRE) ×2
KIT HEART LEFT (KITS) ×2 IMPLANT
PACK CARDIAC CATHETERIZATION (CUSTOM PROCEDURE TRAY) ×2 IMPLANT
TRANSDUCER W/STOPCOCK (MISCELLANEOUS) ×2 IMPLANT
TUBING CIL FLEX 10 FLL-RA (TUBING) ×2 IMPLANT

## 2021-02-21 NOTE — Progress Notes (Signed)
ANTICOAGULATION CONSULT NOTE  Pharmacy Consult:  Heparin Indication: Unstable angina   Allergies  Allergen Reactions   Molds & Smuts    Grass Pollen(K-O-R-T-Swt Vern)     Allscripts Description: Grass   Penicillins Hives and Swelling    Did it involve swelling of the face/tongue/throat, SOB, or low BP? Yes Did it involve sudden or severe rash/hives, skin peeling, or any reaction on the inside of your mouth or nose? Yes Did you need to seek medical attention at a hospital or doctor's office? Yes When did it last happen? childhood (Teenage) allergy  If all above answers are "NO", may proceed with cephalosporin use.    Sulfa Antibiotics Swelling    Patient Measurements: Height: 5\' 4"  (162.6 cm) Weight: (!) 151.6 kg (334 lb 3.2 oz) IBW/kg (Calculated) : 54.7 Heparin Dosing Weight: 93 kg  Vital Signs: Temp: 98.4 F (36.9 C) (08/23 2340) Temp Source: Oral (08/23 2340) BP: 107/76 (08/23 2340) Pulse Rate: 69 (08/23 2340)  Labs: Recent Labs    02/20/21 1622 02/20/21 1831 02/21/21 0054  HGB 15.3*  --  14.8  HCT 45.5  --  44.1  PLT 259  --  233  HEPARINUNFRC  --   --  0.11*  CREATININE 0.92  --   --   TROPONINIHS 3 3  --      Estimated Creatinine Clearance: 106.8 mL/min (by C-G formula based on SCr of 0.92 mg/dL).  Assessment: 78 YOF presented with chest pain that started 5-6 days PTA.  Pharmacy consulted to dose IV heparin for unstable angina.  No blood thinner PTA and baseline labs reviewed.   Heparin level subtherapeutic (0.11) on gtt at 1400 units/hr. RN states that IV line has been beeping some but they have been fixing quickly. No bleeding noted.  Plan for cath at 0815.  Goal of Therapy:  Heparin level 0.3-0.7 units/ml Monitor platelets by anticoagulation protocol: Yes   Plan:  Rebolus heparin 2500 units Increase heparin infusion to 1750 units/hr F/u post cath  44, PharmD, BCPS Please see amion for complete clinical pharmacist phone  list 02/21/2021, 2:11 AM

## 2021-02-21 NOTE — Interval H&P Note (Signed)
History and Physical Interval Note:  02/21/2021 8:23 AM  Judy Baker  has presented today for surgery, with the diagnosis of unstable angina.  The various methods of treatment have been discussed with the patient and family. After consideration of risks, benefits and other options for treatment, the patient has consented to  Procedure(s): LEFT HEART CATH AND CORONARY ANGIOGRAPHY (N/A) as a surgical intervention.  The patient's history has been reviewed, patient examined, no change in status, stable for surgery.  I have reviewed the patient's chart and labs.  Questions were answered to the patient's satisfaction.    AUC app unavailable Unstable angina  Katalea Ucci J Tayva Easterday

## 2021-02-21 NOTE — Discharge Summary (Signed)
Physician Discharge Summary  Patient ID: Jeff Mccallum MRN: [id number] DOB/AGE: 1971-06-06 50 y.o.  Admit date: 02/20/2021 Discharge date: 02/21/2021  Primary Discharge Diagnosis: Chest pain  Secondary Discharge Diagnosis: Hypertension Type 2 DM Obesity OSA   Hospital Course:   50 y.o. female with who presents with hypertension, hyperlipidemia, obesity, obstructive sleep apnea on CPAP, active smoking, obesity due to excess calories, admitted with suspected unstable angina  Patient ahd nitrate responsive pain. However, coronary arteries are completely normal with normal TIMI III flow. Consider noncardiac etiology for chest pain, such as esophageal spasm  Discharge Exam: Blood pressure 125/66, pulse 62, temperature 98.2 F (36.8 C), temperature source Oral, resp. rate 11, height 5\' 4"  (1.626 m), weight (!) 151.6 kg, SpO2 100 %.   Physical Exam Vitals and nursing note reviewed.  Constitutional:      General: She is not in acute distress.    Appearance: She is well-developed. She is obese.  HENT:     Head: Normocephalic and atraumatic.  Eyes:     Conjunctiva/sclera: Conjunctivae normal.     Pupils: Pupils are equal, round, and reactive to light.  Neck:     Vascular: No JVD.  Cardiovascular:     Rate and Rhythm: Normal rate and regular rhythm.     Pulses: Normal pulses and intact distal pulses.     Heart sounds: No murmur heard. Pulmonary:     Effort: Pulmonary effort is normal.     Breath sounds: Normal breath sounds. No wheezing or rales.  Abdominal:     General: Bowel sounds are normal.     Palpations: Abdomen is soft.     Tenderness: There is no rebound.  Musculoskeletal:        General: No tenderness. Normal range of motion.     Left lower leg: No edema.  Lymphadenopathy:     Cervical: No cervical adenopathy.  Skin:    General: Skin is warm and dry.  Neurological:     Mental Status: She is alert and oriented to person, place, and time.     Cranial Nerves: No  cranial nerve deficit.    Significant Diagnostic Studies:  Coronary angiography 02/21/2021: Right dominant circulation with no coronary artery disease Normal LVEDP   Consider noncardiac etiology for chest pain   Echocardiogram 02/20/2021:  1. Left ventricular ejection fraction, by estimation, is 50 to 55%. The  left ventricle has low normal function. The left ventricle has no regional  wall motion abnormalities. There is mild left ventricular hypertrophy.  Left ventricular diastolic  parameters were normal.   2. Right ventricular systolic function is normal. The right ventricular  size is normal.   3. The mitral valve is normal in structure. No evidence of mitral valve  regurgitation. No evidence of mitral stenosis.   4. The aortic valve is tricuspid. Aortic valve regurgitation is not  visualized. No aortic stenosis is present.   5. The inferior vena cava is normal in size with greater than 50%  respiratory variability, suggesting right atrial pressure of 3 mmHg.   EKG 02/20/2021: Sinus rhythm 59 bpm First degree AV block  Labs:   Lab Results  Component Value Date   WBC 6.9 02/21/2021   HGB 14.8 02/21/2021   HCT 44.1 02/21/2021   MCV 92.8 02/21/2021   PLT 233 02/21/2021    Recent Labs  Lab 02/21/21 0054  NA 136  K 3.7  CL 101  CO2 28  BUN 15  CREATININE 0.96  CALCIUM 9.4  GLUCOSE  111*    Lipid Panel     Component Value Date/Time   CHOL 193 11/04/2019 1546   TRIG 64 11/04/2019 1546   HDL 67 11/04/2019 1546   CHOLHDL 2.9 11/04/2019 1546   LDLCALC 114 (H) 11/04/2019 1546    BNP (last 3 results) Recent Labs    02/20/21 1622  BNP 9.7    HEMOGLOBIN A1C No results found for: HGBA1C, MPG  Cardiac Panel (last 3 results) No results for input(s): CKTOTAL, CKMB, TROPONINI, RELINDX in the last 8760 hours.  No results found for: CKTOTAL, CKMB, CKMBINDEX, TROPONINI   TSH No results for input(s): TSH in the last 8760 hours.  Radiology: CARDIAC  CATHETERIZATION  Result Date: 02/21/2021 Right dominant circulation with no coronary artery disease Normal LVEDP Consider noncardiac etiology for chest pain Elder Negus, MD Pager: [phone] Office: [phone]  ECHOCARDIOGRAM COMPLETE  Result Date: 02/20/2021    ECHOCARDIOGRAM REPORT   Patient Name:   Judy Baker Date of Exam: 02/20/2021 Medical Rec #:  [id number]     Height:       64.0 in Accession #:    [id number]    Weight:       335.0 lb Date of Birth:  09-19-1970     BSA:          2.436 m Patient Age:    49 years      BP:           123/74 mmHg Patient Gender: F             HR:           59 bpm. Exam Location:  Inpatient Procedure: 2D Echo Indications:     chest pain  History:         Patient has no prior history of Echocardiogram examinations.                  Risk Factors:Dyslipidemia and Hypertension.  Sonographer:     Delcie Roch RDCS Referring Phys:  [id number] ATFTD TOLIA Diagnosing Phys: Tessa Lerner DO IMPRESSIONS  1. Left ventricular ejection fraction, by estimation, is 50 to 55%. The left ventricle has low normal function. The left ventricle has no regional wall motion abnormalities. There is mild left ventricular hypertrophy. Left ventricular diastolic parameters were normal.  2. Right ventricular systolic function is normal. The right ventricular size is normal.  3. The mitral valve is normal in structure. No evidence of mitral valve regurgitation. No evidence of mitral stenosis.  4. The aortic valve is tricuspid. Aortic valve regurgitation is not visualized. No aortic stenosis is present.  5. The inferior vena cava is normal in size with greater than 50% respiratory variability, suggesting right atrial pressure of 3 mmHg. Comparison(s): Compared to prior study 12/2018 (outside records): mild/moderate MR is now resolved, mild TR is now trivial, otherwise no significant change. FINDINGS  Left Ventricle: Left ventricular ejection fraction, by estimation, is 50 to 55%. The left  ventricle has low normal function. The left ventricle has no regional wall motion abnormalities. The left ventricular internal cavity size was normal in size. There is mild left ventricular hypertrophy. Left ventricular diastolic parameters were normal. Normal left ventricular filling pressure. Right Ventricle: The right ventricular size is normal. No increase in right ventricular wall thickness. Right ventricular systolic function is normal. Left Atrium: Left atrial size was normal in size. Right Atrium: Right atrial size was normal in size. Pericardium: There is no evidence of pericardial effusion. Mitral Valve: The mitral  valve is normal in structure. No evidence of mitral valve regurgitation. No evidence of mitral valve stenosis. Tricuspid Valve: The tricuspid valve is normal in structure. Tricuspid valve regurgitation is trivial. No evidence of tricuspid stenosis. Aortic Valve: The aortic valve is tricuspid. Aortic valve regurgitation is not visualized. No aortic stenosis is present. Pulmonic Valve: The pulmonic valve was normal in structure. Pulmonic valve regurgitation is not visualized. No evidence of pulmonic stenosis. Aorta: The aortic root and ascending aorta are structurally normal, with no evidence of dilitation. Venous: The inferior vena cava is normal in size with greater than 50% respiratory variability, suggesting right atrial pressure of 3 mmHg. IAS/Shunts: The atrial septum is grossly normal.  LEFT VENTRICLE PLAX 2D LVIDd:         5.00 cm      Diastology LVIDs:         3.60 cm      LV e' medial:    6.64 cm/s LV PW:         1.20 cm      LV E/e' medial:  12.4 LV IVS:        1.10 cm      LV e' lateral:   8.16 cm/s LVOT diam:     2.20 cm      LV E/e' lateral: 10.1 LV SV:         89 LV SV Index:   37 LVOT Area:     3.80 cm  LV Volumes (MOD) LV vol d, MOD A2C: 108.0 ml LV vol s, MOD A2C: 49.5 ml LV SV MOD A2C:     58.5 ml RIGHT VENTRICLE             IVC RV S prime:     13.10 cm/s  IVC diam: 1.20 cm  TAPSE (M-mode): 2.5 cm LEFT ATRIUM             Index       RIGHT ATRIUM           Index LA diam:        3.20 cm 1.31 cm/m  RA Area:     18.20 cm LA Vol (A2C):   61.0 ml 25.04 ml/m RA Volume:   51.70 ml  21.22 ml/m LA Vol (A4C):   61.5 ml 25.25 ml/m LA Biplane Vol: 65.7 ml 26.97 ml/m  AORTIC VALVE LVOT Vmax:   104.00 cm/s LVOT Vmean:  66.300 cm/s LVOT VTI:    0.235 m  AORTA Ao Root diam: 2.90 cm Ao Asc diam:  3.00 cm MITRAL VALVE MV Area (PHT): 2.76 cm    SHUNTS MV Decel Time: 275 msec    Systemic VTI:  0.24 m MV E velocity: 82.50 cm/s  Systemic Diam: 2.20 cm MV A velocity: 90.00 cm/s MV E/A ratio:  0.92 Sunit Tolia DO Electronically signed by Tessa Lerner DO Signature Date/Time: 02/20/2021/11:31:22 PM    Final       FOLLOW UP PLANS AND APPOINTMENTS Discharge Instructions     Diet - low sodium heart healthy   Complete by: As directed    Increase activity slowly   Complete by: As directed       Allergies as of 02/21/2021       Reactions   Molds & Smuts    Grass Pollen([id number] Vern)    [employer] Description: Grass   Penicillins Hives, Swelling   Did it involve swelling of the face/tongue/throat, SOB, or low BP? Yes Did it involve sudden or severe rash/hives, skin  peeling, or any reaction on the inside of your mouth or nose? Yes Did you need to seek medical attention at a hospital or doctor's office? Yes When did it last happen? childhood (Teenage) allergy  If all above answers are "NO", may proceed with cephalosporin use.   Sulfa Antibiotics Swelling        Medication List     STOP taking these medications    aspirin EC 81 MG tablet       TAKE these medications    albuterol 108 (90 Base) MCG/ACT inhaler Commonly known as: VENTOLIN HFA Inhale 2 puffs into the lungs every 6 (six) hours as needed for wheezing or shortness of breath.   albuterol (2.5 MG/3ML) 0.083% nebulizer solution Commonly known as: PROVENTIL Inhale 2.5 mg into the lungs 3 (three) times daily as  needed for wheezing or shortness of breath.   amLODipine 10 MG tablet Commonly known as: NORVASC Take 1 tablet (10 mg total) by mouth daily. What changed: when to take this   azelastine 0.1 % nasal spray Commonly known as: ASTELIN Place 2 sprays into both nostrils 2 (two) times daily as needed (allergies).   budesonide-formoterol 160-4.5 MCG/ACT inhaler Commonly known as: SYMBICORT Inhale 2 puffs into the lungs in the morning and at bedtime.   busPIRone 10 MG tablet Commonly known as: BUSPAR Take 10 mg by mouth 2 (two) times daily.   Centrum Silver 50+Women Tabs Take 1 tablet by mouth daily.   DULoxetine 60 MG capsule Commonly known as: CYMBALTA Take 60 mg by mouth daily.   Fish Oil 1000 MG Caps Take 1,000 mg by mouth 2 (two) times daily.   fluticasone 50 MCG/ACT nasal spray Commonly known as: FLONASE Place 1 spray into both nostrils 2 (two) times daily as needed for allergies.   furosemide 20 MG tablet Commonly known as: LASIX Take 20 mg by mouth daily as needed for fluid.   HYDROcodone-acetaminophen 10-325 MG tablet Commonly known as: NORCO Take 1 tablet by mouth 3 (three) times daily as needed (for pain). For rotary cuff pain   labetalol 100 MG tablet Commonly known as: NORMODYNE TAKE 1 TABLET BY MOUTH 2 TIMES DAILY. HOLD IF TOP BLOOD PRESSURE NUMBER LESS THAN 100 MMHG OR PULSE LESS THAN 60 BPM. What changed: See the new instructions.   levonorgestrel 20 MCG/DAY Iud Commonly known as: MIRENA by Intrauterine route.   montelukast 10 MG tablet Commonly known as: SINGULAIR Take 10 mg by mouth at bedtime.   nitroGLYCERIN 0.4 MG SL tablet Commonly known as: NITROSTAT PLACE 1 TABLET (0.4 MG TOTAL) UNDER THE TONGUE EVERY 5 (FIVE) MINUTES AS NEEDED FOR CHEST PAIN.   omeprazole 40 MG capsule Commonly known as: PRILOSEC Take 40 mg by mouth daily.   pravastatin 40 MG tablet Commonly known as: PRAVACHOL Take 40 mg by mouth daily.   promethazine 25 MG  tablet Commonly known as: PHENERGAN Take 25 mg by mouth every 6 (six) hours as needed for nausea or vomiting.   spironolactone-hydrochlorothiazide 25-25 MG tablet Commonly known as: ALDACTAZIDE Take 1 tablet by mouth daily. Restarted on 01/31/20   tiZANidine 4 MG tablet Commonly known as: ZANAFLEX Take 4 mg by mouth 3 (three) times daily as needed for muscle spasms.   traMADol 50 MG tablet Commonly known as: ULTRAM Take 50 mg by mouth every 12 (twelve) hours as needed (pain).   traZODone 100 MG tablet Commonly known as: DESYREL Take 100 mg by mouth at bedtime.   Vitamin D-3 125 MCG (  5000 UT) Tabs Take 5,000 Units by mouth daily.   zolpidem 10 MG tablet Commonly known as: AMBIEN Take 10 mg by mouth at bedtime as needed for sleep.           Elder Negus, MD Pager: [phone] Office: [phone]

## 2021-02-22 MED FILL — Midazolam HCl Inj 2 MG/2ML (Base Equivalent): INTRAMUSCULAR | Qty: 2 | Status: AC

## 2021-02-22 MED FILL — Fentanyl Citrate Preservative Free (PF) Inj 100 MCG/2ML: INTRAMUSCULAR | Qty: 2 | Status: AC

## 2021-02-27 ENCOUNTER — Ambulatory Visit: Payer: Medicaid Other | Admitting: Cardiology

## 2021-03-08 ENCOUNTER — Ambulatory Visit: Payer: Medicaid Other | Admitting: Cardiology

## 2021-03-08 NOTE — Progress Notes (Deleted)
Judy Baker Date of Birth: 03/19/1971 MRN: [id number] Primary Care Provider:Scheer, Fayrene Fearing, DO Former Cardiology Providers: Altamese Jenner, APRN, FNP-C Primary Cardiologist: Tessa Lerner, DO, [facility] (established care 01/04/2020)  ***  No chief complaint on file.   HPI  Judy Baker is a 50 y.o.  female who presents to the office with a chief complaint of " ***." Patient's past medical history and cardiovascular risk factors include: Hypertension, hyperlipidemia, obesity, obstructive sleep apnea on CPAP, former smoker, obesity due to excess calories.  Patient is being followed by our practice for the management of benign essential hypertension. Refer to the prior office notes with regards to secondary hypertension work-up.    ***  Patient has stopped smoking cigarettes as of 01/27/2021.  She is congratulated on her efforts of now being smoke-free and is encouraged to continue to do so going forward.  ALLERGIES: Allergies  Allergen Reactions   Molds & Smuts    Grass Pollen(K-O-R-T-Swt Vern)     Allscripts Description: Grass   Penicillins Hives and Swelling    Did it involve swelling of the face/tongue/throat, SOB, or low BP? Yes Did it involve sudden or severe rash/hives, skin peeling, or any reaction on the inside of your mouth or nose? Yes Did you need to seek medical attention at a hospital or doctor's office? Yes When did it last happen? childhood (Teenage) allergy  If all above answers are "NO", may proceed with cephalosporin use.    Sulfa Antibiotics Swelling     MEDICATION LIST PRIOR TO VISIT: Current Outpatient Medications on File Prior to Visit  Medication Sig Dispense Refill   albuterol (PROVENTIL) (2.5 MG/3ML) 0.083% nebulizer solution Inhale 2.5 mg into the lungs 3 (three) times daily as needed for wheezing or shortness of breath.      albuterol (VENTOLIN HFA) 108 (90 Base) MCG/ACT inhaler Inhale 2 puffs into the lungs every 6 (six) hours as needed for wheezing or  shortness of breath.     amLODipine (NORVASC) 10 MG tablet Take 1 tablet (10 mg total) by mouth daily. (Patient taking differently: Take 10 mg by mouth every evening.) 30 tablet 2   azelastine (ASTELIN) 0.1 % nasal spray Place 2 sprays into both nostrils 2 (two) times daily as needed (allergies).      budesonide-formoterol (SYMBICORT) 160-4.5 MCG/ACT inhaler Inhale 2 puffs into the lungs in the morning and at bedtime.     busPIRone (BUSPAR) 10 MG tablet Take 10 mg by mouth 2 (two) times daily.     Cholecalciferol (VITAMIN D-3) 125 MCG (5000 UT) TABS Take 5,000 Units by mouth daily.     DULoxetine (CYMBALTA) 60 MG capsule Take 60 mg by mouth daily.     fluticasone (FLONASE) 50 MCG/ACT nasal spray Place 1 spray into both nostrils 2 (two) times daily as needed for allergies.      furosemide (LASIX) 20 MG tablet Take 20 mg by mouth daily as needed for fluid.     HYDROcodone-acetaminophen (NORCO) 10-325 MG tablet Take 1 tablet by mouth 3 (three) times daily as needed (for pain). For rotary cuff pain     labetalol (NORMODYNE) 100 MG tablet TAKE 1 TABLET BY MOUTH 2 TIMES DAILY. HOLD IF TOP BLOOD PRESSURE NUMBER LESS THAN 100 MMHG OR PULSE LESS THAN 60 BPM. (Patient taking differently: Take 100 mg by mouth 2 (two) times daily.) 180 tablet 0   levonorgestrel (MIRENA) 20 MCG/DAY IUD by Intrauterine route.     montelukast (SINGULAIR) 10 MG tablet Take 10 mg by  mouth at bedtime.     Multiple Vitamins-Minerals (CENTRUM SILVER 50+WOMEN) TABS Take 1 tablet by mouth daily.     nitroGLYCERIN (NITROSTAT) 0.4 MG SL tablet PLACE 1 TABLET (0.4 MG TOTAL) UNDER THE TONGUE EVERY 5 (FIVE) MINUTES AS NEEDED FOR CHEST PAIN. 25 tablet 1   Omega-3 Fatty Acids (FISH OIL) 1000 MG CAPS Take 1,000 mg by mouth 2 (two) times daily.      omeprazole (PRILOSEC) 40 MG capsule Take 40 mg by mouth daily.     pravastatin (PRAVACHOL) 40 MG tablet Take 40 mg by mouth daily.     promethazine (PHENERGAN) 25 MG tablet Take 25 mg by mouth every  6 (six) hours as needed for nausea or vomiting.      spironolactone-hydrochlorothiazide (ALDACTAZIDE) 25-25 MG tablet Take 1 tablet by mouth daily. Restarted on 01/31/20     tiZANidine (ZANAFLEX) 4 MG tablet Take 4 mg by mouth 3 (three) times daily as needed for muscle spasms.      traMADol (ULTRAM) 50 MG tablet Take 50 mg by mouth every 12 (twelve) hours as needed (pain).      traZODone (DESYREL) 100 MG tablet Take 100 mg by mouth at bedtime.     zolpidem (AMBIEN) 10 MG tablet Take 10 mg by mouth at bedtime as needed for sleep.     No current facility-administered medications on file prior to visit.    PAST MEDICAL HISTORY: Past Medical History:  Diagnosis Date   Arthritis    Asthma    Carpal tunnel syndrome    GERD (gastroesophageal reflux disease) 02/04/2019   HLD (hyperlipidemia) 02/04/2019   HTN (hypertension) 02/04/2019   Hypertension    Osteoarthritis     PAST SURGICAL HISTORY: Past Surgical History:  Procedure Laterality Date   BREAST BIOPSY Right 12/21/2018   [facility]   BREAST SURGERY     CARPAL TUNNEL RELEASE     CHOLECYSTECTOMY     LEFT HEART CATH AND CORONARY ANGIOGRAPHY N/A 03/30/2019   Procedure: LEFT HEART CATH AND CORONARY ANGIOGRAPHY;  Surgeon: Yates Decamp, MD;  Location: [facility];  Service: Cardiovascular;  Laterality: N/A;   LEFT HEART CATH AND CORONARY ANGIOGRAPHY N/A 02/21/2021   Procedure: LEFT HEART CATH AND CORONARY ANGIOGRAPHY;  Surgeon: Elder Negus, MD;  Location: [facility];  Service: Cardiovascular;  Laterality: N/A;   TUBAL LIGATION      FAMILY HISTORY: The patient's family history includes Aneurysm in her mother; Cardiomyopathy in her brother; Heart murmur in her sister; Stroke in her father; Valvular heart disease in her mother.   SOCIAL HISTORY:  The patient  reports that she has been smoking cigarettes. She has a 5.00 pack-year smoking history. She has never used smokeless tobacco. She reports current alcohol use. She  reports that she does not use drugs.  Review of Systems  Constitutional: Positive for diaphoresis. Negative for chills and fever.  HENT:  Negative for hoarse voice and nosebleeds.   Eyes:  Negative for discharge, double vision and pain.  Cardiovascular:  Positive for chest pain and dyspnea on exertion. Negative for claudication, leg swelling, near-syncope, orthopnea, palpitations, paroxysmal nocturnal dyspnea and syncope.  Respiratory:  Positive for shortness of breath. Negative for hemoptysis.   Musculoskeletal:  Negative for muscle cramps and myalgias.  Gastrointestinal:  Negative for abdominal pain, constipation, diarrhea, hematemesis, hematochezia, melena, nausea and vomiting.  Neurological:  Negative for dizziness and light-headedness.   PHYSICAL EXAM: Vitals with BMI 02/21/2021 02/21/2021 02/21/2021  Height - - -  Weight - - -  BMI - - -  Systolic 128 121 [id number]  Diastolic 61 59 59  Pulse 62 61 60    CONSTITUTIONAL: Appears older than stated age, obese, hemodynamically stable, no acute distress.   SKIN: Skin is warm and dry. No rash noted. No cyanosis. No pallor. No jaundice HEAD: Normocephalic and atraumatic.  EYES: No scleral icterus MOUTH/THROAT: Moist oral membranes.  NECK: No JVD present. No thyromegaly noted. No carotid bruits  LYMPHATIC: No visible cervical adenopathy.  CHEST Normal respiratory effort. No intercostal retractions  LUNGS: Clear to auscultation bilaterally.  No stridor. No wheezes. No rales.  CARDIOVASCULAR: Regular rate and rhythm, positive S1-S2, no murmurs rubs or gallops appreciated ABDOMINAL: Obese, soft, nontender, nondistended, positive bowel sounds in all 4 quadrants.  No apparent ascites.  EXTREMITIES: No peripheral edema  HEMATOLOGIC: No significant bruising NEUROLOGIC: Oriented to person, place, and time. Nonfocal. Normal muscle tone.  PSYCHIATRIC: Normal mood and affect. Normal behavior. Cooperative  CARDIAC DATABASE: EKG: 02/20/2021: Normal  sinus rhythm, 66 bpm, poor R wave progression, without underlying injury pattern.   Echocardiogram: 01/20/2019: LVEF 57%, mild LVH, grade 1 diastolic impairment, mild to moderate MR, mildly dilated left atrium, mild TR.  02/20/2021: LVEF 50-55%, mild LVH, normal diastolic filling pattern, no significant valvular heart disease.  Stress Testing:  Lexiscan Myoview stress test 02/22/2019: Lexiscan stress test was performed. Stress EKG is non-diagnostic, as this is pharmacological stress test. Normal myocardial thickening with LVEF 50%. SPECT stress and rest images reveal small size, mild intensity, decreased tracer uptake in apical myocardium. In addition, SPECT rest images reveal decreased tracer uptake of medium intensity in basal anteroseptal, inferoseptal myocardium. These findings are most likely due to breast attenuation in a morbidly obese patient. Clinical correlation recommended.  Intermediate risk study.  Heart Catheterization: 02/21/2021: Right dominant circulation with no coronary artery disease Normal LVEDP  30 day event monitor 01/19-02/18/2021: Normal sinus rhythm. Average HR 79 bpm. Max HR 158 bpm, Min HR 50 bpm. Symptoms of flutter/skipped beats, shortness of breath, dizziness correlated with Sinus rhythm with rare PVC. 1 episode of flutter/skipped beats, shortness of breath correlated with Sinus tachycardia at 108 bpm. No A fib or SVT was noted  LABORATORY DATA: CBC Latest Ref Rng & Units 02/21/2021 02/20/2021 11/04/2019  WBC 4.0 - 10.5 K/uL 6.9 6.0 6.4  Hemoglobin 12.0 - 15.0 g/dL 09.614.8 15.3(H) 14.4  Hematocrit 36.0 - 46.0 % 44.1 45.5 42.0  Platelets 150 - 400 K/uL 233 259 294    CMP Latest Ref Rng & Units 02/21/2021 02/20/2021 02/11/2020  Glucose 70 - 99 mg/dL 045(W111(H) 84 93  BUN 6 - 20 mg/dL 15 13 14   Creatinine 0.44 - 1.00 mg/dL 0.980.96 1.190.92 1.470.80  Sodium 135 - 145 mmol/L 136 137 138  Potassium 3.5 - 5.1 mmol/L 3.7 3.3(L) 3.6  Chloride 98 - 111 mmol/L 101 101 98  CO2 22 - 32  mmol/L 28 27 23   Calcium 8.9 - 10.3 mg/dL 9.4 9.6 82.910.0  Total Protein 6.0 - 8.5 g/dL - - -  Total Bilirubin 0.0 - 1.2 mg/dL - - -  Alkaline Phos 48 - 121 IU/L - - -  AST 0 - 40 IU/L - - -  ALT IU/L - - -    Lipid Panel     Component Value Date/Time   CHOL 193 11/04/2019 1546   TRIG 64 11/04/2019 1546   HDL 67 11/04/2019 1546   CHOLHDL 2.9 11/04/2019 1546   LDLCALC 114 (H) 11/04/2019 1546  LABVLDL 12 11/04/2019 1546    No results found for: HGBA1C No components found for: NTPROBNP Lab Results  Component Value Date   TSH 1.140 11/04/2019    Cardiac Panel (last 3 results) No results for input(s): CKTOTAL, CKMB, TROPONINIHS, RELINDX in the last 72 hours.  IMPRESSION:  No diagnosis found.    RECOMMENDATIONS: Alyze Lauf is a 50 y.o. female whose past medical history and cardiovascular risk factors include: Hypertension, hyperlipidemia, obesity, obstructive sleep apnea on CPAP, former smoker, obesity due to excess calories.     FINAL MEDICATION LIST END OF ENCOUNTER: No orders of the defined types were placed in this encounter.    Current Outpatient Medications:    albuterol (PROVENTIL) (2.5 MG/3ML) 0.083% nebulizer solution, Inhale 2.5 mg into the lungs 3 (three) times daily as needed for wheezing or shortness of breath. , Disp: , Rfl:    albuterol (VENTOLIN HFA) 108 (90 Base) MCG/ACT inhaler, Inhale 2 puffs into the lungs every 6 (six) hours as needed for wheezing or shortness of breath., Disp: , Rfl:    amLODipine (NORVASC) 10 MG tablet, Take 1 tablet (10 mg total) by mouth daily. (Patient taking differently: Take 10 mg by mouth every evening.), Disp: 30 tablet, Rfl: 2   azelastine (ASTELIN) 0.1 % nasal spray, Place 2 sprays into both nostrils 2 (two) times daily as needed (allergies). , Disp: , Rfl:    budesonide-formoterol (SYMBICORT) 160-4.5 MCG/ACT inhaler, Inhale 2 puffs into the lungs in the morning and at bedtime., Disp: , Rfl:    busPIRone (BUSPAR) 10 MG  tablet, Take 10 mg by mouth 2 (two) times daily., Disp: , Rfl:    Cholecalciferol (VITAMIN D-3) 125 MCG (5000 UT) TABS, Take 5,000 Units by mouth daily., Disp: , Rfl:    DULoxetine (CYMBALTA) 60 MG capsule, Take 60 mg by mouth daily., Disp: , Rfl:    fluticasone (FLONASE) 50 MCG/ACT nasal spray, Place 1 spray into both nostrils 2 (two) times daily as needed for allergies. , Disp: , Rfl:    furosemide (LASIX) 20 MG tablet, Take 20 mg by mouth daily as needed for fluid., Disp: , Rfl:    HYDROcodone-acetaminophen (NORCO) 10-325 MG tablet, Take 1 tablet by mouth 3 (three) times daily as needed (for pain). For rotary cuff pain, Disp: , Rfl:    labetalol (NORMODYNE) 100 MG tablet, TAKE 1 TABLET BY MOUTH 2 TIMES DAILY. HOLD IF TOP BLOOD PRESSURE NUMBER LESS THAN 100 MMHG OR PULSE LESS THAN 60 BPM. (Patient taking differently: Take 100 mg by mouth 2 (two) times daily.), Disp: 180 tablet, Rfl: 0   levonorgestrel (MIRENA) 20 MCG/DAY IUD, by Intrauterine route., Disp: , Rfl:    montelukast (SINGULAIR) 10 MG tablet, Take 10 mg by mouth at bedtime., Disp: , Rfl:    Multiple Vitamins-Minerals (CENTRUM SILVER 50+WOMEN) TABS, Take 1 tablet by mouth daily., Disp: , Rfl:    nitroGLYCERIN (NITROSTAT) 0.4 MG SL tablet, PLACE 1 TABLET (0.4 MG TOTAL) UNDER THE TONGUE EVERY 5 (FIVE) MINUTES AS NEEDED FOR CHEST PAIN., Disp: 25 tablet, Rfl: 1   Omega-3 Fatty Acids (FISH OIL) 1000 MG CAPS, Take 1,000 mg by mouth 2 (two) times daily. , Disp: , Rfl:    omeprazole (PRILOSEC) 40 MG capsule, Take 40 mg by mouth daily., Disp: , Rfl:    pravastatin (PRAVACHOL) 40 MG tablet, Take 40 mg by mouth daily., Disp: , Rfl:    promethazine (PHENERGAN) 25 MG tablet, Take 25 mg by mouth every 6 (six) hours as  needed for nausea or vomiting. , Disp: , Rfl:    spironolactone-hydrochlorothiazide (ALDACTAZIDE) 25-25 MG tablet, Take 1 tablet by mouth daily. Restarted on 01/31/20, Disp: , Rfl:    tiZANidine (ZANAFLEX) 4 MG tablet, Take 4 mg by mouth 3  (three) times daily as needed for muscle spasms. , Disp: , Rfl:    traMADol (ULTRAM) 50 MG tablet, Take 50 mg by mouth every 12 (twelve) hours as needed (pain). , Disp: , Rfl:    traZODone (DESYREL) 100 MG tablet, Take 100 mg by mouth at bedtime., Disp: , Rfl:    zolpidem (AMBIEN) 10 MG tablet, Take 10 mg by mouth at bedtime as needed for sleep., Disp: , Rfl:   No orders of the defined types were placed in this encounter.  --Continue cardiac medications as reconciled in final medication list. --No follow-ups on file. Or sooner if needed. --Continue follow-up with your primary care physician regarding the management of your other chronic comorbid conditions.  Patient's questions and concerns were addressed to her satisfaction. She voices understanding of the instructions provided during this encounter.   This note was created using a voice recognition software as a result there may be grammatical errors inadvertently enclosed that do not reflect the nature of this encounter. Every attempt is made to correct such errors.  Total time spent: *** minutes.  Tessa Lerner, [place], [facility]  Pager: [phone] Office: [phone]

## 2021-03-13 ENCOUNTER — Ambulatory Visit: Payer: Medicaid Other | Admitting: Cardiology

## 2021-03-19 ENCOUNTER — Ambulatory Visit: Payer: Medicaid Other | Admitting: Cardiology

## 2021-04-05 ENCOUNTER — Other Ambulatory Visit: Payer: Self-pay | Admitting: Cardiology

## 2021-04-05 DIAGNOSIS — I10 Essential (primary) hypertension: Secondary | ICD-10-CM

## 2021-05-11 DIAGNOSIS — M5414 Radiculopathy, thoracic region: Secondary | ICD-10-CM | POA: Insufficient documentation

## 2021-07-25 ENCOUNTER — Other Ambulatory Visit: Payer: Self-pay | Admitting: Cardiology

## 2021-07-25 DIAGNOSIS — I10 Essential (primary) hypertension: Secondary | ICD-10-CM

## 2021-09-26 ENCOUNTER — Ambulatory Visit: Payer: Medicaid Other | Admitting: Cardiology

## 2021-10-03 ENCOUNTER — Other Ambulatory Visit: Payer: Self-pay | Admitting: Cardiology

## 2021-10-03 DIAGNOSIS — I10 Essential (primary) hypertension: Secondary | ICD-10-CM

## 2021-10-15 ENCOUNTER — Ambulatory Visit: Payer: Medicaid Other | Admitting: Cardiology

## 2021-10-29 ENCOUNTER — Ambulatory Visit: Payer: Medicaid Other | Admitting: Cardiology

## 2021-10-29 ENCOUNTER — Encounter: Payer: Self-pay | Admitting: Cardiology

## 2021-10-29 VITALS — BP 117/82 | HR 76 | Temp 98.1°F | Resp 17 | Ht 64.0 in | Wt 354.0 lb

## 2021-10-29 DIAGNOSIS — F17200 Nicotine dependence, unspecified, uncomplicated: Secondary | ICD-10-CM

## 2021-10-29 DIAGNOSIS — I493 Ventricular premature depolarization: Secondary | ICD-10-CM

## 2021-10-29 DIAGNOSIS — G4733 Obstructive sleep apnea (adult) (pediatric): Secondary | ICD-10-CM

## 2021-10-29 DIAGNOSIS — I10 Essential (primary) hypertension: Secondary | ICD-10-CM

## 2021-10-29 DIAGNOSIS — E7800 Pure hypercholesterolemia, unspecified: Secondary | ICD-10-CM

## 2021-10-29 DIAGNOSIS — I1A0 Resistant hypertension: Secondary | ICD-10-CM

## 2021-10-29 DIAGNOSIS — E66813 Obesity, class 3: Secondary | ICD-10-CM

## 2021-10-29 NOTE — Progress Notes (Signed)
? ?Judy Olszewskingela Linsey ?Date of Birth: 1970/10/08 ?MRN: [id number] ?Primary Care Provider:Scheer, Fayrene FearingJames, DO ?Former Cardiology Providers: Altamese CarolinaAshton Kelley, APRN, FNP-C ?Primary Cardiologist: Tessa LernerSunit Aracelly Tencza, DO, [facility] (established care 01/04/2020) ? ?Date: 10/29/21 ?Last Office Visit: 02/20/2021 ? ?Chief Complaint  ?Patient presents with  ? Hypertension  ? Follow-up  ?  68month  ? ? ?HPI  ?Judy Baker is a 51 y.o.  female whose past medical history and cardiovascular risk factors include: Hypertension, hyperlipidemia, obesity, obstructive sleep apnea on CPAP, active smoking, obesity due to excess calories. ? ?Patient was initially referred to the practice for management of resistant hypertension.  Her medications have been uptitrated and her systolic blood pressures are now very well controlled on current medical regimen.  At last office visit her symptoms were very concerning for unstable angina and subsequently had undergone a left heart catheterization which noted normal epicardial coronary arteries.  Clinically she denies any chest pain or heart failure symptoms.  No use of sublingual nitroglycerin tablets since last office encounter. ? ?Unfortunately patient continues to smoke intermittently due to stressful situations.  She will also be starting Ozempic for weight loss as early as tomorrow.  And followed up with PCP and states her hemoglobin A1c is well controlled and she is not a diabetic. ? ?ALLERGIES: ?Allergies  ?Allergen Reactions  ? Molds & Smuts   ? Grass Pollen(K-O-R-T-Swt Vern)   ?  Allscripts Description: Grass  ? Penicillins Hives and Swelling  ?  Did it involve swelling of the face/tongue/throat, SOB, or low BP? Yes ?Did it involve sudden or severe rash/hives, skin peeling, or any reaction on the inside of your mouth or nose? Yes ?Did you need to seek medical attention at a hospital or doctor's office? Yes ?When did it last happen? childhood (Teenage) allergy  ?If all above answers are ?NO?, may proceed with  cephalosporin use. ?  ? Sulfa Antibiotics Swelling  ? ? ? ?MEDICATION LIST PRIOR TO VISIT: ?Current Outpatient Medications on File Prior to Visit  ?Medication Sig Dispense Refill  ? albuterol (PROVENTIL) (2.5 MG/3ML) 0.083% nebulizer solution Inhale 2.5 mg into the lungs 3 (three) times daily as needed for wheezing or shortness of breath.     ? albuterol (VENTOLIN HFA) 108 (90 Base) MCG/ACT inhaler Inhale 2 puffs into the lungs every 6 (six) hours as needed for wheezing or shortness of breath.    ? amLODipine (NORVASC) 10 MG tablet Take 1 tablet (10 mg total) by mouth daily. (Patient taking differently: Take 10 mg by mouth every evening.) 30 tablet 2  ? azelastine (ASTELIN) 0.1 % nasal spray Place 2 sprays into both nostrils 2 (two) times daily as needed (allergies).     ? budesonide-formoterol (SYMBICORT) 160-4.5 MCG/ACT inhaler Inhale 2 puffs into the lungs in the morning and at bedtime.    ? busPIRone (BUSPAR) 10 MG tablet Take 10 mg by mouth 2 (two) times daily.    ? DULoxetine (CYMBALTA) 60 MG capsule Take 60 mg by mouth daily.    ? fluticasone (FLONASE) 50 MCG/ACT nasal spray Place 1 spray into both nostrils 2 (two) times daily as needed for allergies.     ? furosemide (LASIX) 20 MG tablet Take 20 mg by mouth daily as needed for fluid.    ? HYDROcodone-acetaminophen (NORCO) 10-325 MG tablet Take 1 tablet by mouth 3 (three) times daily as needed (for pain). For rotary cuff pain    ? labetalol (NORMODYNE) 100 MG tablet TAKE ONE TABLET BY MOUTH TWICE DAILY. HOLD IF  TOP BLOOD PRESSURE NUMBER IS LESS THAN 100 OR YOUR PULSE LESS THAN 60 180 tablet 0  ? levonorgestrel (MIRENA) 20 MCG/DAY IUD by Intrauterine route.    ? montelukast (SINGULAIR) 10 MG tablet Take 10 mg by mouth at bedtime.    ? Multiple Vitamins-Minerals (CENTRUM SILVER 50+WOMEN) TABS Take 1 tablet by mouth daily.    ? nicotine (NICODERM CQ - DOSED IN MG/24 HR) 7 mg/24hr patch Place 7 mg onto the skin daily.    ? nitroGLYCERIN (NITROSTAT) 0.4 MG SL  tablet PLACE 1 TABLET (0.4 MG TOTAL) UNDER THE TONGUE EVERY 5 (FIVE) MINUTES AS NEEDED FOR CHEST PAIN. 25 tablet 1  ? omeprazole (PRILOSEC) 40 MG capsule Take 40 mg by mouth daily.    ? pravastatin (PRAVACHOL) 40 MG tablet Take 40 mg by mouth daily.    ? promethazine (PHENERGAN) 25 MG tablet Take 25 mg by mouth every 6 (six) hours as needed for nausea or vomiting.     ? Semaglutide,0.25 or 0.5MG /DOS, (OZEMPIC, 0.25 OR 0.5 MG/DOSE,) 2 MG/3ML SOPN Inject into the skin.    ? spironolactone-hydrochlorothiazide (ALDACTAZIDE) 25-25 MG tablet Take 1 tablet by mouth daily. Restarted on 01/31/20    ? tiZANidine (ZANAFLEX) 4 MG tablet Take 4 mg by mouth 3 (three) times daily as needed for muscle spasms.     ? traMADol (ULTRAM) 50 MG tablet Take 50 mg by mouth every 12 (twelve) hours as needed (pain).     ? traZODone (DESYREL) 100 MG tablet Take 100 mg by mouth at bedtime.    ? zolpidem (AMBIEN) 10 MG tablet Take 10 mg by mouth at bedtime as needed for sleep.    ? ?No current facility-administered medications on file prior to visit.  ? ? ?PAST MEDICAL HISTORY: ?Past Medical History:  ?Diagnosis Date  ? Arthritis   ? Asthma   ? Carpal tunnel syndrome   ? GERD (gastroesophageal reflux disease) 02/04/2019  ? HLD (hyperlipidemia) 02/04/2019  ? HTN (hypertension) 02/04/2019  ? Hypertension   ? Osteoarthritis   ? ? ?PAST SURGICAL HISTORY: ?Past Surgical History:  ?Procedure Laterality Date  ? BREAST BIOPSY Right 12/21/2018  ? FIBROADENOMA/PASH  ? BREAST SURGERY    ? CARPAL TUNNEL RELEASE    ? CHOLECYSTECTOMY    ? LEFT HEART CATH AND CORONARY ANGIOGRAPHY N/A 03/30/2019  ? Procedure: LEFT HEART CATH AND CORONARY ANGIOGRAPHY;  Surgeon: Yates Decamp, MD;  [facility]: [facility];  Service: Cardiovascular;  Laterality: N/A;  ? LEFT HEART CATH AND CORONARY ANGIOGRAPHY N/A 02/21/2021  ? Procedure: LEFT HEART CATH AND CORONARY ANGIOGRAPHY;  Surgeon: Elder Negus, MD;  Location: [facility];  Service: Cardiovascular;  Laterality:  N/A;  ? TUBAL LIGATION    ? ? ?FAMILY HISTORY: ?The patient's family history includes Aneurysm in her mother; Cardiomyopathy in her brother; Heart murmur in her sister; Stroke in her father; Valvular heart disease in her mother. ?  ?SOCIAL HISTORY:  ?The patient  reports that she has been smoking cigarettes. She has a 5.00 pack-year smoking history. She has never used smokeless tobacco. She reports current alcohol use. She reports that she does not use drugs. ? ?Review of Systems  ?Cardiovascular:  Negative for chest pain, claudication, dyspnea on exertion, leg swelling, near-syncope, orthopnea, palpitations, paroxysmal nocturnal dyspnea and syncope.  ?Respiratory:  Positive for shortness of breath (better, chronic).   ? ?PHYSICAL EXAM: ? ?  10/29/2021  ?  1:33 PM 02/21/2021  ?  1:00 PM 02/21/2021  ?  11:47 AM  ?Vitals with BMI  ?Height 5\' 4"     ?Weight 354 lbs    ?BMI 60.73    ?Systolic 117 128  ?Diastolic 82 61 59  ?Pulse 76 62 61  ? ? ?CONSTITUTIONAL: Appears older than stated age, obese, hemodynamically stable, no acute distress.   ?SKIN: Skin is warm and dry. No rash noted. No cyanosis. No pallor. No jaundice ?HEAD: Normocephalic and atraumatic.  ?EYES: No scleral icterus ?MOUTH/THROAT: Moist oral membranes.  ?NECK: No JVD present. No thyromegaly noted. No carotid bruits  ?LYMPHATIC: No visible cervical adenopathy.  ?CHEST Normal respiratory effort. No intercostal retractions  ?LUNGS: Clear to auscultation bilaterally.  No stridor. No wheezes. No rales.  ?CARDIOVASCULAR: Regular rate and rhythm, positive S1-S2, no murmurs rubs or gallops appreciated ?ABDOMINAL: Obese, soft, nontender, nondistended, positive bowel sounds in all 4 quadrants.  No apparent ascites.  ?EXTREMITIES: No peripheral edema  ?HEMATOLOGIC: No significant bruising ?NEUROLOGIC: Oriented to person, place, and time. Nonfocal. Normal muscle tone.  ?PSYCHIATRIC: Normal mood and affect. Normal behavior. Cooperative ? ?CARDIAC  DATABASE: ?EKG: ?10/29/2021: Normal sinus rhythm, 79 bpm, consider old anteroseptal infarct, without underlying ischemia or injury pattern. ? ?Echocardiogram: ?02/20/2021: LVEF 50-55%, mild LVH, normal RV size and function, estimated R

## 2021-11-26 ENCOUNTER — Ambulatory Visit (INDEPENDENT_AMBULATORY_CARE_PROVIDER_SITE_OTHER): Payer: Medicaid Other

## 2021-11-26 ENCOUNTER — Encounter (HOSPITAL_COMMUNITY): Payer: Self-pay | Admitting: *Deleted

## 2021-11-26 ENCOUNTER — Ambulatory Visit (HOSPITAL_COMMUNITY)
Admission: EM | Admit: 2021-11-26 | Discharge: 2021-11-26 | Disposition: A | Discharge status: Home / Self Care | Payer: Medicaid Other | Attending: Physician Assistant | Admitting: Physician Assistant

## 2021-11-26 DIAGNOSIS — M62838 Other muscle spasm: Secondary | ICD-10-CM | POA: Diagnosis not present

## 2021-11-26 DIAGNOSIS — W19XXXA Unspecified fall, initial encounter: Secondary | ICD-10-CM | POA: Diagnosis not present

## 2021-11-26 DIAGNOSIS — M25571 Pain in right ankle and joints of right foot: Secondary | ICD-10-CM

## 2021-11-26 DIAGNOSIS — S93401A Sprain of unspecified ligament of right ankle, initial encounter: Secondary | ICD-10-CM | POA: Diagnosis not present

## 2021-11-26 MED ORDER — CYCLOBENZAPRINE HCL 5 MG PO TABS: 5.0000 mg | ORAL_TABLET | Freq: Three times a day (TID) | ORAL | 0 refills | Status: DC | PRN

## 2021-11-26 NOTE — ED Triage Notes (Addendum)
Pt reports slipping and falling 2 evenings ago at grocery store. C/O right ankle pain and discoloration, numbness to right foot and great toe, and generalized discomfort to entire right side of body -- pt states she landed on right side of body. RLE DP pulse 2+; all toes warm with prompt cap refill. Swelling noted to right medial and posterior ankle.

## 2021-11-26 NOTE — ED Provider Notes (Signed)
[place]    CSN: [id number] Arrival date & time: 11/26/21  1552      History   Chief Complaint Chief Complaint  Patient presents with   Fall   Ankle Pain    HPI Judy Baker is a 51 y.o. female.   Pt complains of right ankle pain that started two days ago after she fell in a grocery store.  Reports the floor was wet and she slipped and fell landing on her right side.  Along with right ankle pain she is experiencing right upper back pain and right lower back spasm.  She denies radiation of upper or lower back pain.  She reports right ankle swelling.  She has taken tylenol with minimal relief. No previous right ankle pain.  Pain is worse with weight bearing.   Past Medical History:  Diagnosis Date   Arthritis    Asthma    Carpal tunnel syndrome    GERD (gastroesophageal reflux disease) 02/04/2019   HLD (hyperlipidemia) 02/04/2019   HTN (hypertension) 02/04/2019   Hypertension    Osteoarthritis     Patient Active Problem List   Diagnosis Date Noted   Unstable angina ([place]) 03/30/2019   GERD (gastroesophageal reflux disease) 02/04/2019   HLD (hyperlipidemia) 02/04/2019   HTN (hypertension) 02/04/2019    Past Surgical History:  Procedure Laterality Date   BREAST BIOPSY Right 12/21/2018   [facility]   BREAST SURGERY     CARPAL TUNNEL RELEASE     CHOLECYSTECTOMY     LEFT HEART CATH AND CORONARY ANGIOGRAPHY N/A 03/30/2019   Procedure: LEFT HEART CATH AND CORONARY ANGIOGRAPHY;  Surgeon: Adrian Prows, MD;  Location: [facility];  Service: Cardiovascular;  Laterality: N/A;   LEFT HEART CATH AND CORONARY ANGIOGRAPHY N/A 02/21/2021   Procedure: LEFT HEART CATH AND CORONARY ANGIOGRAPHY;  Surgeon: Nigel Mormon, MD;  Location: [facility];  Service: Cardiovascular;  Laterality: N/A;   TUBAL LIGATION      OB History   No obstetric history on file.      Home Medications    Prior to Admission medications   Medication Sig Start Date End  Date Taking? Authorizing Provider  amLODipine (NORVASC) 10 MG tablet Take 1 tablet (10 mg total) by mouth daily. Patient taking differently: Take 10 mg by mouth every evening. 07/20/19 11/26/21 Yes Miquel Dunn, NP  budesonide-formoterol [facility]) 160-4.5 MCG/ACT inhaler Inhale 2 puffs into the lungs in the morning and at bedtime.   Yes [provider]  busPIRone (BUSPAR) 10 MG tablet Take 10 mg by mouth 2 (two) times daily.   Yes [provider]  cyclobenzaprine (FLEXERIL) 5 MG tablet Take 1 tablet (5 mg total) by mouth 3 (three) times daily as needed for muscle spasms. 11/26/21  Yes Ward, Lenise Arena, PA-C  DULoxetine (CYMBALTA) 60 MG capsule Take 60 mg by mouth daily. 11/24/19  Yes [provider]  fluticasone (FLONASE) 50 MCG/ACT nasal spray Place 1 spray into both nostrils 2 (two) times daily as needed for allergies.  12/21/18  Yes [provider]  furosemide (LASIX) 20 MG tablet Take 20 mg by mouth daily as needed for fluid.   Yes [provider]  HYDROcodone-acetaminophen (NORCO) 10-325 MG tablet Take 1 tablet by mouth 3 (three) times daily as needed (for pain). For rotary cuff pain 01/04/19  Yes [provider]  labetalol (NORMODYNE) 100 MG tablet TAKE ONE TABLET BY MOUTH TWICE DAILY. HOLD IF TOP BLOOD PRESSURE NUMBER IS LESS  THAN 100 OR YOUR PULSE LESS THAN 60 10/04/21  Yes Tolia, Sunit, DO  levonorgestrel (MIRENA) 20 MCG/DAY IUD by Intrauterine route. 05/10/20 05/02/27 Yes [provider]  montelukast (SINGULAIR) 10 MG tablet Take 10 mg by mouth at bedtime.   Yes [provider]  Multiple Vitamins-Minerals (CENTRUM SILVER 50+WOMEN) TABS Take 1 tablet by mouth daily.   Yes [provider]  omeprazole (PRILOSEC) 40 MG capsule Take 40 mg by mouth daily. 12/29/19  Yes [provider]  pravastatin (PRAVACHOL) 40 MG tablet Take 40 mg by mouth daily.   Yes [provider]  Semaglutide,0.25 or  0.5MG /DOS, (OZEMPIC, 0.25 OR 0.5 MG/DOSE,) 2 MG/3ML SOPN Inject into the skin. 10/17/21  Yes [provider]  spironolactone-hydrochlorothiazide (ALDACTAZIDE) 25-25 MG tablet Take 1 tablet by mouth daily. Restarted on 01/31/20 01/24/20  Yes [provider]  traMADol (ULTRAM) 50 MG tablet Take 50 mg by mouth every 12 (twelve) hours as needed (pain).  12/29/18  Yes [provider]  traZODone (DESYREL) 100 MG tablet Take 100 mg by mouth at bedtime. 12/21/18  Yes [provider]  zolpidem (AMBIEN) 10 MG tablet Take 10 mg by mouth at bedtime as needed for sleep. 09/19/20  Yes [provider]  albuterol (PROVENTIL) (2.5 MG/3ML) 0.083% nebulizer solution Inhale 2.5 mg into the lungs 3 (three) times daily as needed for wheezing or shortness of breath.  02/18/19   [provider]  albuterol (VENTOLIN HFA) 108 (90 Base) MCG/ACT inhaler Inhale 2 puffs into the lungs every 6 (six) hours as needed for wheezing or shortness of breath.    [provider]  azelastine (ASTELIN) 0.1 % nasal spray Place 2 sprays into both nostrils 2 (two) times daily as needed (allergies).  12/21/18   [provider]  nicotine (NICODERM CQ - DOSED IN MG/24 HR) 7 mg/24hr patch Place 7 mg onto the skin daily. 10/17/21   [provider]  nitroGLYCERIN (NITROSTAT) 0.4 MG SL tablet PLACE 1 TABLET (0.4 MG TOTAL) UNDER THE TONGUE EVERY 5 (FIVE) MINUTES AS NEEDED FOR CHEST PAIN. 12/11/20 10/29/21  Terri Skains, Sunit, DO  promethazine (PHENERGAN) 25 MG tablet Take 25 mg by mouth every 6 (six) hours as needed for nausea or vomiting.  12/21/18   [provider]    Family History Family History  Problem Relation Age of Onset   Aneurysm Mother    Valvular heart disease Mother    Stroke Father    Heart murmur Sister    Cardiomyopathy Brother     Social History Social History   Tobacco Use   Smoking status: Every Day    Packs/day: 0.25    Years: 20.00    Pack years:  5.00    Types: Cigarettes   Smokeless tobacco: Never   Tobacco comments:    3 cigs a day    Trying to stop smoking  Vaping Use   Vaping Use: Never used  Substance Use Topics   Alcohol use: Yes    Comment: occasionally   Drug use: Never     Allergies   Molds & smuts, Grass pollen([id number]-o-r-t-swt vern), Penicillins, and Sulfa antibiotics   Review of Systems Review of Systems  Constitutional:  Negative for chills and fever.  HENT:  Negative for ear pain and sore throat.   Eyes:  Negative for pain and visual disturbance.  Respiratory:  Negative for cough and shortness of breath.   Cardiovascular:  Negative for chest pain and palpitations.  Gastrointestinal:  Negative for  abdominal pain and vomiting.  Genitourinary:  Negative for dysuria and hematuria.  Musculoskeletal:  Positive for arthralgias (right ankle pain). Negative for back pain.  Skin:  Positive for color change. Negative for rash.  Neurological:  Negative for seizures and syncope.  All other systems reviewed and are negative.   Physical Exam Triage Vital Signs ED Triage Vitals  Enc Vitals Group     BP 11/26/21 1626 (!) 171/73     Pulse Rate 11/26/21 1626 74     Resp 11/26/21 1626 (!) 24     Temp 11/26/21 1626 98.7 F (37.1 C)     Temp Source 11/26/21 1626 Oral     SpO2 11/26/21 1626 95 %     Weight --      Height --      Head Circumference --      Peak Flow --      Pain Score 11/26/21 1627 8     Pain Loc --      Pain Edu? --      Excl. in [place]? --    No data found.  Updated Vital Signs BP (!) 171/73   Pulse 74   Temp 98.7 F (37.1 C) (Oral)   Resp (!) 24   SpO2 95%   Visual Acuity Right Eye Distance:   Left Eye Distance:   Bilateral Distance:    Right Eye Near:   Left Eye Near:    Bilateral Near:     Physical Exam Vitals and nursing note reviewed.  Constitutional:      General: She is not in acute distress.    Appearance: She is well-developed.  HENT:     Head: Normocephalic and  atraumatic.  Eyes:     Conjunctiva/sclera: Conjunctivae normal.  Cardiovascular:     Rate and Rhythm: Normal rate and regular rhythm.     Heart sounds: No murmur heard. Pulmonary:     Effort: Pulmonary effort is normal. No respiratory distress.     Breath sounds: Normal breath sounds.  Abdominal:     Palpations: Abdomen is soft.     Tenderness: There is no abdominal tenderness.  Musculoskeletal:        General: No swelling.     Cervical back: Neck supple.     Right ankle: Swelling present. Tenderness present over the medial malleolus. Normal pulse.     Left ankle: Normal.  Skin:    General: Skin is warm and dry.     Capillary Refill: Capillary refill takes less than 2 seconds.  Neurological:     Mental Status: She is alert.  Psychiatric:        Mood and Affect: Mood normal.     UC Treatments / Results  Labs (all labs ordered are listed, but only abnormal results are displayed) Labs Reviewed - No data to display  EKG   Radiology DG Ankle Complete Right  Result Date: 11/26/2021 CLINICAL DATA:  Fall.  Pain and swelling. EXAM: RIGHT ANKLE - COMPLETE 3+ VIEW COMPARISON:  None Available. FINDINGS: The ankle mortise is symmetric and intact. Moderate lateral malleolar soft tissue swelling. Moderate chronic enthesopathic change at the Achilles insertion on the calcaneus with mild posterior soft tissue swelling. Mild calcific densities overlying the mid to distal Achilles. Moderate plantar calcaneal heel spur. Mild dorsal talonavicular and navicular-cuneiform degenerative osteophytes. No acute fracture is seen. No dislocation. IMPRESSION: 1. No acute fracture. Moderate lateral malleolar soft tissue swelling. 2. Chronic enthesopathic change at the Achilles and plantar fascia insertions  on the calcaneus. Electronically Signed   By: Yvonne Kendall M.D.   On: 11/26/2021 17:15    Procedures Procedures (including critical care time)  Medications Ordered in UC Medications - No data to  display  Initial Impression / Assessment and Plan / UC Course  I have reviewed the triage vital signs and the nursing notes.  Pertinent labs & imaging results that were available during my care of the patient were reviewed by me and considered in my medical decision making (see chart for details).     Right ankle sprain. Xray negative for fracture.  Ankle brace given in clinic today.  Advised supportive care including, rest, elevation, ice, ibuprofen as needed.   Muscle spasm to upper and lower back.  Advised light stretching, ice, and flexeril as needed.   If no improvement or symptoms become worse recommend follow up with ortho.  Final Clinical Impressions(s) / UC Diagnoses   Final diagnoses:  Sprain of right ankle, unspecified ligament, initial encounter  Muscle spasm  Fall, initial encounter     Discharge Instructions      Apply ice to ankle, elevate, wear brace when up and about Recommend Ibuprofen as needed for pain  Can take flexeril as needed for muscle spasm Return if you develop worsening symptoms or follow up with primary care physician    ED Prescriptions     Medication Sig Dispense Auth. Provider   cyclobenzaprine (FLEXERIL) 5 MG tablet Take 1 tablet (5 mg total) by mouth 3 (three) times daily as needed for muscle spasms. 30 tablet Ward, Lenise Arena, PA-C      PDMP not reviewed this encounter.   Ward, Lenise Arena, PA-C 11/26/21 1933

## 2021-11-26 NOTE — Discharge Instructions (Addendum)
Apply ice to ankle, elevate, wear brace when up and about Recommend Ibuprofen as needed for pain  Can take flexeril as needed for muscle spasm Return if you develop worsening symptoms or follow up with primary care physician

## 2022-01-18 ENCOUNTER — Other Ambulatory Visit: Payer: Self-pay | Admitting: Cardiology

## 2022-01-18 DIAGNOSIS — I10 Essential (primary) hypertension: Secondary | ICD-10-CM

## 2022-03-07 ENCOUNTER — Other Ambulatory Visit: Payer: Self-pay | Admitting: Cardiology

## 2022-03-07 DIAGNOSIS — I209 Angina pectoris, unspecified: Secondary | ICD-10-CM

## 2022-03-25 ENCOUNTER — Other Ambulatory Visit: Payer: Self-pay | Admitting: Family Medicine

## 2022-03-25 DIAGNOSIS — N63 Unspecified lump in unspecified breast: Secondary | ICD-10-CM

## 2022-04-03 ENCOUNTER — Other Ambulatory Visit: Payer: Medicaid Other

## 2022-04-12 ENCOUNTER — Other Ambulatory Visit: Payer: Medicaid Other

## 2022-04-16 ENCOUNTER — Other Ambulatory Visit: Payer: Self-pay | Admitting: Family Medicine

## 2022-04-16 DIAGNOSIS — N63 Unspecified lump in unspecified breast: Secondary | ICD-10-CM

## 2022-04-22 ENCOUNTER — Other Ambulatory Visit: Payer: Medicaid Other

## 2022-05-26 ENCOUNTER — Other Ambulatory Visit: Payer: Self-pay | Admitting: Cardiology

## 2022-05-26 DIAGNOSIS — I1A0 Resistant hypertension: Secondary | ICD-10-CM

## 2022-05-27 ENCOUNTER — Other Ambulatory Visit: Payer: Self-pay

## 2022-06-11 ENCOUNTER — Other Ambulatory Visit: Payer: Medicaid Other

## 2022-08-08 ENCOUNTER — Other Ambulatory Visit: Payer: Medicaid Other

## 2022-08-22 IMAGING — DX DG WRIST COMPLETE 3+V*R*
4 series · 5 of 5 positions shown · non-contrast
Comparison: None.

CLINICAL DATA: Right wrist pain after falling this morning.
Constant pain with swelling. Limited ROM. Pt unable to make a fist
or extend hand backwards. Pain more on the ulna styloid/medial
aspect of wrist.

EXAM:
RIGHT WRIST - COMPLETE 3+ VIEW

[Series 1: wrist pa · 2 of 2 slices shown (1 of 2)]
[im 1/2]
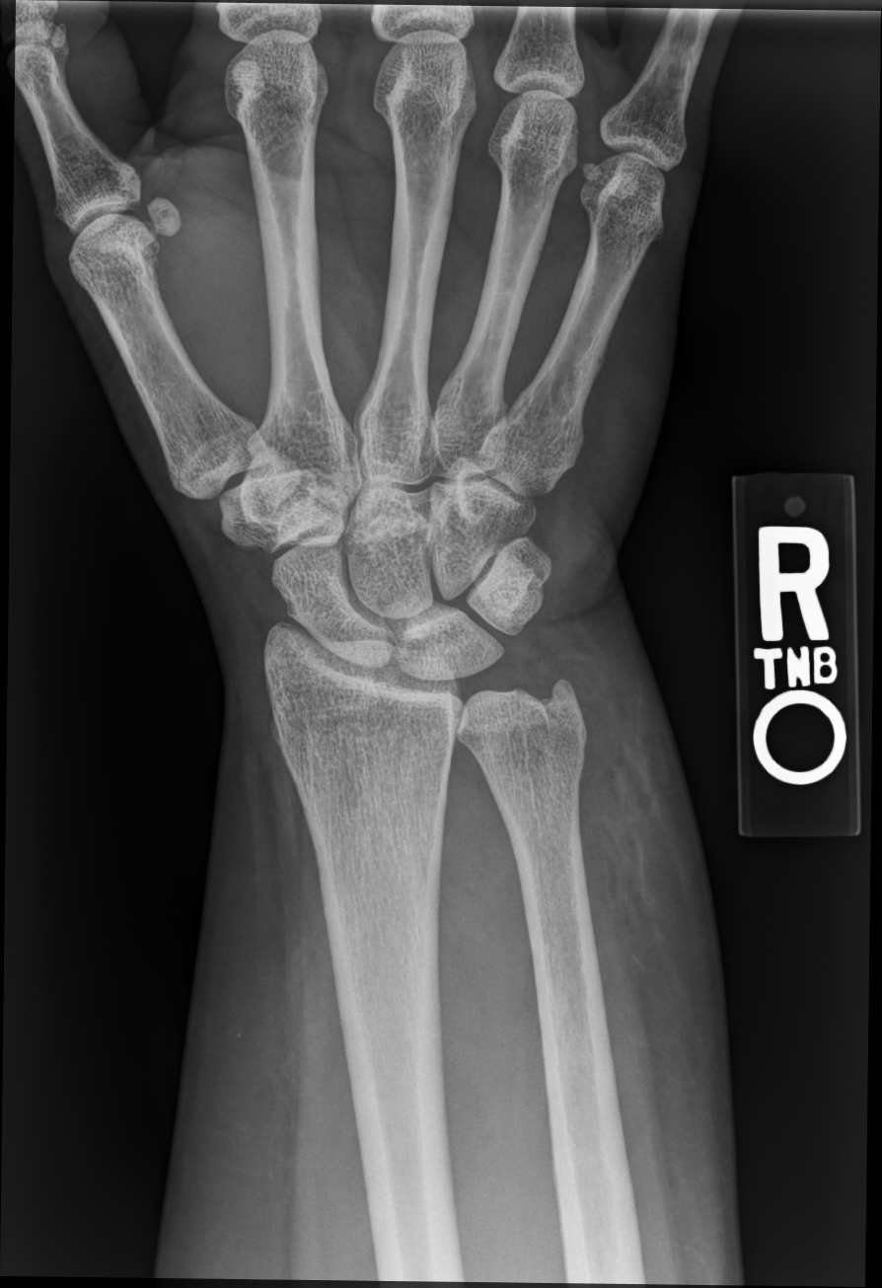
[im 2/2]
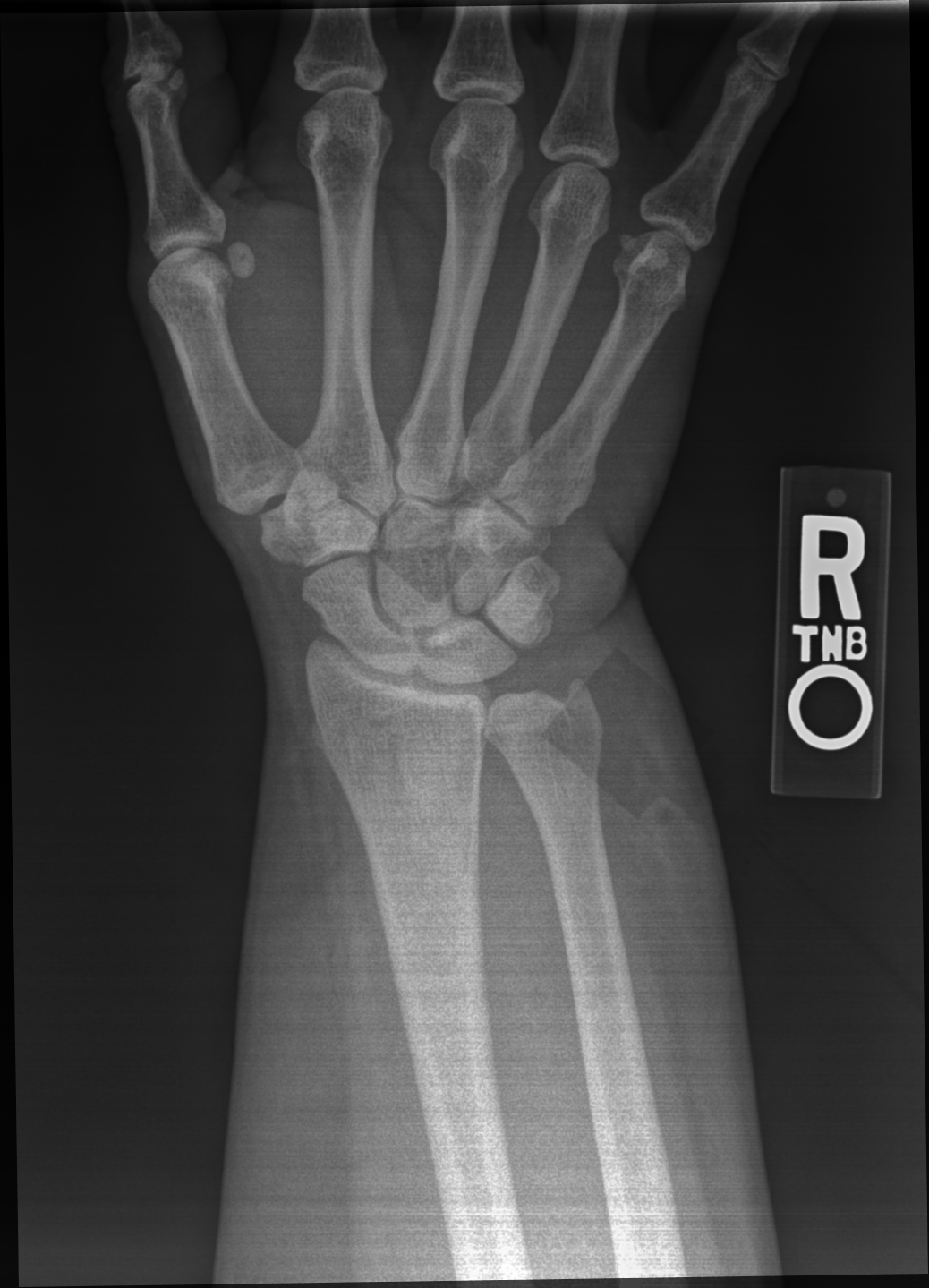

[wrist pa (2 of 2)]
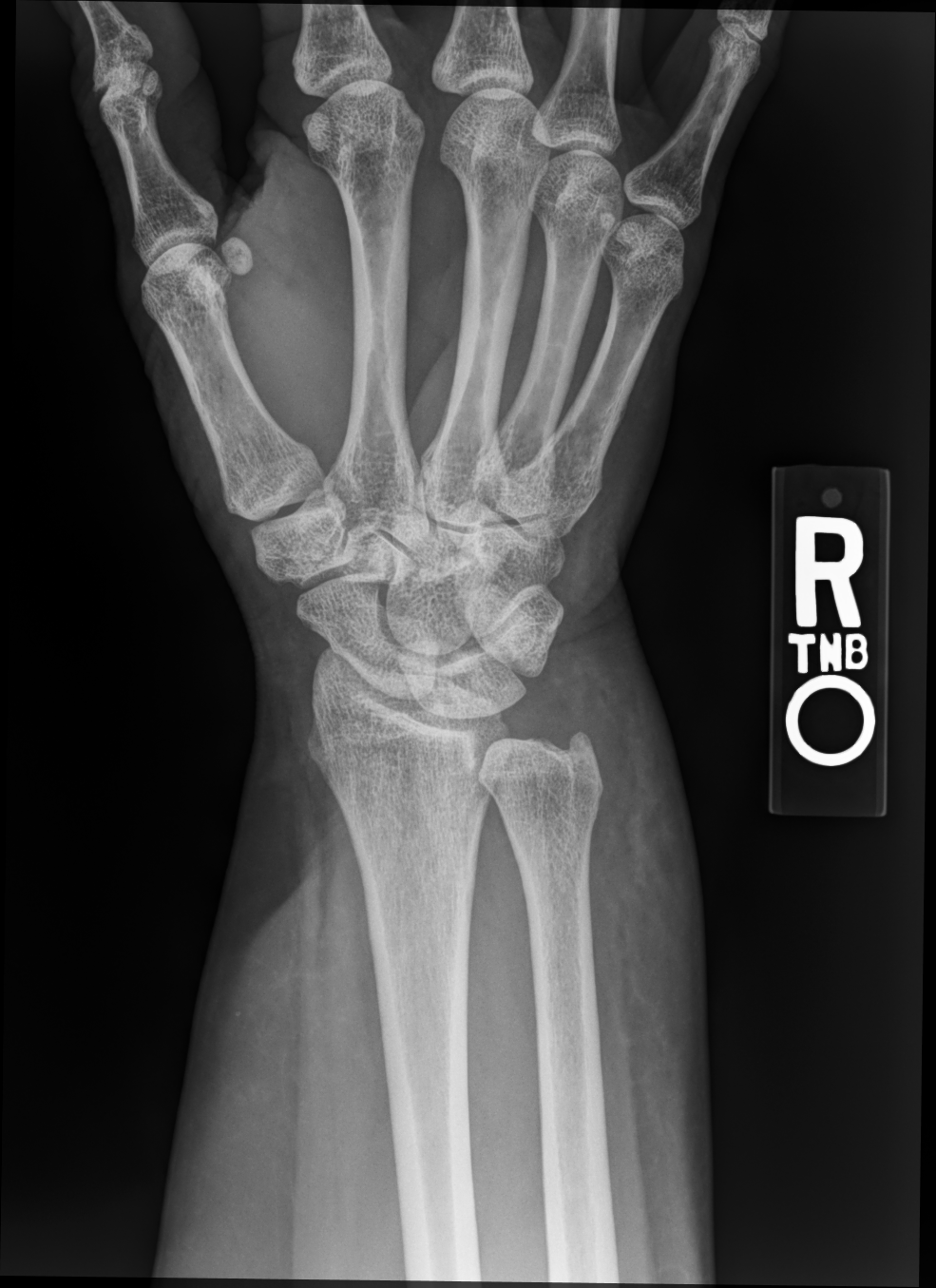

[wrist lat]
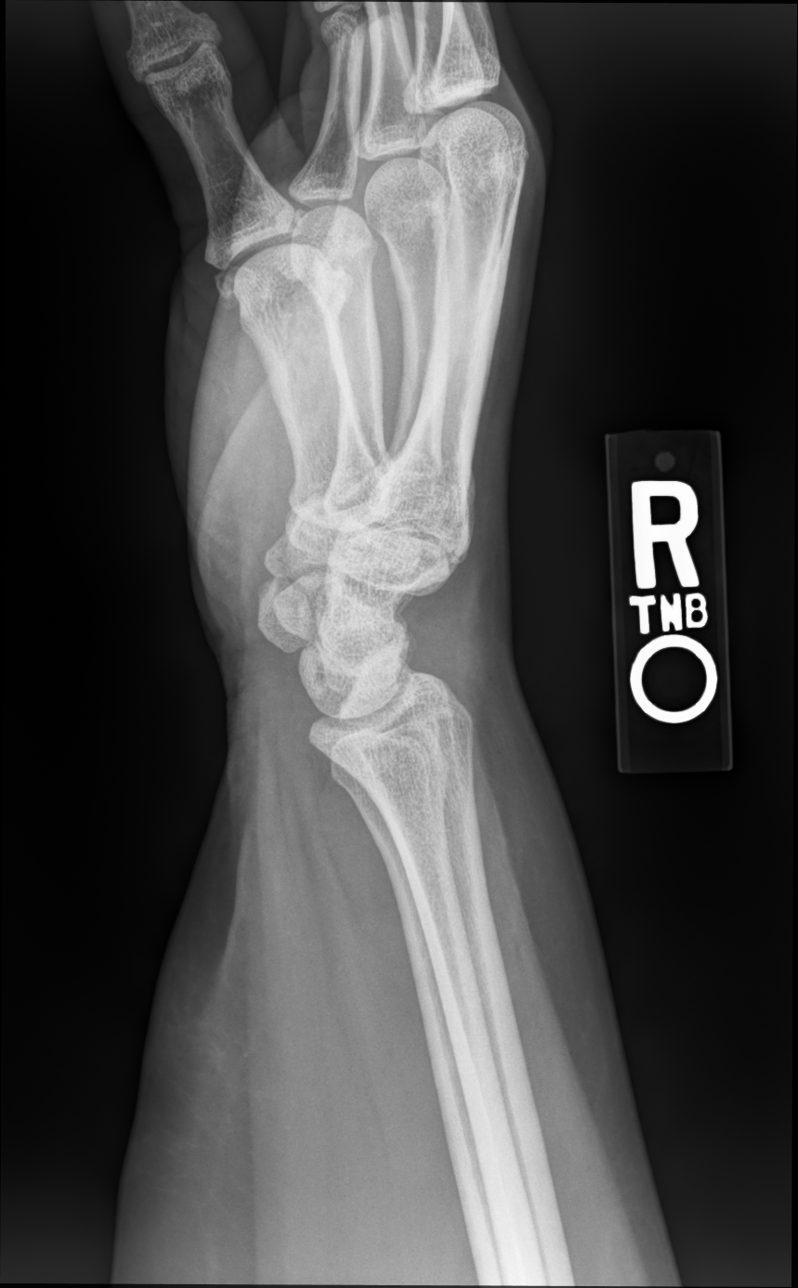

[scaphoid (os scaphoideum i) pa]
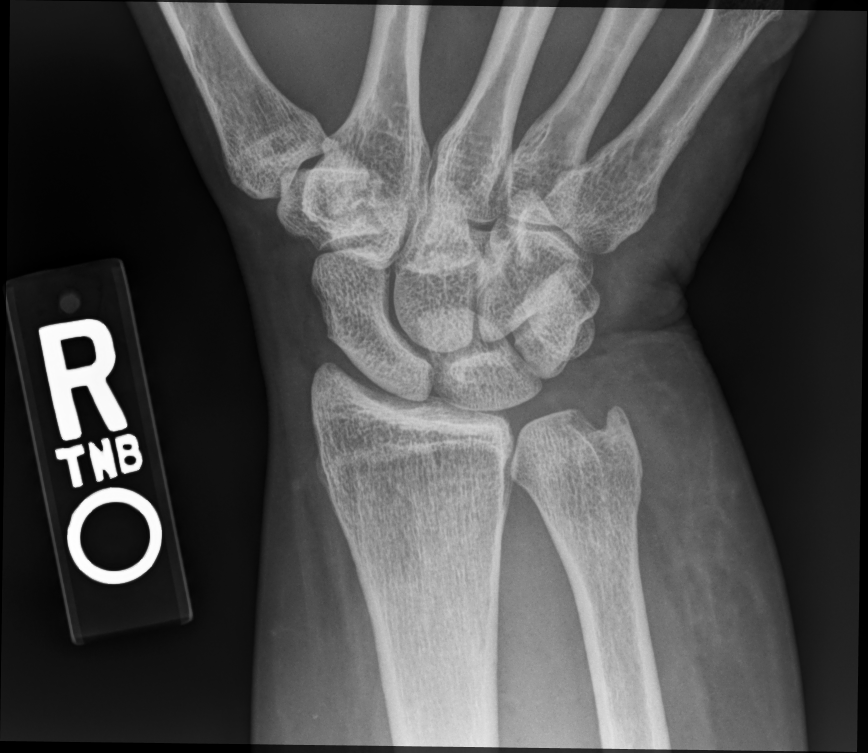

[5 of 5 positions shown; findings below may reference images not displayed]

FINDINGS: There is no evidence of fracture or dislocation. There is no
evidence of arthropathy or other focal bone abnormality. Soft
tissues are unremarkable.
IMPRESSION: Negative.

## 2022-09-27 ENCOUNTER — Inpatient Hospital Stay: Admission: RE | Admit: 2022-09-27 | Payer: Medicaid Other | Source: Ambulatory Visit

## 2022-09-27 ENCOUNTER — Other Ambulatory Visit: Payer: Medicaid Other

## 2022-10-07 ENCOUNTER — Encounter (HOSPITAL_BASED_OUTPATIENT_CLINIC_OR_DEPARTMENT_OTHER): Payer: Self-pay

## 2022-10-07 DIAGNOSIS — G4733 Obstructive sleep apnea (adult) (pediatric): Secondary | ICD-10-CM

## 2022-10-11 ENCOUNTER — Other Ambulatory Visit: Payer: Self-pay

## 2022-10-11 ENCOUNTER — Encounter (HOSPITAL_COMMUNITY): Payer: Self-pay

## 2022-10-11 ENCOUNTER — Emergency Department (HOSPITAL_COMMUNITY)
Admission: EM | Admit: 2022-10-11 | Discharge: 2022-10-12 | Disposition: A | Discharge status: Home / Self Care | Payer: Medicaid Other | Attending: Emergency Medicine | Admitting: Emergency Medicine

## 2022-10-11 DIAGNOSIS — R079 Chest pain, unspecified: Secondary | ICD-10-CM | POA: Diagnosis not present

## 2022-10-11 DIAGNOSIS — Z87891 Personal history of nicotine dependence: Secondary | ICD-10-CM | POA: Insufficient documentation

## 2022-10-11 DIAGNOSIS — Z1152 Encounter for screening for COVID-19: Secondary | ICD-10-CM | POA: Insufficient documentation

## 2022-10-11 DIAGNOSIS — I10 Essential (primary) hypertension: Secondary | ICD-10-CM | POA: Insufficient documentation

## 2022-10-11 DIAGNOSIS — Z79899 Other long term (current) drug therapy: Secondary | ICD-10-CM | POA: Insufficient documentation

## 2022-10-11 LAB — CBC WITH DIFFERENTIAL/PLATELET
Abs Immature Granulocytes: 0.02 10*3/uL (ref 0.00–0.07)
Basophils Absolute: 0 10*3/uL (ref 0.0–0.1)
Basophils Relative: 1 %
Eosinophils Absolute: 0.2 10*3/uL (ref 0.0–0.5)
Eosinophils Relative: 3 %
HCT: 43.7 % (ref 36.0–46.0)
Hemoglobin: 15.3 g/dL — ABNORMAL HIGH (ref 12.0–15.0)
Immature Granulocytes: 0 %
Lymphocytes Relative: 33 %
Lymphs Abs: 1.6 10*3/uL (ref 0.7–4.0)
MCH: 32.3 pg (ref 26.0–34.0)
MCHC: 35 g/dL (ref 30.0–36.0)
MCV: 92.4 fL (ref 80.0–100.0)
Monocytes Absolute: 0.6 10*3/uL (ref 0.1–1.0)
Monocytes Relative: 11 %
Neutro Abs: 2.5 10*3/uL (ref 1.7–7.7)
Neutrophils Relative %: 52 %
Platelets: 302 10*3/uL (ref 150–400)
RBC: 4.73 MIL/uL (ref 3.87–5.11)
RDW: 13.2 % (ref 11.5–15.5)
WBC: 4.9 10*3/uL (ref 4.0–10.5)
nRBC: 0 % (ref 0.0–0.2)

## 2022-10-11 LAB — I-STAT CHEM 8, ED
BUN: 14 mg/dL (ref 6–20)
Calcium, Ion: 1.01 mmol/L — ABNORMAL LOW (ref 1.15–1.40)
Chloride: 99 mmol/L (ref 98–111)
Creatinine, Ser: 0.8 mg/dL (ref 0.44–1.00)
Glucose, Bld: 86 mg/dL (ref 70–99)
HCT: 46 % (ref 36.0–46.0)
Hemoglobin: 15.6 g/dL — ABNORMAL HIGH (ref 12.0–15.0)
Potassium: 4.4 mmol/L (ref 3.5–5.1)
Sodium: 137 mmol/L (ref 135–145)
TCO2: 31 mmol/L (ref 22–32)

## 2022-10-11 LAB — BASIC METABOLIC PANEL
Anion gap: 13 (ref 5–15)
BUN: 12 mg/dL (ref 6–20)
CO2: 26 mmol/L (ref 22–32)
Calcium: 9.4 mg/dL (ref 8.9–10.3)
Chloride: 98 mmol/L (ref 98–111)
Creatinine, Ser: 0.86 mg/dL (ref 0.44–1.00)
GFR, Estimated: >60 mL/min (ref >60)
Glucose, Bld: 87 mg/dL (ref 70–99)
Potassium: 4.3 mmol/L (ref 3.5–5.1)
Sodium: 137 mmol/L (ref 135–145)

## 2022-10-11 LAB — TROPONIN I (HIGH SENSITIVITY): Troponin I (High Sensitivity): 3 ng/L (ref <18)

## 2022-10-11 MED ORDER — ALUM & MAG HYDROXIDE-SIMETH 200-200-20 MG/5ML PO SUSP
30.0000 mL | Freq: Once | ORAL | Status: AC
  Administered 2022-10-11: 30 mL via ORAL
  Filled 2022-10-11: qty 30

## 2022-10-11 MED ORDER — ONDANSETRON HCL 4 MG/2ML IJ SOLN
4.0000 mg | Freq: Once | INTRAMUSCULAR | Status: AC
  Administered 2022-10-11: 4 mg via INTRAVENOUS
  Filled 2022-10-11: qty 2

## 2022-10-11 NOTE — ED Triage Notes (Signed)
Pt BIB [facility] from home c/o intermittent CP that started this morning. Pt took 324 ASA prior to EMS arrival.

## 2022-10-11 NOTE — ED Provider Triage Note (Signed)
Emergency Medicine Provider Triage Evaluation Note  Kelvina Courtenay , a 52 y.o. female  was evaluated in triage.  Pt complains of chest tightness across her chest and down her left arm also behind her shoulder blade, started this morning and has been going on throughout the day.  She does have a history of angina and took some nitroglycerin without relief.  She does have some increased belching and stomach upset as well..  Review of Systems  Positive: Chest pain, belching, shortness of breath, back pain Negative: Fever, cough  Physical Exam  BP 130/79 (BP Location: Right Arm)   Pulse 76   Temp 98.5 F (36.9 C) (Oral)   Resp 20   Ht 5\' 7"  (1.702 m)   Wt (!) 145.2 kg   SpO2 98%   BMI 50.12 kg/m  Gen:   Awake, no distress    Resp:  Normal effort   MSK:   Moves extremities without difficulty   Other:     Medical Decision Making  Medically screening exam initiated at 9:21 PM.  Appropriate orders placed.  Allesha Anastasia was informed that the remainder of the evaluation will be completed by another provider, this initial triage assessment does not replace that evaluation, and the importance of remaining in the ED until their evaluation is complete.  Labs, imaging ordered   Rolan Bucco, MD 10/11/22 2122

## 2022-10-12 ENCOUNTER — Other Ambulatory Visit: Payer: Self-pay | Admitting: Cardiology

## 2022-10-12 ENCOUNTER — Emergency Department (HOSPITAL_COMMUNITY): Payer: Medicaid Other

## 2022-10-12 DIAGNOSIS — I1A0 Resistant hypertension: Secondary | ICD-10-CM

## 2022-10-12 LAB — TROPONIN I (HIGH SENSITIVITY): Troponin I (High Sensitivity): 4 ng/L (ref <18)

## 2022-10-12 LAB — RESP PANEL BY RT-PCR (RSV, FLU A&B, COVID)  RVPGX2
Influenza A by PCR: NEGATIVE
Influenza B by PCR: NEGATIVE
Resp Syncytial Virus by PCR: NEGATIVE
SARS Coronavirus 2 by RT PCR: NEGATIVE

## 2022-10-12 MED ORDER — IOHEXOL 350 MG/ML SOLN
100.0000 mL | Freq: Once | INTRAVENOUS | Status: AC | PRN
  Administered 2022-10-12: 100 mL via INTRAVENOUS

## 2022-10-12 MED ORDER — ACETAMINOPHEN 500 MG PO TABS
1000.0000 mg | ORAL_TABLET | ORAL | Status: AC
  Administered 2022-10-12: 1000 mg via ORAL
  Filled 2022-10-12: qty 2

## 2022-10-12 MED ORDER — ONDANSETRON 4 MG PO TBDP
4.0000 mg | ORAL_TABLET | Freq: Once | ORAL | Status: AC
  Administered 2022-10-12: 4 mg via ORAL
  Filled 2022-10-12: qty 1

## 2022-10-12 MED ORDER — NITROGLYCERIN 0.4 MG SL SUBL
0.4000 mg | SUBLINGUAL_TABLET | Freq: Once | SUBLINGUAL | Status: AC
  Administered 2022-10-12: 0.4 mg via SUBLINGUAL
  Filled 2022-10-12: qty 1

## 2022-10-12 MED ORDER — LIDOCAINE VISCOUS HCL 2 % MT SOLN: 15.0000 mL | Freq: Four times a day (QID) | OROMUCOSAL | 0 refills | Status: DC | PRN

## 2022-10-12 MED ORDER — ACETAMINOPHEN 500 MG PO TABS: 1000.0000 mg | ORAL_TABLET | Freq: Four times a day (QID) | ORAL | 0 refills | Status: AC | PRN

## 2022-10-12 NOTE — ED Provider Notes (Signed)
[place] EMERGENCY DEPARTMENT AT [facility] Provider Note   CSN: [id number] Arrival date & time: 10/11/22  2102     History {Add pertinent medical, surgical, social history, OB history to HPI:1} Chief Complaint  Patient presents with   Chest Pain    Judy Baker is a 52 y.o. female.   Chest Pain Patient is 52 year old female with past medical history significant for HTN, HLD, obesity, OSA, smoking, history of unstable angina  She is present emergency room today with complaints of chest pain that has been present for 23 hours now as it began yesterday at 6 AM.  She states that it is in sternal rating into her left shoulder and back and does seem to have an exertional component.  Nonpleuritic.  No hemoptysis lightheadedness syncope or near syncope.  Denies any leg swelling.  Denies any diaphoresis with states that she has been nauseous persistently since the chest pain began.  She states she took nitroglycerin without significant relief.     Home Medications Prior to Admission medications   Medication Sig Start Date End Date Taking? Authorizing Provider  albuterol (PROVENTIL) (2.5 MG/3ML) 0.083% nebulizer solution Inhale 2.5 mg into the lungs 3 (three) times daily as needed for wheezing or shortness of breath.  02/18/19   [provider]  albuterol (VENTOLIN HFA) 108 (90 Base) MCG/ACT inhaler Inhale 2 puffs into the lungs every 6 (six) hours as needed for wheezing or shortness of breath.    [provider]  amLODipine (NORVASC) 10 MG tablet Take 1 tablet (10 mg total) by mouth daily. Patient taking differently: Take 10 mg by mouth every evening. 07/20/19 11/26/21  Toniann Fail, NP  azelastine (ASTELIN) 0.1 % nasal spray Place 2 sprays into both nostrils 2 (two) times daily as needed (allergies).  12/21/18   [provider]  budesonide-formoterol (SYMBICORT) 160-4.5 MCG/ACT inhaler Inhale 2 puffs into the lungs in the morning and at  bedtime.    [provider]  busPIRone (BUSPAR) 10 MG tablet Take 10 mg by mouth 2 (two) times daily.    [provider]  cyclobenzaprine (FLEXERIL) 5 MG tablet Take 1 tablet (5 mg total) by mouth 3 (three) times daily as needed for muscle spasms. 11/26/21   Ward, Tylene Fantasia, PA-C  DULoxetine (CYMBALTA) 60 MG capsule Take 60 mg by mouth daily. 11/24/19   [provider]  fluticasone (FLONASE) 50 MCG/ACT nasal spray Place 1 spray into both nostrils 2 (two) times daily as needed for allergies.  12/21/18   [provider]  furosemide (LASIX) 20 MG tablet Take 20 mg by mouth daily as needed for fluid.    [provider]  HYDROcodone-acetaminophen (NORCO) 10-325 MG tablet Take 1 tablet by mouth 3 (three) times daily as needed (for pain). For rotary cuff pain 01/04/19   [provider]  labetalol (NORMODYNE) 100 MG tablet TAKE 1 TABLET TWICE A DAY (HOLD IF TOP BLOOD PRESSURE NUMBER IS LESS THAN 100 OR PULSE LESS THAN 60) 05/27/22   Tolia, Sunit, DO  levonorgestrel (MIRENA) 20 MCG/DAY IUD by Intrauterine route. 05/10/20 05/02/27  [provider]  montelukast (SINGULAIR) 10 MG tablet Take 10 mg by mouth at bedtime.    [provider]  Multiple Vitamins-Minerals (CENTRUM SILVER 50+WOMEN) TABS Take 1 tablet by mouth daily.    [provider]  nicotine (NICODERM CQ - DOSED IN MG/24 HR) 7 mg/24hr patch Place 7 mg onto the skin daily. 10/17/21   [provider]  nitroGLYCERIN (NITROSTAT) 0.4 MG SL tablet PLACE 1 TABLET UNDER THE TONGUE EVERY 5 MINUTES AS NEEDED FOR CHEST PAIN. 03/07/22   Tolia, Sunit, DO  omeprazole (PRILOSEC) 40 MG capsule Take 40 mg by mouth daily. 12/29/19   [provider]  pravastatin (PRAVACHOL) 40 MG tablet Take 40 mg by mouth daily.    [provider]  promethazine (PHENERGAN) 25 MG tablet Take 25 mg by mouth every 6 (six) hours as needed for nausea or vomiting.  12/21/18   [provider]  Semaglutide,0.25 or 0.5MG /DOS, (OZEMPIC, 0.25 OR 0.5 MG/DOSE,) 2 MG/3ML SOPN Inject into the skin. 10/17/21   [provider]  spironolactone-hydrochlorothiazide (ALDACTAZIDE) 25-25 MG tablet Take 1 tablet by mouth daily. Restarted on 01/31/20 01/24/20   [provider]  traMADol (ULTRAM) 50 MG tablet Take 50 mg by mouth every 12 (twelve) hours as needed (pain).  12/29/18   [provider]  traZODone (DESYREL) 100 MG tablet Take 100 mg by mouth at bedtime. 12/21/18   [provider]  zolpidem (AMBIEN) 10 MG tablet Take 10 mg by mouth at bedtime as needed for sleep. 09/19/20   [provider]      Allergies    Molds & smuts, Grass pollen([id number]-swt vern), Penicillins, and Sulfa antibiotics    Review of Systems   Review of Systems  Cardiovascular:  Positive for chest pain.    Physical Exam Updated Vital Signs BP 120/61   Pulse 72   Temp 99.2 F (37.3 C)   Resp 18   Ht  (1.702 m)   Wt (!) 145.2 kg   SpO2 97%   BMI 50.12 kg/m  Physical Exam Vitals and nursing note reviewed.  Constitutional:      General: She is not in acute distress.    Appearance: She is obese.  HENT:     Head: Normocephalic and atraumatic.     Nose: Nose normal.  Eyes:     General: No scleral icterus. Cardiovascular:     Rate and Rhythm: Normal rate and regular rhythm.     Pulses: Normal pulses.     Heart sounds: Normal heart sounds.     Comments: Bilateral radial artery pulses 3+ and symmetric Pulmonary:     Effort: Pulmonary effort is normal. No respiratory distress.     Breath sounds: No wheezing.  Abdominal:     Palpations: Abdomen is soft.     Tenderness: There is no abdominal tenderness.  Musculoskeletal:     Cervical back: Normal range of motion.     Right lower leg: No edema.     Left lower leg: No edema.  Skin:    General: Skin is warm and dry.     Capillary Refill: Capillary refill takes less than 2 seconds.  Neurological:     Mental  Status: She is alert. Mental status is at baseline.  Psychiatric:        Mood and Affect: Mood normal.        Behavior: Behavior normal.     ED Results / Procedures / Treatments   Labs (all labs ordered are listed, but only abnormal results are displayed) Labs Reviewed  CBC WITH DIFFERENTIAL/PLATELET - Abnormal; Notable for the following components:      Result Value   Hemoglobin 15.3 (*)    All other components within normal limits  I-STAT CHEM 8, ED - Abnormal; Notable for the following components:   Calcium, Ion 1.01 (*)    Hemoglobin 15.6 (*)  All other components within normal limits  RESP PANEL BY RT-PCR (RSV, FLU A&B, COVID)  [id number]  BASIC METABOLIC PANEL  TROPONIN I (HIGH SENSITIVITY)  TROPONIN I (HIGH SENSITIVITY)    EKG EKG Interpretation  Date/Time:  Friday October 11 2022 21:12:13 EDT Ventricular Rate:  74 PR Interval:  200 QRS Duration: 92 QT Interval:  432 QTC Calculation: 479 R Axis:   52 Text Interpretation: Normal sinus rhythm Normal ECG When compared with ECG of 20-Feb-2021 14:04, PREVIOUS ECG IS PRESENT since last tracing no significant change Confirmed by Rolan Bucco [phone]) on 10/11/2022 9:17:58 PM  Radiology CT Angio Chest/Abd/Pel for Dissection W and/or W/WO  Result Date: 10/12/2022 CLINICAL DATA:  Acute aortic syndrome (AAS) suspected. Pt complains of chest tightness across her chest and down her left arm also behind her shoulder blade, started this morning and has been going on throughout the day. She does have a history of angina and took some nitroglycerin EXAM: CT ANGIOGRAPHY CHEST, ABDOMEN AND PELVIS TECHNIQUE: Non-contrast CT of the chest was initially obtained. Multidetector CT imaging through the chest, abdomen and pelvis was performed using the standard protocol during bolus administration of intravenous contrast. Multiplanar reconstructed images and MIPs were obtained and reviewed to evaluate the vascular anatomy. RADIATION DOSE REDUCTION:  This exam was performed according to the departmental dose-optimization program which includes automated exposure control, adjustment of the mA and/or kV according to patient size and/or use of iterative reconstruction technique. CONTRAST:  OMNIPAQUE IOHEXOL 350 MG/ML SOLN COMPARISON:  None Available. FINDINGS: CTA CHEST FINDINGS Cardiovascular: Preferential opacification of the thoracic aorta. No evidence of thoracic aortic aneurysm or dissection. Normal heart size. No significant pericardial effusion. No atherosclerotic plaque of the thoracic aorta. No coronary artery calcifications. The main pulmonary artery is normal in caliber Mediastinum/Nodes: No enlarged mediastinal, hilar, or axillary lymph nodes. Thyroid gland, trachea, and esophagus demonstrate no significant findings. Lungs/Pleura: No focal consolidation. No pulmonary nodule. No pulmonary mass. No pleural effusion. No pneumothorax. Musculoskeletal: No chest wall abnormality. No suspicious lytic or blastic osseous lesions. No acute displaced fracture. Multilevel degenerative changes of the spine. Review of the MIP images confirms the above findings. CTA ABDOMEN AND PELVIS FINDINGS VASCULAR Aorta: Normal caliber aorta without aneurysm, dissection, vasculitis or significant stenosis. Celiac: Patent without evidence of aneurysm, dissection, vasculitis or significant stenosis. SMA: Patent without evidence of aneurysm, dissection, vasculitis or significant stenosis. Renals: Both renal arteries are patent without evidence of aneurysm, dissection, vasculitis, fibromuscular dysplasia or significant stenosis. IMA: Patent without evidence of aneurysm, dissection, vasculitis or significant stenosis. Inflow: Patent without evidence of aneurysm, dissection, vasculitis or significant stenosis. Veins: No obvious venous abnormality within the limitations of this arterial phase study. Review of the MIP images confirms the above findings. NON-VASCULAR Hepatobiliary:  No focal liver abnormality. No gallstones, gallbladder wall thickening, or pericholecystic fluid. No biliary dilatation. Pancreas: No focal lesion. Normal pancreatic contour. No surrounding inflammatory changes. No main pancreatic ductal dilatation. Spleen: Normal in size without focal abnormality. Splenule is noted. Adrenals/Urinary Tract: No adrenal nodule bilaterally. Bilateral kidneys enhance symmetrically. No hydronephrosis. No hydroureter. 9 mm calcified stone within the left kidney. The urinary bladder is unremarkable. Stomach/Bowel: Stomach is within normal limits. No evidence of bowel wall thickening or dilatation. Colonic diverticulosis. Appendix appears normal. Lymphatic: No lymphadenopathy. Reproductive: Uterus and bilateral adnexa are unremarkable.T-shaped intrauterine device in appropriate position. Bilateral tubal ligation. Other: No intraperitoneal free fluid. No intraperitoneal free gas. No organized fluid collection. Musculoskeletal: No abdominal wall hernia or abnormality. No  suspicious lytic or blastic osseous lesions. No acute displaced fracture. Multilevel mild degenerative changes of the spine. Review of the MIP images confirms the above findings. IMPRESSION: 1. No acute vascular abnormality. 2. No acute intrathoracic abnormality. 3. Nonobstructive 9 mm left nephrolithiasis. 4. Colonic diverticulosis with no acute diverticulitis 5. T-shaped intrauterine device in appropriate position. Electronically Signed   By: Tish Frederickson M.D.   On: 10/12/2022 01:02    Procedures Procedures  {Document cardiac monitor, telemetry assessment procedure when appropriate:1}  Medications Ordered in ED Medications  nitroGLYCERIN (NITROSTAT) SL tablet 0.4 mg (has no administration in time range)  acetaminophen (TYLENOL) tablet 1,000 mg (has no administration in time range)  alum & mag hydroxide-simeth (MAALOX/MYLANTA) 200-200-20 MG/5ML suspension 30 mL (30 mLs Oral Given 10/11/22 2137)  ondansetron  (ZOFRAN) injection 4 mg (4 mg Intravenous Given 10/11/22 2136)  iohexol (OMNIPAQUE) 350 MG/ML injection 100 mL (100 mLs Intravenous Contrast Given 10/12/22 0045)  ondansetron (ZOFRAN-ODT) disintegrating tablet 4 mg (4 mg Oral Given 10/12/22 0502)    ED Course/ Medical Decision Making/ A&P   {   Click here for ABCD2, HEART and other calculatorsREFRESH Note before signing :1}                          Medical Decision Making Risk Prescription drug management.   This patient presents to the ED for concern of ***, this involves a number of treatment options, and is a complaint that carries with it a *** risk of complications and morbidity. A differential diagnosis was considered for the patient's symptoms which is discussed below:   ***   Co morbidities: Discussed in HPI   Brief History:  Patient is 52 year old female with past medical history significant for HTN, HLD, obesity, OSA, smoking, history of unstable angina  She is present emergency room today with complaints of chest pain that has been present for 23 hours now as it began yesterday at 6 AM.  She states that it is in sternal rating into her left shoulder and back and does seem to have an exertional component.  Nonpleuritic.  No hemoptysis lightheadedness syncope or near syncope.  Denies any leg swelling.  Denies any diaphoresis with states that she has been nauseous persistently since the chest pain began.  She states she took nitroglycerin without significant relief.    EMR reviewed including pt PMHx, past surgical history and past visits to ER.   See HPI for more details   Lab Tests:   {Blank single:19197::"I ordered and independently interpreted labs. Labs notable for","I personally reviewed all laboratory work and imaging. Metabolic panel without any acute abnormality specifically kidney function within normal limits and no significant electrolyte abnormalities. CBC without leukocytosis or significant  anemia."}   Imaging Studies:  {Blank single:19197::"NAD. I personally reviewed all imaging studies and no acute abnormality found. I agree with radiology interpretation.","Abnormal findings. I personally reviewed all imaging studies. Imaging notable for","No imaging studies ordered for this patient"}    Cardiac Monitoring:  {Blank single:19197::"The patient was maintained on a cardiac monitor.  I personally viewed and interpreted the cardiac monitored which showed an underlying rhythm of:","NA"} {Blank single:19197::"EKG non-ischemic","NA"}   Medicines ordered:  I ordered medication including ***  for *** Reevaluation of the patient after these medicines showed that the patient {resolved/improved/worsened:23923::"improved"} I have reviewed the patients home medicines and have made adjustments as needed   Critical Interventions:  ***   Consults/Attending Physician   I requested consultation with  Rozell Searing Custovic of [employer],  and discussed lab and imaging findings as well as pertinent plan - they recommend: Discussed 79-year-old cath report which was showing clean coronaries but concerning symptoms here and numerous risk factors.  She recommends outpatient follow-up.  I think this is very reasonable.   Reevaluation:  After the interventions noted above I re-evaluated patient and found that they have :{resolved/improved/worsened:23923::"improved"}   Social Determinants of Health:  {Blank single:19197::"Given cab voucher","Social work/case management involved","The patient's social determinants of health were a factor in the care of this patient"}    Problem List / ED Course:  ***   Dispostion:  After consideration of the diagnostic results and the patients response to treatment, I feel that the patent would benefit from ***       Final Clinical Impression(s) / ED Diagnoses Final diagnoses:  None    Rx / DC Orders ED Discharge Orders     None

## 2022-10-12 NOTE — Discharge Instructions (Addendum)
Your workup today was overall quite reassuring.  Your CT scan did not show any blood clots or tears in the aorta.  Your cardiac enzyme levels were normal, I discussed with your cardiologist's group and they will make sure that you are seen in follow-up closely.  I recommend following up with your gastroenterologist as well.  Tylenol 1000 mg every 6 hours  I have also written a prescription for a medicine called Magic mouthwash you can gargle and then swallow this and see if it helps with your symptoms as well.

## 2022-10-12 NOTE — ED Notes (Signed)
Ct notified pt has IV established and ready for scan

## 2022-10-24 NOTE — Progress Notes (Signed)
Averages January 2024 data Average Systolic BP Level 150.8 mmHg Lowest Systolic BP Level 123 mmHg Highest Systolic BP Level 185 mmHg  Feb 2024 Systolic Blood Pressure 149.6 (133.0 - 170.0) Diastolic Blood Pressure 92.9 (21.3 - 106.0) Heart Rate  82.1 (69.0 - 94.0)  Mar 2024 Systolic Blood Pressure 147.5 (121.0 - 174.0) Diastolic Blood Pressure 91.7 (08.6 - 106.0) Heart Rate  79.9 (69.0 - 89.0)  April 2024  SBP 140 (102 - 170) DBP 88 (56 - 105) HR 80 (65 - 121)  Date Systolic Diastolic Units Heart Rate Units 08/03/22 4:20 PM 136 90 mmHg 90 bpm 08/06/22 10:39 AM 147 90 mmHg 77 bpm 08/08/22 3:35 AM 133 87 mmHg 87 bpm 08/08/22 1:24 PM 137 85 mmHg 94 bpm 08/12/22 6:35 PM 157 100 mmHg 72 bpm 08/13/22 12:25 PM 160 82 mmHg 69 bpm 08/15/22 1:25 PM 167 97 mmHg 89 bpm 08/15/22 6:43 PM 165 106 mmHg 78 bpm 08/17/22 2:08 AM 141 91 mmHg 85 bpm 08/18/22 12:13 AM 145 103 mmHg 77 bpm 08/19/22 1:54 PM 133 87 mmHg 82 bpm 08/20/22 1:43 PM 155 91 mmHg 83 bpm 08/21/22 2:25 PM 170 96 mmHg 92 bpm 08/21/22 6:01 PM 145 99 mmHg 89 bpm 08/23/22 2:32 PM 152 92 mmHg 75 bpm 08/26/22 1:11 PM 151 90 mmHg 74 bpm 08/27/22 11:31 AM 159 102 mmHg 79 bpm 08/28/22 5:18 PM 128 71 mmHg 72 bpm 08/29/22 10:34 AM 154 100 mmHg 81 bpm 08/30/22 12:12 PM 170 88 mmHg 77 bpm 08/31/22 11:10 PM 157 104 mmHg 74 bpm 09/01/22 1:28 PM 127 96 mmHg 78 bpm 09/02/22 11:22 PM 158 91 mmHg 87 bpm 09/03/22 3:33 PM 152 90 mmHg 83 bpm 09/05/22 10:03 AM 138 90 mmHg 85 bpm 09/09/22 11:40 AM 121 78 mmHg 75 bpm 09/10/22 2:22 PM 143 89 mmHg 73 bpm 09/10/22 11:31 PM 140 86 mmHg 69 bpm 09/12/22 10:30 AM 139 77 mmHg 76 bpm 09/13/22 2:25 PM 143 97 mmHg 89 bpm 09/15/22 3:57 PM 133 93 mmHg 76 bpm 09/17/22 3:29 PM 174 95 mmHg 70 bpm 09/20/22 1:12 PM 148 94 mmHg 80 bpm 09/23/22 10:31 PM 155 92 mmHg 87 bpm 09/25/22 5:16 PM 128 90 mmHg 73 bpm 09/26/22 3:05 PM 159 95 mmHg 76 bpm 09/27/22 1:11 AM 148 93 mmHg 77 bpm 09/28/22 9:44 AM 140 80 mmHg 76 bpm 09/28/22 7:01  PM 163 100 mmHg 74 bpm 09/28/22 7:36 PM 134 95 mmHg 76 bpm 09/29/22 4:48 PM 170 86 mmHg 76 bpm 09/29/22 9:07 PM 151 89 mmHg 75 bpm 09/29/22 9:38 PM 145 80 mmHg 81 bpm 09/30/22 3:02 PM 151 84 mmHg 75 bpm 09/30/22 5:24 PM 158 89 mmHg 73 bpm 10/03/22 4:03 PM 125 90 mmHg 65 bpm 10/06/22 5:19 PM 124 84 mmHg 74 bpm 10/09/22 1:10 PM 156 94 mmHg 68 bpm 10/10/22 3:17 PM 150 88 mmHg 68 bpm 10/11/22 6:22 PM 144 97 mmHg 78 bpm 10/14/22 8:33 PM 123 81 mmHg 76 bpm 10/15/22 10:03 PM 154 105 mmHg 66 bpm 10/16/22 8:44 AM 118 76 mmHg 77 bpm 10/17/22 3:25 AM 114 76 mmHg 112 bpm 10/17/22 3:26 AM 102 75 mmHg 111 bpm 10/17/22 3:15 PM 150 90 mmHg 83 bpm 10/18/22 7:02 AM 129 90 mmHg 76 bpm 10/18/22 9:11 PM 148 92 mmHg 79 bpm 10/19/22 11:43 AM 136 94 mmHg 121 bpm 10/20/22 9:05 PM 152 92 mmHg 82 bpm 10/22/22 5:28 PM 154 87 mmHg 76 bpm 10/23/22 7:04 PM 148 103 mmHg 74 bpm 10/24/22 12:25 AM 104 56 mmHg  88 bpm 10/24/22 12:27 AM 119 70 mmHg 88 bpm

## 2022-10-28 ENCOUNTER — Other Ambulatory Visit: Payer: Medicaid Other

## 2022-10-30 ENCOUNTER — Ambulatory Visit: Payer: Medicaid Other | Admitting: Cardiology

## 2022-11-01 ENCOUNTER — Ambulatory Visit: Payer: Self-pay | Admitting: Cardiology

## 2022-12-06 ENCOUNTER — Other Ambulatory Visit: Payer: Self-pay | Admitting: Family Medicine

## 2022-12-06 DIAGNOSIS — Z1231 Encounter for screening mammogram for malignant neoplasm of breast: Secondary | ICD-10-CM

## 2022-12-10 ENCOUNTER — Ambulatory Visit (HOSPITAL_BASED_OUTPATIENT_CLINIC_OR_DEPARTMENT_OTHER): Payer: Medicaid Other | Attending: Family Medicine | Admitting: Internal Medicine

## 2022-12-10 VITALS — Ht 67.0 in | Wt 320.0 lb

## 2022-12-10 DIAGNOSIS — I493 Ventricular premature depolarization: Secondary | ICD-10-CM | POA: Insufficient documentation

## 2022-12-10 DIAGNOSIS — G4733 Obstructive sleep apnea (adult) (pediatric): Secondary | ICD-10-CM | POA: Diagnosis not present

## 2022-12-15 DIAGNOSIS — G4733 Obstructive sleep apnea (adult) (pediatric): Secondary | ICD-10-CM

## 2022-12-15 NOTE — Procedures (Signed)
   Patient Name: Judy Baker, Judy Baker Date: 12/10/2022 Gender: Female D.O.B: [birth year] Age (years): 51 Referring Provider: Coralee Rud DO Height (inches): 67 Interpreting Physician: Jetty Duhamel MD, ABSM Weight (lbs): 320 RPSGT: Shelah Lewandowsky BMI: 50 MRN: [id number] Neck Size: 15.50  CLINICAL INFORMATION The patient is referred for a CPAP titration to treat sleep apnea.  Date of NPSG, Split Night or HST:  NPSG 06/02/12 AHI 6.6/ hr, desaturation to 77%, body weight 390 lbs  SLEEP STUDY TECHNIQUE As per the AASM Manual for the Scoring of Sleep and Associated Events v2.3 (April 2016) with a hypopnea requiring 4% desaturations.  The channels recorded and monitored were frontal, central and occipital EEG, electrooculogram (EOG), submentalis EMG (chin), nasal and oral airflow, thoracic and abdominal wall motion, anterior tibialis EMG, snore microphone, electrocardiogram, and pulse oximetry. Continuous positive airway pressure (CPAP) was initiated at the beginning of the study and titrated to treat sleep-disordered breathing.  MEDICATIONS Medications self-administered by patient taken the night of the study : none reported  TECHNICIAN COMMENTS Comments added by technician: Patient had difficulty initiating sleep. Comments added by scorer: N/A RESPIRATORY PARAMETERS Optimal PAP Pressure (cm): 7 AHI at Optimal Pressure (/hr): 0 Overall Minimal O2 (%): 87.0 Supine % at Optimal Pressure (%): 100 Minimal O2 at Optimal Pressure (%): 87.0   SLEEP ARCHITECTURE The study was initiated at 11:05:01 PM and ended at 5:03:32 AM.  Sleep onset time was 172.1 minutes and the sleep efficiency was 43.2%. The total sleep time was 155 minutes.  The patient spent 10.6% of the night in stage N1 sleep, 74.2% in stage N2 sleep, 0.0% in stage N3 and 15.2% in REM.Stage REM latency was 124.0 minutes  Wake after sleep onset was 31.4. Alpha intrusion was absent. Supine sleep was 100.00%.  CARDIAC  DATA The 2 lead EKG demonstrated sinus rhythm. The mean heart rate was 66.6 beats per minute. Other EKG findings include: PVCs.  LEG MOVEMENT DATA The total Periodic Limb Movements of Sleep (PLMS) were 0. The PLMS index was 0.0. A PLMS index of <15 is considered normal in adults.  IMPRESSIONS - The optimal PAP pressure was 7 cm of water. - Mild oxygen desaturations were observed during this titration (min O2 = 87.0%). On CPAP 7, minimum O2 saturation 87%, Mean 91%. - No snoring was audible during this study. - 2-lead EKG demonstrated: PVCs - Clinically significant periodic limb movements were not noted during this study. Arousals associated with PLMs were rare. - Patient had significant difficulty initiating sleep with sleep onseet about 02:00 AM.  DIAGNOSIS - Obstructive Sleep Apnea (G47.33)  RECOMMENDATIONS - Trial of CPAP therapy on 7 cm H2O or autopap 5-15. - Patient used a Large size Philips Respironics Nasal [place] mask and heated humidification. - Consider if intervention for insomnia maay also be helpful. - Sleep hygiene should be reviewed to assess factors that may improve sleep quality. - Weight management and regular exercise should be initiated or continued.  [Electronically signed] 12/15/2022 12:34 PM  Jetty Duhamel MD, ABSM Diplomate, American Board of Sleep Medicine NPI: [id number]                          Jetty Duhamel Diplomate, American Board of Sleep Medicine  ELECTRONICALLY SIGNED ON:  12/15/2022, 12:30 PM [facility] PH: [phone]   FX: [phone] ACCREDITED BY THE AMERICAN ACADEMY OF SLEEP MEDICINE

## 2022-12-17 ENCOUNTER — Other Ambulatory Visit: Payer: Medicaid Other

## 2023-02-12 ENCOUNTER — Ambulatory Visit
Admission: RE | Admit: 2023-02-12 | Discharge: 2023-02-12 | Disposition: A | Discharge status: Home / Self Care | Payer: Medicaid Other | Source: Ambulatory Visit | Attending: Family Medicine | Admitting: Family Medicine

## 2023-02-12 DIAGNOSIS — Z1231 Encounter for screening mammogram for malignant neoplasm of breast: Secondary | ICD-10-CM

## 2023-02-13 ENCOUNTER — Other Ambulatory Visit: Payer: Self-pay

## 2023-02-13 DIAGNOSIS — N63 Unspecified lump in unspecified breast: Secondary | ICD-10-CM

## 2023-03-04 ENCOUNTER — Ambulatory Visit: Payer: Medicaid Other | Admitting: Physical Therapy

## 2023-03-07 ENCOUNTER — Other Ambulatory Visit: Payer: Medicaid Other

## 2023-03-10 ENCOUNTER — Ambulatory Visit: Payer: Medicaid Other | Admitting: Family Medicine

## 2023-03-13 ENCOUNTER — Encounter: Payer: Self-pay | Admitting: Family Medicine

## 2023-03-13 ENCOUNTER — Ambulatory Visit (INDEPENDENT_AMBULATORY_CARE_PROVIDER_SITE_OTHER): Payer: Medicaid Other | Admitting: Family Medicine

## 2023-03-13 VITALS — BP 124/75 | HR 82 | Temp 98.1°F | Resp 18 | Ht 67.0 in | Wt 342.9 lb

## 2023-03-13 DIAGNOSIS — K219 Gastro-esophageal reflux disease without esophagitis: Secondary | ICD-10-CM

## 2023-03-13 DIAGNOSIS — Z7689 Persons encountering health services in other specified circumstances: Secondary | ICD-10-CM

## 2023-03-13 DIAGNOSIS — F17210 Nicotine dependence, cigarettes, uncomplicated: Secondary | ICD-10-CM | POA: Diagnosis not present

## 2023-03-13 DIAGNOSIS — I10 Essential (primary) hypertension: Secondary | ICD-10-CM | POA: Diagnosis not present

## 2023-03-13 DIAGNOSIS — F418 Other specified anxiety disorders: Secondary | ICD-10-CM

## 2023-03-13 DIAGNOSIS — G894 Chronic pain syndrome: Secondary | ICD-10-CM

## 2023-03-13 MED ORDER — OMEPRAZOLE 40 MG PO CPDR
40.0000 mg | DELAYED_RELEASE_CAPSULE | Freq: Every day | ORAL | 3 refills | Status: AC
Start: 2023-03-13 — End: ?

## 2023-03-13 MED ORDER — BUSPIRONE HCL 10 MG PO TABS: 10.0000 mg | ORAL_TABLET | Freq: Two times a day (BID) | ORAL | 3 refills | Status: DC

## 2023-03-13 MED ORDER — DULOXETINE HCL 60 MG PO CPEP
60.0000 mg | ORAL_CAPSULE | Freq: Every day | ORAL | 3 refills | Status: DC
Start: 2023-03-13 — End: 2023-04-22

## 2023-03-13 MED ORDER — TRAZODONE HCL 100 MG PO TABS: 100.0000 mg | ORAL_TABLET | Freq: Every day | ORAL | 1 refills | Status: DC

## 2023-03-13 MED ORDER — TRAMADOL HCL 50 MG PO TABS
50.0000 mg | ORAL_TABLET | Freq: Three times a day (TID) | ORAL | 0 refills | Status: AC | PRN
Start: 2023-03-13 — End: 2023-03-20

## 2023-03-13 MED ORDER — NICOTINE 7 MG/24HR TD PT24
7.0000 mg | MEDICATED_PATCH | Freq: Every day | TRANSDERMAL | 3 refills | Status: AC
Start: 2023-03-13 — End: ?

## 2023-03-13 NOTE — Progress Notes (Signed)
New Patient Office Visit  Subjective    Patient ID: Judy Baker, female    DOB: [birth year]  Age: 52 y.o. MRN: [id number]  CC:  Chief Complaint  Patient presents with   Establish Care    Patient is here to establish care with new PCP, Previous PCP retired in June     HPI Judy Baker presents to establish care. Pt is new to me. She moved from [place] 7 years ago but was still seeing her PCP.   Pt has asthma and using Albuterol inhaler prn and Symbicort 160mg  BID. She does smoke 1/2 ppd and is trying to stop. She would like to get nicotine patches to help. Pt has hx of depression and anxiety. She is taking Buspar 10mg  BID and Cymbalta 60mg  daily. She needs refills on her mental health medicines and would like referral for counseling. She has Mirena IUD due to heavy menses due out in 2 years.  She has hx of HTN and using Labetalol 100mg  BID and Lasix 20mg  daily prn for swelling. She has Amlodipine 10mg  daily along with Aldactone-hydrochlorothiazide 25/25mg  daily. Labs reviewed from July 2024 and normal.  She takes Omeprazole 40mg  daily for GERD. She needs this refilled today.  She has hx of HLD and is on Pravastatin 40 mg daily. She has hx of OA of bilateral knees and bilateral shoulders. She has been referred to pain management and has appt with them on 9/19. She asks for refills on something for pain today until she sees pain clinic next week. Hard for her to get around due to knee pain.  She declines flu vaccine   Outpatient Encounter Medications as of 03/13/2023  Medication Sig   acetaminophen (TYLENOL) 500 MG tablet Take 2 tablets (1,000 mg total) by mouth every 6 (six) hours as needed.   albuterol (PROVENTIL) (2.5 MG/3ML) 0.083% nebulizer solution Inhale 2.5 mg into the lungs 3 (three) times daily as needed for wheezing or shortness of breath.    albuterol (VENTOLIN HFA) 108 (90 Base) MCG/ACT inhaler Inhale 2 puffs into the lungs every 6 (six) hours as needed for wheezing or  shortness of breath.   azelastine (ASTELIN) 0.1 % nasal spray Place 2 sprays into both nostrils 2 (two) times daily as needed (allergies).    budesonide-formoterol (SYMBICORT) 160-4.5 MCG/ACT inhaler Inhale 2 puffs into the lungs in the morning and at bedtime.   fluticasone (FLONASE) 50 MCG/ACT nasal spray Place 1 spray into both nostrils 2 (two) times daily as needed for allergies.    furosemide (LASIX) 20 MG tablet Take 20 mg by mouth daily as needed for fluid.   labetalol (NORMODYNE) 100 MG tablet TAKE 1 TABLET TWICE A DAY (HOLD IF TOP BLOOD PRESSURE NUMBER IS LESS THAN 100 OR PULSE LESS THAN 60)   levonorgestrel (MIRENA) 20 MCG/DAY IUD by Intrauterine route.   montelukast (SINGULAIR) 10 MG tablet Take 10 mg by mouth at bedtime.   Multiple Vitamins-Minerals (CENTRUM SILVER 50+WOMEN) TABS Take 1 tablet by mouth daily.   nitroGLYCERIN (NITROSTAT) 0.4 MG SL tablet PLACE 1 TABLET UNDER THE TONGUE EVERY 5 MINUTES AS NEEDED FOR CHEST PAIN.   pravastatin (PRAVACHOL) 40 MG tablet Take 40 mg by mouth daily.   spironolactone-hydrochlorothiazide (ALDACTAZIDE) 25-25 MG tablet Take 1 tablet by mouth daily. Restarted on 01/31/20   zolpidem (AMBIEN) 10 MG tablet Take 10 mg by mouth at bedtime as needed for sleep.   [DISCONTINUED] busPIRone (BUSPAR) 10 MG tablet Take 10 mg by mouth 2 (two) times  daily.   [DISCONTINUED] DULoxetine (CYMBALTA) 60 MG capsule Take 60 mg by mouth daily.   [DISCONTINUED] HYDROcodone-acetaminophen (NORCO) 10-325 MG tablet Take 1 tablet by mouth 3 (three) times daily as needed (for pain). For rotary cuff pain   [DISCONTINUED] omeprazole (PRILOSEC) 40 MG capsule Take 40 mg by mouth daily.   [DISCONTINUED] promethazine (PHENERGAN) 25 MG tablet Take 25 mg by mouth every 6 (six) hours as needed for nausea or vomiting.    [DISCONTINUED] traMADol (ULTRAM) 50 MG tablet Take 50 mg by mouth every 12 (twelve) hours as needed (pain).    [DISCONTINUED] traZODone (DESYREL) 100 MG tablet Take 100 mg  by mouth at bedtime.   amLODipine (NORVASC) 10 MG tablet Take 1 tablet (10 mg total) by mouth daily. (Patient taking differently: Take 10 mg by mouth every evening.)   busPIRone (BUSPAR) 10 MG tablet Take 1 tablet (10 mg total) by mouth 2 (two) times daily.   DULoxetine (CYMBALTA) 60 MG capsule Take 1 capsule (60 mg total) by mouth daily.   nicotine (NICODERM CQ - DOSED IN MG/24 HR) 7 mg/24hr patch Place 1 patch (7 mg total) onto the skin daily.   omeprazole (PRILOSEC) 40 MG capsule Take 1 capsule (40 mg total) by mouth daily.   traMADol (ULTRAM) 50 MG tablet Take 1 tablet (50 mg total) by mouth every 8 (eight) hours as needed for up to 7 days (pain).   traZODone (DESYREL) 100 MG tablet Take 1 tablet (100 mg total) by mouth at bedtime.   [DISCONTINUED] cyclobenzaprine (FLEXERIL) 5 MG tablet Take 1 tablet (5 mg total) by mouth 3 (three) times daily as needed for muscle spasms. (Patient not taking: Reported on 03/13/2023)   [DISCONTINUED] magic mouthwash (lidocaine, diphenhydrAMINE, alum & mag hydroxide) suspension Swish and swallow 15 mLs 4 (four) times daily as needed for mouth pain. (Patient not taking: Reported on 03/13/2023)   [DISCONTINUED] nicotine (NICODERM CQ - DOSED IN MG/24 HR) 7 mg/24hr patch Place 7 mg onto the skin daily. (Patient not taking: Reported on 03/13/2023)   [DISCONTINUED] Semaglutide,0.25 or 0.5MG /DOS, (OZEMPIC, 0.25 OR 0.5 MG/DOSE,) 2 MG/3ML SOPN Inject into the skin. (Patient not taking: Reported on 03/13/2023)   No facility-administered encounter medications on file as of 03/13/2023.    Past Medical History:  Diagnosis Date   Arthritis    Asthma    Carpal tunnel syndrome    GERD (gastroesophageal reflux disease) 02/04/2019   HLD (hyperlipidemia) 02/04/2019   HTN (hypertension) 02/04/2019   Hypertension    Osteoarthritis     Past Surgical History:  Procedure Laterality Date   BREAST BIOPSY Right 12/21/2018   [facility]   BREAST SURGERY     CARPAL TUNNEL RELEASE      CHOLECYSTECTOMY     LEFT HEART CATH AND CORONARY ANGIOGRAPHY N/A 03/30/2019   Procedure: LEFT HEART CATH AND CORONARY ANGIOGRAPHY;  Surgeon: Yates Decamp, MD;  Location: [facility] INVASIVE CV [facility];  Service: Cardiovascular;  Laterality: N/A;   LEFT HEART CATH AND CORONARY ANGIOGRAPHY N/A 02/21/2021   Procedure: LEFT HEART CATH AND CORONARY ANGIOGRAPHY;  Surgeon: Elder Negus, MD;  Location: [facility] INVASIVE CV [facility];  Service: Cardiovascular;  Laterality: N/A;   TUBAL LIGATION      Family History  Problem Relation Age of Onset   Aneurysm Mother    Valvular heart disease Mother    Stroke Father    Heart murmur Sister    Cardiomyopathy Brother     Social History   Socioeconomic History   Marital  status: Single    Spouse name: Not on file   Number of children: 2   Years of education: Not on file   Highest education level: Not on file  Occupational History   Not on file  Tobacco Use   Smoking status: Every Day    Current packs/day: 0.25    Average packs/day: 0.3 packs/day for 20.0 years (5.0 ttl pk-yrs)    Types: Cigarettes    Passive exposure: Current   Smokeless tobacco: Never   Tobacco comments:    3 cigs a day    Trying to stop smoking  Vaping Use   Vaping status: Never Used  Substance and Sexual Activity   Alcohol use: Yes    Comment: occasionally   Drug use: Never   Sexual activity: Not Currently  Other Topics Concern   Not on file  Social History Narrative   ** Merged History Encounter **       Social Determinants of Health   Financial Resource Strain: Low Risk  (10/26/2020)   [facility]   Overall Financial Resource Strain (CARDIA)    Difficulty of Paying Living Expenses: Not very hard  Food Insecurity: No Food Insecurity (10/26/2020)   [facility]   Hunger Vital Sign    Worried About Running Out of Food in the Last Year: Never true    Ran Out of Food in the Last Year: Never true  Transportation  Needs: No Transportation Needs (10/26/2020)   [facility]   PRAPARE - Transportation    Lack of Transportation (Medical): No    Lack of Transportation (Non-Medical): No  Physical Activity: Insufficiently Active (10/26/2020)   [facility]   Exercise Vital Sign    Days of Exercise per Week: 2 days    Minutes of Exercise per Session: 20 min  Stress: No Stress Concern Present (10/26/2020)   [facility]   [employer] of Occupational Health - Occupational Stress Questionnaire    Feeling of Stress : Not at all  Social Connections: Unknown (04/15/2022)   [facility]   Social Network    Social Network: Not on file  Intimate Partner Violence: Unknown (04/15/2022)   [facility]   HITS    Physically Hurt: Not on file    Insult or Talk Down To: Not on file    Threaten Physical Harm: Not on file    Scream or Curse: Not on file    Review of Systems  Musculoskeletal:  Positive for joint pain.  Psychiatric/Behavioral:  Positive for depression. The patient is nervous/anxious and has insomnia.   All other systems reviewed and are negative.       Objective    BP 124/75   Pulse 82   Temp 98.1 F (36.7 C) (Oral)   Resp 18   Ht 5\' 7"  (1.702 m)   Wt (!) 342 lb 14.4 oz (155.5 kg)   SpO2 99%   BMI 53.71 kg/m   Physical Exam Vitals and nursing note reviewed.  Constitutional:      Appearance: Normal appearance. She is obese.  HENT:     Head: Normocephalic and atraumatic.     Right Ear: External ear normal.     Left Ear: External ear normal.     Nose: Nose normal.     Mouth/Throat:     Mouth: Mucous membranes are  moist.     Pharynx: Oropharynx is clear.  Eyes:     Conjunctiva/sclera: Conjunctivae normal.     Pupils: Pupils are equal, round, and reactive to light.  Cardiovascular:     Rate and Rhythm: Normal rate and regular  rhythm.     Pulses: Normal pulses.     Heart sounds: Normal heart sounds.  Pulmonary:     Effort: Pulmonary effort is normal.     Breath sounds: Normal breath sounds.  Abdominal:     General: Abdomen is flat. Bowel sounds are normal.  Skin:    General: Skin is warm.     Capillary Refill: Capillary refill takes less than 2 seconds.  Neurological:     General: No focal deficit present.     Mental Status: She is alert and oriented to person, place, and time. Mental status is at baseline.  Psychiatric:        Mood and Affect: Mood normal.        Behavior: Behavior normal.        Thought Content: Thought content normal.        Judgment: Judgment normal.        Assessment & Plan:   Problem List Items Addressed This Visit       Cardiovascular and Mediastinum   HTN (hypertension)     Digestive   GERD (gastroesophageal reflux disease)   Relevant Medications   omeprazole (PRILOSEC) 40 MG capsule   Other Visit Diagnoses     Encounter to establish care with new doctor    -  Primary   Continuous dependence on cigarette smoking       Relevant Medications   nicotine (NICODERM CQ - DOSED IN MG/24 HR) 7 mg/24hr patch   Depression with anxiety       Relevant Medications   busPIRone (BUSPAR) 10 MG tablet   DULoxetine (CYMBALTA) 60 MG capsule   traZODone (DESYREL) 100 MG tablet   Other Relevant Orders   Ambulatory referral to Behavioral Health   Chronic pain syndrome       Relevant Medications   DULoxetine (CYMBALTA) 60 MG capsule   traZODone (DESYREL) 100 MG tablet   traMADol (ULTRAM) 50 MG tablet     Encounter to establish care with new doctor  Continuous dependence on cigarette smoking -     Nicotine; Place 1 patch (7 mg total) onto the skin daily.  Dispense: 28 patch; Refill: 3  Gastroesophageal reflux disease without esophagitis -     Omeprazole; Take 1 capsule (40 mg total) by mouth daily.  Dispense: 30 capsule; Refill: 3  Depression with anxiety -     busPIRone  HCl; Take 1 tablet (10 mg total) by mouth 2 (two) times daily.  Dispense: 60 tablet; Refill: 3 -     DULoxetine HCl; Take 1 capsule (60 mg total) by mouth daily.  Dispense: 30 capsule; Refill: 3 -     Ambulatory referral to Behavioral Health -     traZODone HCl; Take 1 tablet (100 mg total) by mouth at bedtime.  Dispense: 30 tablet; Refill: 1  Chronic pain syndrome -     traMADol HCl; Take 1 tablet (50 mg total) by mouth every 8 (eight) hours as needed for up to 7 days (pain).  Dispense: 21 tablet; Refill: 0  Primary hypertension    Refilled nicotine patches today and counseled on smoking cessation. Refilled Omeprazole 40 mg daily. Refilled Buspar 10mg  BID and Cymbalta 60mg  daily. Referral to [facility] for counseling  Refilled Ultram 50mg  TID prn x 7 days only. Further pain management to come from pain clinic.  Blood pressure at goal and stable.  Return in about 3 months (around 06/12/2023) for Chronic condition follow up.   Suzan Slick, MD

## 2023-03-19 ENCOUNTER — Other Ambulatory Visit: Payer: Medicaid Other

## 2023-03-19 ENCOUNTER — Ambulatory Visit
Admission: RE | Admit: 2023-03-19 | Discharge: 2023-03-19 | Disposition: A | Discharge status: Home / Self Care | Payer: Medicaid Other | Source: Ambulatory Visit

## 2023-03-19 DIAGNOSIS — N63 Unspecified lump in unspecified breast: Secondary | ICD-10-CM

## 2023-03-24 ENCOUNTER — Ambulatory Visit: Payer: Medicaid Other

## 2023-03-25 ENCOUNTER — Encounter: Payer: Medicaid Other | Admitting: Family Medicine

## 2023-03-31 ENCOUNTER — Encounter: Payer: Self-pay | Admitting: Family Medicine

## 2023-03-31 ENCOUNTER — Telehealth (INDEPENDENT_AMBULATORY_CARE_PROVIDER_SITE_OTHER): Payer: Medicaid Other | Admitting: Family Medicine

## 2023-03-31 VITALS — BP 140/71 | Ht 67.0 in

## 2023-03-31 DIAGNOSIS — J4541 Moderate persistent asthma with (acute) exacerbation: Secondary | ICD-10-CM | POA: Diagnosis not present

## 2023-03-31 DIAGNOSIS — F17210 Nicotine dependence, cigarettes, uncomplicated: Secondary | ICD-10-CM

## 2023-03-31 MED ORDER — ALBUTEROL SULFATE (2.5 MG/3ML) 0.083% IN NEBU: 2.5000 mg | INHALATION_SOLUTION | Freq: Four times a day (QID) | RESPIRATORY_TRACT | 1 refills | Status: AC | PRN

## 2023-03-31 MED ORDER — PROMETHAZINE-DM 6.25-15 MG/5ML PO SYRP: 5.0000 mL | ORAL_SOLUTION | Freq: Every evening | ORAL | 0 refills | Status: DC | PRN

## 2023-03-31 MED ORDER — PREDNISONE 10 MG PO TABS
ORAL_TABLET | ORAL | 0 refills | Status: AC
Start: 2023-03-31 — End: 2023-04-08

## 2023-03-31 NOTE — Progress Notes (Signed)
Virtual Visit via Video Note  I connected with Judy Baker on 03/31/23 at  9:10 AM EDT by a video enabled telemedicine application and verified that I am speaking with the correct person using two identifiers.  Location: Patient: Home in [name] Provider: [facility]   I discussed the limitations of evaluation and management by telemedicine and the availability of in person appointments. The patient expressed understanding and agreed to proceed.  History of Present Illness: pt is here for acute visit. She reports she gets this every year, especially this time of year with changing seasons. She has hx of asthma and has increased cough, wheezing and chest tightness. Denies chest pains. No sick contacts. Still eating well and denies fevers or chills. She has been using her Albuterol inhaler along with her Symbicort BID. She has flonase and Singulair as well. She has increased mucus and tried OTC mucinex which hasn't helped her symptoms.   Observations/Objective: Physical Exam Vitals and nursing note reviewed.  Constitutional:      Appearance: Normal appearance. She is obese.  HENT:     Head: Normocephalic and atraumatic.     Right Ear: External ear normal.     Left Ear: External ear normal.     Nose: Nose normal.  Eyes:     Extraocular Movements: Extraocular movements intact.  Pulmonary:     Effort: Pulmonary effort is normal. No respiratory distress.     Comments: Raspy voice Neurological:     General: No focal deficit present.     Mental Status: She is alert and oriented to person, place, and time. Mental status is at baseline.  Psychiatric:        Mood and Affect: Mood normal.        Thought Content: Thought content normal.        Judgment: Judgment normal.     Assessment and Plan: Moderate persistent asthma with exacerbation -     Albuterol Sulfate; Inhale 3 mLs (2.5 mg total) into the lungs every 6 (six) hours as needed for wheezing or  shortness of breath.  Dispense: 360 mL; Refill: 1 -     Promethazine-DM; Take 5 mLs by mouth at bedtime as needed for cough.  Dispense: 118 mL; Refill: 0 -     predniSONE; Take 6 tablets (60 mg total) by mouth daily with breakfast for 2 days, THEN 4 tablets (40 mg total) daily with breakfast for 2 days, THEN 2 tablets (20 mg total) daily with breakfast for 2 days, THEN 1 tablet (10 mg total) daily with breakfast for 2 days.  Dispense: 26 tablet; Refill: 0     Follow Up Instructions: Pt with chronic asthma and now with exacerbation. Tried OTC medicines and not helping. Will treat with Prednisone taper, refill albuterol nebulizer medicine and promethazine cough suppressant. Follow up if condition worsening.   I discussed the assessment and treatment plan with the patient. The patient was provided an opportunity to ask questions and all were answered. The patient agreed with the plan and demonstrated an understanding of the instructions.   The patient was advised to call back or seek an in-person evaluation if the symptoms worsen or if the condition fails to improve as anticipated.  I provided 5 minutes of non-face-to-face time during this encounter.   Judy Slick, MD

## 2023-04-02 ENCOUNTER — Telehealth: Payer: Self-pay | Admitting: Family Medicine

## 2023-04-02 DIAGNOSIS — J4541 Moderate persistent asthma with (acute) exacerbation: Secondary | ICD-10-CM

## 2023-04-02 MED ORDER — BENZONATATE 200 MG PO CAPS
200.0000 mg | ORAL_CAPSULE | Freq: Three times a day (TID) | ORAL | 0 refills | Status: DC | PRN
Start: 2023-04-02 — End: 2023-04-17

## 2023-04-02 NOTE — Telephone Encounter (Signed)
I have sent her in Tessalon perles to use TID prn for cough along with the cough syrup. She should also be doing her inhalers and nebulizers around the clock due to her cough. If symptoms persists or worsens, needs to go to ER for her asthma exacerbation.

## 2023-04-02 NOTE — Telephone Encounter (Signed)
Patient calling back to state that prescription sent for cough syrup is not covered by insurance. Patient requesting alternative that is covered if at all possible, as she is on a monthly check and cannot afford it otherwise. Katha Hamming

## 2023-04-02 NOTE — Telephone Encounter (Signed)
Message sent to provider 

## 2023-04-02 NOTE — Telephone Encounter (Signed)
Patient called and states that the cough syrup that was prescribed for her is not working she states that she is still waking up throughout the night coughing really bad and states that her chest is really sore from coughing so much, She wanted to know if there was something else that she could take that would help her   Please advise

## 2023-04-02 NOTE — Telephone Encounter (Signed)
Pt just seen Dr.Rucker a couple days ago and the medication is not helping at all and not working. Please call patient and triage for medical advice.

## 2023-04-02 NOTE — Addendum Note (Signed)
Addended by: Suzan Slick on: 04/02/2023 03:23 PM   Modules accepted: Orders

## 2023-04-03 MED ORDER — GUAIFENESIN ER 600 MG PO TB12
1200.0000 mg | ORAL_TABLET | Freq: Two times a day (BID) | ORAL | 0 refills | Status: DC | PRN
Start: 2023-04-03 — End: 2023-06-10

## 2023-04-03 NOTE — Telephone Encounter (Signed)
Since cough syrup isn't covered, advise pt to take the cough tabs I sent in yesterday instead

## 2023-04-03 NOTE — Addendum Note (Signed)
Addended by: Suzan Slick on: 04/03/2023 10:28 AM   Modules accepted: Orders

## 2023-04-03 NOTE — Telephone Encounter (Signed)
Called patient to inform her about the cough tabs that was sent to pharmacy yesterday , patient states that the tabs are not covered either by insurance , patient is requesting another medication to be sent in to her pharmacy

## 2023-04-03 NOTE — Telephone Encounter (Signed)
Sent in alternative for her to try. If this isn't covered, unfortunately she will have to continue with the other treatments I sent in. The prednisone taper should help with her cough.

## 2023-04-08 ENCOUNTER — Ambulatory Visit: Payer: Medicaid Other | Admitting: Family Medicine

## 2023-04-08 ENCOUNTER — Telehealth: Payer: Self-pay | Admitting: Family Medicine

## 2023-04-08 NOTE — Telephone Encounter (Signed)
Patient states that she has an ongoing cold. She is not feeling better. She is hoarse, coughing, and still feeling unwell. She states she has finished her prednisone and cough syrup prescribed at last visit but is still not feeling much better. Patient is inquiring about an additional prescription until she can return to the office to see her PCP 04/14/2023. Patient is aware that she may come in sooner to visit with covering physician but due to her transportation situation, she cannot guarantee she can make an in-person appointment. Please advise. Judy Baker

## 2023-04-08 NOTE — Telephone Encounter (Signed)
Spoke with patient and informed her that she would need an appointment to have another medication prescribed, patient states she would have to wait until Monday to come in for a visit,

## 2023-04-14 ENCOUNTER — Ambulatory Visit: Payer: Medicaid Other | Admitting: Family Medicine

## 2023-04-17 ENCOUNTER — Encounter: Payer: Self-pay | Admitting: Family Medicine

## 2023-04-17 ENCOUNTER — Ambulatory Visit (INDEPENDENT_AMBULATORY_CARE_PROVIDER_SITE_OTHER): Payer: Medicaid Other | Admitting: Family Medicine

## 2023-04-17 VITALS — BP 135/78 | HR 92 | Temp 97.6°F | Resp 18 | Ht 67.0 in | Wt 354.0 lb

## 2023-04-17 DIAGNOSIS — J4541 Moderate persistent asthma with (acute) exacerbation: Secondary | ICD-10-CM

## 2023-04-17 DIAGNOSIS — Z139 Encounter for screening, unspecified: Secondary | ICD-10-CM

## 2023-04-17 DIAGNOSIS — E782 Mixed hyperlipidemia: Secondary | ICD-10-CM | POA: Diagnosis not present

## 2023-04-17 DIAGNOSIS — F418 Other specified anxiety disorders: Secondary | ICD-10-CM

## 2023-04-17 MED ORDER — PRAVASTATIN SODIUM 40 MG PO TABS
40.0000 mg | ORAL_TABLET | Freq: Every day | ORAL | 5 refills | Status: DC
Start: 2023-04-17 — End: 2023-07-30

## 2023-04-17 NOTE — Progress Notes (Signed)
Established Patient Office Visit  Subjective   Patient ID: Judy Baker, female    DOB: March 08, 1971  Age: 52 y.o. MRN: [id number]  No chief complaint on file.   HPI  Asthma Pt has hx of asthma and was seen a few weeks ago for an exacerbation. She reports the prednisone helped some but not as much. She had albuterol nebulizer that she states she only uses once a day. She is unable to afford her symbicort at this time. She does report she use to smoke for 30 years but stopped a week ago. She would like to see a pulmonologist for further evaluation. She was unable to get the medications sent in for her for her cough and bronchitis.   Pt needs refills on her Pravastatin 40mg  daily for HLD. Pt also asked about referral to [facility] that was placed. States she never was called. Referral notes reviewed and they reached out to pt 3 x but unable to leave VM so referral was closed. Flowsheet Row Office Visit from 03/13/2023 in [facility]  PHQ-9 Total Score 10       SDOH needs was reviewed with her today. Referral was placed. Pt states it's hard to pay her bills and she doesn't have any heat in her home at this time.  Review of Systems  Respiratory:  Positive for cough and shortness of breath.   All other systems reviewed and are negative.    Objective:     There were no vitals taken for this visit. BP Readings from Last 3 Encounters:  04/17/23 135/78  03/31/23 (!) 140/71  03/13/23 124/75      Physical Exam Vitals and nursing note reviewed.  Constitutional:      Appearance: Normal appearance. She is normal weight.  HENT:     Head: Normocephalic and atraumatic.     Right Ear: External ear normal.     Left Ear: External ear normal.     Nose: Nose normal.     Mouth/Throat:     Mouth: Mucous membranes are moist.     Pharynx: Oropharynx is clear.  Eyes:     Conjunctiva/sclera: Conjunctivae normal.     Pupils: Pupils are equal, round, and reactive to  light.  Cardiovascular:     Rate and Rhythm: Normal rate and regular rhythm.     Pulses: Normal pulses.     Heart sounds: Normal heart sounds.  Pulmonary:     Effort: Pulmonary effort is normal.     Breath sounds: Normal breath sounds.  Abdominal:     General: Abdomen is flat. Bowel sounds are normal.  Skin:    General: Skin is warm.     Capillary Refill: Capillary refill takes less than 2 seconds.  Neurological:     General: No focal deficit present.     Mental Status: She is alert and oriented to person, place, and time. Mental status is at baseline.  Psychiatric:        Mood and Affect: Mood normal.        Behavior: Behavior normal.        Thought Content: Thought content normal.        Judgment: Judgment normal.    No results found for any visits on 04/17/23.     The 10-year ASCVD risk score (Arnett DK, et al., 2019) is: 23.6%    Assessment & Plan:   Problem List Items Addressed This Visit   None  Mixed hyperlipidemia -  Pravastatin Sodium; Take 1 tablet (40 mg total) by mouth daily.  Dispense: 30 tablet; Refill: 5  Depression with anxiety -     Ambulatory referral to Behavioral Health  Moderate persistent asthma with exacerbation -     Ambulatory referral to Pulmonology  Encounter for screening involving social determinants of health (SDoH) -     AMB Referral [facility] Care Management   Refilled Pravastatin 40mg  daily for HLD New referral placed for [facility] for counseling. Asthma and with hx of tobacco abuse, may have overlapping COPD. Will refer to pulmonology for further evaluation. Also refer to Care management due to SDOH needs. Pt unable to afford for utilities and medicines; which effects her health and being compliant with medication regimen.  No follow-ups on file.    Suzan Slick, MD

## 2023-04-22 ENCOUNTER — Other Ambulatory Visit: Payer: Self-pay | Admitting: Family Medicine

## 2023-04-22 DIAGNOSIS — F418 Other specified anxiety disorders: Secondary | ICD-10-CM

## 2023-04-23 ENCOUNTER — Other Ambulatory Visit: Payer: Self-pay

## 2023-04-23 NOTE — Patient Instructions (Signed)
Visit Information  Judy Baker was given information about Medicaid Managed Care team care coordination services as a part of their [facility] Community Plan Medicaid benefit. Judy Baker verbally consented to engagement with the [facility] Managed Care team.   If you are experiencing a medical emergency, please call 911 or report to your local emergency department or urgent care.   If you have a non-emergency medical problem during routine business hours, please contact your provider's office and ask to speak with a nurse.   For questions related to your [facility], please call: [phone] or visit the homepage here: [email or web address]  If you would like to schedule transportation through your [facility], please call the following number at least 2 days in advance of your appointment: [phone]   Rides for urgent appointments can also be made after hours by calling Member Services.  Call the Behavioral Health Crisis Line at [phone], at any time, 24 hours a day, 7 days a week. If you are in danger or need immediate medical attention call 911.  If you would like help to quit smoking, call 1-800-QUIT-NOW (601-086-4289) OR Espaol: 1-855-Djelo-Ya (0-109-323-5573) o para ms informacin haga clic aqu or Text READY to 220-254 to register via text  Ms. Pacer - following are the goals we discussed in your visit today:   Goals Addressed   None      Social Worker will follow up on 05/01/23.   Judy Baker, Judy Baker, MHA [facility]  Managed Medicaid Social Worker [phone]   Following is a copy of your plan of care:  There are no care plans that you recently modified to display for this patient.

## 2023-04-23 NOTE — Patient Outreach (Signed)
  Medicaid Managed Care Social Work Note  04/23/2023 Name:  Judy Baker MRN:  [id number] DOB:  1970-08-24  Judy Baker is an 52 y.o. year old female who is a primary patient of Rucker, Magdalen Spatz, MD.  The Medicaid Managed Care Coordination team was consulted for assistance with:  Community Resources   Judy Baker was given information about Medicaid Managed Care Coordination team services today. Judy Baker Patient agreed to services and verbal consent obtained.  Engaged with patient  for by telephone forinitial visit in response to referral for case management and/or care coordination services.   Assessments/Interventions:  Review of past medical history, allergies, medications, health status, including review of consultants reports, laboratory and other test data, was performed as part of comprehensive evaluation and provision of chronic care management services.  SDOH: (Social Determinant of Health) assessments and interventions performed: SDOH Interventions    Flowsheet Row Office Visit from 04/17/2023 in [facility] at [facility] Visit from 03/13/2023 in [facility] at [facility]  SDOH Interventions    Transportation Interventions AMB Referral --  Depression Interventions/Treatment  -- Referral to Psychiatry  Financial Strain Interventions [id number] Referral --     BSW completed a telephone outreach with patient, she states she is in need of assistance with her gas bill that is cut off. Patient states she owes about $1200 and is unable to pay due to only receieving 941 monthly in SSI. Patient lives alone. She states she does receieve foodstamps and has reached out to DSS and helping hands. BSW and patient agreed for utility resources to be emailed to [email or web address]  Advanced Directives Status:  Not addressed in this encounter.  Care Plan                 Allergies  Allergen Reactions   Molds & Smuts    Grass  Pollen(K-O-R-T-Swt Vern)     Allscripts Description: Grass   Penicillins Hives and Swelling    Did it involve swelling of the face/tongue/throat, SOB, or low BP? Yes Did it involve sudden or severe rash/hives, skin peeling, or any reaction on the inside of your mouth or nose? Yes Did you need to seek medical attention at a hospital or doctor's office? Yes When did it last happen? childhood (Teenage) allergy  If all above answers are "NO", may proceed with cephalosporin use.    Sulfa Antibiotics Swelling    Medications Reviewed Today   Medications were not reviewed in this encounter     Patient Active Problem List   Diagnosis Date Noted   OSA on CPAP 12/10/2022   Thoracic and lumbosacral neuritis 05/11/2021   Vitamin D deficiency 02/05/2021   Unstable angina (HCC) 03/30/2019   GERD (gastroesophageal reflux disease) 02/04/2019   HLD (hyperlipidemia) 02/04/2019   HTN (hypertension) 02/04/2019    Conditions to be addressed/monitored per PCP order:   utility resources  There are no care plans that you recently modified to display for this patient.   Follow up:  Patient agrees to Care Plan and Follow-up.  Plan: The Managed Medicaid care management team will reach out to the patient again over the next 7 days.  Date/time of next scheduled Social Work care management/care coordination outreach:  05/01/23  Gus Puma, Kenard Gower, [facility]  Managed [facility] Social Worker [phone]

## 2023-05-01 ENCOUNTER — Other Ambulatory Visit: Payer: Self-pay

## 2023-05-01 NOTE — Patient Instructions (Signed)
Visit Information  Judy Baker was given information about Medicaid Managed Care team care coordination services as a part of their [facility] Community Plan Medicaid benefit. Judy Baker verbally consented to engagement with the [facility] Managed Care team.   If you are experiencing a medical emergency, please call 911 or report to your local emergency department or urgent care.   If you have a non-emergency medical problem during routine business hours, please contact your provider's office and ask to speak with a nurse.   For questions related to your [facility], please call: [phone] or visit the homepage here: [email or web address]  If you would like to schedule transportation through your [facility], please call the following number at least 2 days in advance of your appointment: [phone]   Rides for urgent appointments can also be made after hours by calling Member Services.  Call the Behavioral Health Crisis Line at [phone], at any time, 24 hours a day, 7 days a week. If you are in danger or need immediate medical attention call 911.  If you would like help to quit smoking, call 1-800-QUIT-NOW ((281)521-1859) OR Espaol: 1-855-Djelo-Ya (2-536-644-0347) o para ms informacin haga clic aqu or Text READY to 425-956 to register via text  Ms. Teson - following are the goals we discussed in your visit today:   Goals Addressed   None       Social Worker will follow up on 06/03/23.   Gus Puma, Kenard Gower, MHA [facility]  Managed Medicaid Social Worker [phone]   Following is a copy of your plan of care:  There are no care plans that you recently modified to display for this patient.

## 2023-05-01 NOTE — Patient Outreach (Signed)
  Medicaid Managed Care Social Work Note  05/01/2023 Name:  Judy Baker MRN:  [id number] DOB:  1971-01-28  Judy Baker is an 52 y.o. year old female who is a primary patient of Rucker, Judy Spatz, MD.  The Medicaid Managed Care Coordination team was consulted for assistance with:  Community Resources   [name] was given information about Medicaid Managed Care Coordination team services today. [name] Patient agreed to services and verbal consent obtained.  Engaged with patient  for by telephone forfollow up visit in response to referral for case management and/or care coordination services.   Assessments/Interventions:  Review of past medical history, allergies, medications, health status, including review of consultants reports, laboratory and other test data, was performed as part of comprehensive evaluation and provision of chronic care management services.  SDOH: (Social Determinant of Health) assessments and interventions performed: SDOH Interventions    Flowsheet Row Office Visit from 04/17/2023 in [facility] at [facility] Visit from 03/13/2023 in [facility] at [facility]  SDOH Interventions    Transportation Interventions AMB Referral --  Depression Interventions/Treatment  -- Referral to Psychiatry  Financial Strain Interventions [id number] Referral --     BSW completed a telephone outreach with patient, she stated she did not receive the resources [employer]. Patient stated she did call around to some places but they were unable to assist. BSW encouraged patient to try DSS on 11/1 as they should get funds tomorrow. BSW resent resources via email to patient. No other resources are needed at this time.  Advanced Directives Status:  Not addressed in this encounter.  Care Plan                 Allergies  Allergen Reactions   Molds & Smuts    Grass Pollen(K-O-R-T-Swt Vern)     Allscripts Description: Grass   Penicillins Hives  and Swelling    Did it involve swelling of the face/tongue/throat, SOB, or low BP? Yes Did it involve sudden or severe rash/hives, skin peeling, or any reaction on the inside of your mouth or nose? Yes Did you need to seek medical attention at a hospital or doctor's office? Yes When did it last happen? childhood (Teenage) allergy  If all above answers are "NO", may proceed with cephalosporin use.    Sulfa Antibiotics Swelling    Medications Reviewed Today   Medications were not reviewed in this encounter     Patient Active Problem List   Diagnosis Date Noted   OSA on CPAP 12/10/2022   Thoracic and lumbosacral neuritis 05/11/2021   Vitamin D deficiency 02/05/2021   Unstable angina (HCC) 03/30/2019   GERD (gastroesophageal reflux disease) 02/04/2019   HLD (hyperlipidemia) 02/04/2019   HTN (hypertension) 02/04/2019    Conditions to be addressed/monitored per PCP order:   community resources  There are no care plans that you recently modified to display for this patient.   Follow up:  Patient agrees to Care Plan and Follow-up.  Plan: The Managed Medicaid care management team will reach out to the patient again over the next 30 days.  Date/time of next scheduled Social Work care management/care coordination outreach:  06/03/23  Gus Puma, Kenard Gower, [facility]  Managed [facility] Social Worker [phone]

## 2023-05-13 ENCOUNTER — Other Ambulatory Visit: Payer: Self-pay | Admitting: Physician Assistant

## 2023-05-13 DIAGNOSIS — M12819 Other specific arthropathies, not elsewhere classified, unspecified shoulder: Secondary | ICD-10-CM

## 2023-05-15 ENCOUNTER — Telehealth: Payer: Self-pay | Admitting: Family Medicine

## 2023-05-15 MED ORDER — TRAMADOL HCL 50 MG PO TABS: 50.0000 mg | ORAL_TABLET | Freq: Three times a day (TID) | ORAL | 0 refills | Status: AC | PRN

## 2023-05-15 NOTE — Telephone Encounter (Signed)
7 day supply sent in due to regulations on pain medicine. Take for severe pain only so this will last her until her appointment with pain management.

## 2023-05-15 NOTE — Telephone Encounter (Signed)
Patient requesting a fill of Tramadol to last until her upcoming appointment with pain management on 06/03/2023. Patient states missed her last appointment due to being sick but states she has a torn rotator cuff that will likely require surgery and is requesting a temporary fill of tramadol "like the last time" until she can establish with pain management. Preferred pharmacy is [facility] [address] in [place]. Please advise. Judy Baker

## 2023-05-16 NOTE — Telephone Encounter (Signed)
LVM for patient to return call at earliest convenience    RE:  Left voice message for patient to return call to discuss message from PCP

## 2023-05-18 ENCOUNTER — Ambulatory Visit
Admission: RE | Admit: 2023-05-18 | Discharge: 2023-05-18 | Disposition: A | Discharge status: Home / Self Care | Payer: Medicaid Other | Source: Ambulatory Visit | Attending: Physician Assistant | Admitting: Physician Assistant

## 2023-05-18 DIAGNOSIS — M75100 Unspecified rotator cuff tear or rupture of unspecified shoulder, not specified as traumatic: Secondary | ICD-10-CM

## 2023-05-21 ENCOUNTER — Ambulatory Visit: Payer: Medicaid Other

## 2023-05-22 ENCOUNTER — Telehealth: Payer: Self-pay | Admitting: Family Medicine

## 2023-05-22 NOTE — Telephone Encounter (Signed)
Patient requesting a referral for home health care part-time. States is having a hard time taking care of herself with the recent injury to her rotator cuff. Ortho provider stats she likely will need an entire shoulder replacement. Right rotator cuff, dominant hand. Please advise. Katha Hamming

## 2023-05-22 NOTE — Telephone Encounter (Signed)
Patient agreeable to video visit. Patient states she would like it done ASAP. Scheduled patient for 9:50 (as patient is having severe trouble sleeping and is very unlikely to make an 800).

## 2023-05-22 NOTE — Telephone Encounter (Signed)
Please inform pt that I'll have to send notes to [facility] for justification. She can make a video visit with me to address the need for home health. Either 810am or 110pm for video visit please.

## 2023-05-23 ENCOUNTER — Encounter: Payer: Self-pay | Admitting: Family Medicine

## 2023-05-23 ENCOUNTER — Telehealth (INDEPENDENT_AMBULATORY_CARE_PROVIDER_SITE_OTHER): Payer: Medicaid Other | Admitting: Family Medicine

## 2023-05-23 DIAGNOSIS — G8929 Other chronic pain: Secondary | ICD-10-CM

## 2023-05-23 DIAGNOSIS — M25511 Pain in right shoulder: Secondary | ICD-10-CM | POA: Diagnosis not present

## 2023-05-23 DIAGNOSIS — M12811 Other specific arthropathies, not elsewhere classified, right shoulder: Secondary | ICD-10-CM | POA: Diagnosis not present

## 2023-05-23 DIAGNOSIS — Z741 Need for assistance with personal care: Secondary | ICD-10-CM | POA: Diagnosis not present

## 2023-05-23 NOTE — Progress Notes (Signed)
Virtual Visit via Video Note  I connected with Judy Baker on 05/23/23 at  9:50 AM EST by a video enabled telemedicine application and verified that I am speaking with the correct person using two identifiers.  Location: Patient: home in [place]  Provider: Elizabeth Pal[facility]   I discussed the limitations of evaluation and management by telemedicine and the availability of in person appointments. The patient expressed understanding and agreed to proceed.  History of Present Illness: Pt here for video visit requesting home health aide. She reports she is seeing [facility] for her right shoulder. She has hx of arthritis in the right shoulder with evidence of bone spurs many months ago. She reports her right shoulder pain is worsening and she has difficulty with using her right arm along with lifting. It's difficult for her to do ADL's such as cooking, bathing, taking care of her home etc. She goes for an MRI of her right shoulder for suspected rotator cuff tear. She has to then follow up with Ortho the first week of December to discuss possible surgery. Pt would like a home health aide to help with ADL's 2-4 hours a day, 7 days a week if possible   Observations/Objective: Physical Exam Vitals and nursing note reviewed.  Constitutional:      Appearance: She is obese.  HENT:     Head: Normocephalic and atraumatic.     Right Ear: External ear normal.     Left Ear: External ear normal.     Nose: Nose normal.     Mouth/Throat:     Mouth: Mucous membranes are dry.  Eyes:     Extraocular Movements: Extraocular movements intact.  Musculoskeletal:     Comments: Unable to lift right shoulder above 45 degrees  Neurological:     General: No focal deficit present.     Mental Status: She is alert and oriented to person, place, and time. Mental status is at baseline.  Psychiatric:        Mood and Affect: Mood normal.        Behavior: Behavior normal.         Thought Content: Thought content normal.        Judgment: Judgment normal.     Assessment and Plan: Chronic right shoulder pain -     Ambulatory referral to Home Health  Requires assistance with activities of daily living (ADL) -     Ambulatory referral to Home Health  Rotator cuff arthropathy of right shoulder -     Ambulatory referral to Home Health     Follow Up Instructions: Home health aide referral sent.    I discussed the assessment and treatment plan with the patient. The patient was provided an opportunity to ask questions and all were answered. The patient agreed with the plan and demonstrated an understanding of the instructions.   The patient was advised to call back or seek an in-person evaluation if the symptoms worsen or if the condition fails to improve as anticipated.  I provided 5 minutes of non-face-to-face time during this encounter.   Suzan Slick, MD

## 2023-05-26 ENCOUNTER — Encounter: Payer: Self-pay | Admitting: Family Medicine

## 2023-06-02 ENCOUNTER — Telehealth (HOSPITAL_COMMUNITY): Payer: Self-pay | Admitting: Psychiatry

## 2023-06-03 ENCOUNTER — Other Ambulatory Visit: Payer: Self-pay

## 2023-06-03 NOTE — Patient Outreach (Signed)
  Medicaid Managed Care Social Work Note  06/03/2023 Name:  Judy Baker MRN:  [id number] DOB:  1971-02-09  Judy Baker is an 52 y.o. year old female who is a primary patient of Rucker, Magdalen Spatz, MD.  The Medicaid Managed Care Coordination team was consulted for assistance with:  Community Resources   Judy Baker was given information about Medicaid Managed Care Coordination team services today. Judy Baker Patient agreed to services and verbal consent obtained.  Engaged with patient  for by telephone forfollow up visit in response to referral for case management and/or care coordination services.   Assessments/Interventions:  Review of past medical history, allergies, medications, health status, including review of consultants reports, laboratory and other test data, was performed as part of comprehensive evaluation and provision of chronic care management services.  SDOH: (Social Determinant of Health) assessments and interventions performed: SDOH Interventions    Flowsheet Row Office Visit from 04/17/2023 in [facility] at [facility] Visit from 03/13/2023 in [facility] at [facility]  SDOH Interventions    Transportation Interventions AMB Referral --  Depression Interventions/Treatment  -- Referral to Psychiatry  Financial Strain Interventions [id number] Referral --     BSW completed a telephone outreach with patient. She states she has not located anyone that will be able to help. She states DSS will not have funds until the beginning of the year.   Advanced Directives Status:  Not addressed in this encounter.  Care Plan                 Allergies  Allergen Reactions   Molds & Smuts    Grass Pollen(K-O-R-T-Swt Vern)     Allscripts Description: Grass   Penicillins Hives and Swelling    Did it involve swelling of the face/tongue/throat, SOB, or low BP? Yes Did it involve sudden or severe rash/hives, skin peeling, or any reaction on the  inside of your mouth or nose? Yes Did you need to seek medical attention at a hospital or doctor's office? Yes When did it last happen? childhood (Teenage) allergy  If all above answers are "NO", may proceed with cephalosporin use.    Sulfa Antibiotics Swelling    Medications Reviewed Today   Medications were not reviewed in this encounter     Patient Active Problem List   Diagnosis Date Noted   OSA on CPAP 12/10/2022   Thoracic and lumbosacral neuritis 05/11/2021   Vitamin D deficiency 02/05/2021   Unstable angina (HCC) 03/30/2019   GERD (gastroesophageal reflux disease) 02/04/2019   HLD (hyperlipidemia) 02/04/2019   HTN (hypertension) 02/04/2019    Conditions to be addressed/monitored per PCP order:   community resources  There are no care plans that you recently modified to display for this patient.   Follow up:  Patient agrees to Care Plan and Follow-up.  Plan: The Managed Medicaid care management team will reach out to the patient again over the next 30 days.  Date/time of next scheduled Social Work care management/care coordination outreach:  07/09/2022  Judy Baker, Kenard Gower, [facility]  Managed [facility] Social Worker [phone]

## 2023-06-03 NOTE — Patient Instructions (Signed)
Visit Information  Ms. Darney was given information about Medicaid Managed Care team care coordination services as a part of their [facility] Community Plan Medicaid benefit. Osmary Schleifer verbally consented to engagement with the [facility] Managed Care team.   If you are experiencing a medical emergency, please call 911 or report to your local emergency department or urgent care.   If you have a non-emergency medical problem during routine business hours, please contact your provider's office and ask to speak with a nurse.   For questions related to your [facility], please call: [phone] or visit the homepage here: [email or web address]  If you would like to schedule transportation through your [facility], please call the following number at least 2 days in advance of your appointment: [phone]   Rides for urgent appointments can also be made after hours by calling Member Services.  Call the Behavioral Health Crisis Line at [phone], at any time, 24 hours a day, 7 days a week. If you are in danger or need immediate medical attention call 911.  If you would like help to quit smoking, call 1-800-QUIT-NOW ((204)376-9940) OR Espaol: 1-855-Djelo-Ya (1-324-401-0272) o para ms informacin haga clic aqu or Text READY to 536-644 to register via text  Ms. Stroble - following are the goals we discussed in your visit today:   Goals Addressed   None       Social Worker will follow up in 30 days.   Gus Puma, Kenard Gower, MHA [facility]  Managed Medicaid Social Worker [phone]   Following is a copy of your plan of care:  There are no care plans that you recently modified to display for this patient.

## 2023-06-09 ENCOUNTER — Ambulatory Visit: Payer: Self-pay | Admitting: Family Medicine

## 2023-06-09 NOTE — Telephone Encounter (Signed)
Chief Complaint: cough Symptoms: productive cough, runny nose, SOB, wheezing, chest tightness Frequency: x 4 days Pertinent Negatives: Patient denies fevers, dizziness Disposition: [x] ED /[] Urgent Care (no appt availability in office) / [] Appointment(In office/virtual)/ []  [facility]/ [] Home Care/ [] Refused Recommended Disposition /[] [facility]/ []  Follow-up with PCP Additional Notes: Patient c/o felt like a cold was coming on Thursday night. Pt states on Friday she started having symptoms of SOB (worse when lying flat), chest tightness, "clear mucus" productive cough, and wheezing. Pt states it feels like her asthma is worse and she has been taking her nebulizer treatments. Pt states she had a similar episode and was prescribed breathing treatments and prednisone which she improved on. Pt states her SOB is "pretty bad" stating she felt SOB going to the bathroom and couldn't lie down without feeling SOB. Recommended disposition for pt to go to ED. Pt refused and states its a "waste of time and I just sat in the lobby" and states "they just pulled me back and gave me breathing treatments". Advised patient to call 911 or go straight to ED if SOB or chest pain worsens.  Copied from CRM [id number]. Topic: Clinical - Red Word Triage >> Jun 09, 2023  3:51 PM Colletta Maryland S wrote: Red Word that prompted transfer to Nurse Triage: pt has cold which triggered asthma, has been using nebulizer every 4-5 hours, pt is still having shortness of breath, wheezing, tightness of chest, and coughing Reason for Disposition  [1] MODERATE difficulty breathing (e.g., speaks in phrases, SOB even at rest, pulse 100-120) AND [2] NEW-onset or WORSE than normal  Answer Assessment - Initial Assessment Questions 1. RESPIRATORY STATUS: "Describe your breathing?" (e.g., wheezing, shortness of breath, unable to speak, severe coughing)      Wheezing states feels like an asthma attack, "lot of coughing", SOB  2.  ONSET: "When did this breathing problem begin?"      Since Friday 3. PATTERN "Does the difficult breathing come and go, or has it been constant since it started?"      Constant  4. SEVERITY: "How bad is your breathing?" (e.g., mild, moderate, severe)    - MILD: No SOB at rest, mild SOB with walking, speaks normally in sentences, can lie down, no retractions, pulse < 100.    - MODERATE: SOB at rest, SOB with minimal exertion and prefers to sit, cannot lie down flat, speaks in phrases, mild retractions, audible wheezing, pulse 100-120.    - SEVERE: Very SOB at rest, speaks in single words, struggling to breathe, sitting hunched forward, retractions, pulse > 120      "Pretty bad", patient states she feels SOB "even just getting up and going to the bathroom"; worse when lying down "I have to sit up real fast"  5. RECURRENT SYMPTOM: "Have you had difficulty breathing before?" If Yes, ask: "When was the last time?" and "What happened that time?"      Pt states she has felt like this before with symptoms; patient states it happens this time of year. States she just recently an episode similar to this and was treated with "prednisone and breathing treatments".  6. CARDIAC HISTORY: "Do you have any history of heart disease?" (e.g., heart attack, angina, bypass surgery, angioplasty)      Denies cardiac history.  7. LUNG HISTORY: "Do you have any history of lung disease?"  (e.g., pulmonary embolus, asthma, emphysema)     Asthma.  8. CAUSE: "What do you think is causing the breathing  problem?"      Pt states she does not know. Pt states it felt like a cold coming on.  9. OTHER SYMPTOMS: "Do you have any other symptoms? (e.g., dizziness, runny nose, cough, chest pain, fever)     Cough, chest tightness, runny nose.  10. O2 SATURATION MONITOR:  "Do you use an oxygen saturation monitor (pulse oximeter) at home?" If Yes, ask: "What is your reading (oxygen level) today?" "What is your usual oxygen saturation  reading?" (e.g., 95%)       Pt state she might have one but doesn't think it would have batteries in it.  11. TRAVEL: "Have you traveled out of the country in the last month?" (e.g., travel history, exposures)       Denies.  Protocols used: Breathing Difficulty-A-AH

## 2023-06-10 ENCOUNTER — Emergency Department (HOSPITAL_COMMUNITY): Payer: Medicaid Other

## 2023-06-10 ENCOUNTER — Inpatient Hospital Stay (HOSPITAL_COMMUNITY)
Admission: EM | Admit: 2023-06-10 | Discharge: 2023-06-18 | DRG: 190 | Disposition: A | Discharge status: Home / Self Care | Payer: Medicaid Other | Attending: Internal Medicine | Admitting: Internal Medicine

## 2023-06-10 ENCOUNTER — Encounter (HOSPITAL_COMMUNITY): Payer: Self-pay

## 2023-06-10 ENCOUNTER — Other Ambulatory Visit: Payer: Self-pay

## 2023-06-10 DIAGNOSIS — G4733 Obstructive sleep apnea (adult) (pediatric): Secondary | ICD-10-CM | POA: Diagnosis present

## 2023-06-10 DIAGNOSIS — E785 Hyperlipidemia, unspecified: Secondary | ICD-10-CM | POA: Diagnosis present

## 2023-06-10 DIAGNOSIS — J209 Acute bronchitis, unspecified: Secondary | ICD-10-CM | POA: Diagnosis present

## 2023-06-10 DIAGNOSIS — Z882 Allergy status to sulfonamides status: Secondary | ICD-10-CM

## 2023-06-10 DIAGNOSIS — J440 Chronic obstructive pulmonary disease with acute lower respiratory infection: Secondary | ICD-10-CM | POA: Diagnosis present

## 2023-06-10 DIAGNOSIS — E876 Hypokalemia: Secondary | ICD-10-CM | POA: Diagnosis present

## 2023-06-10 DIAGNOSIS — J441 Chronic obstructive pulmonary disease with (acute) exacerbation: Secondary | ICD-10-CM | POA: Diagnosis not present

## 2023-06-10 DIAGNOSIS — E8809 Other disorders of plasma-protein metabolism, not elsewhere classified: Secondary | ICD-10-CM | POA: Diagnosis not present

## 2023-06-10 DIAGNOSIS — Z880 Allergy status to penicillin: Secondary | ICD-10-CM

## 2023-06-10 DIAGNOSIS — Z9049 Acquired absence of other specified parts of digestive tract: Secondary | ICD-10-CM

## 2023-06-10 DIAGNOSIS — M1990 Unspecified osteoarthritis, unspecified site: Secondary | ICD-10-CM | POA: Diagnosis present

## 2023-06-10 DIAGNOSIS — Z6841 Body Mass Index (BMI) 40.0 and over, adult: Secondary | ICD-10-CM

## 2023-06-10 DIAGNOSIS — Z975 Presence of (intrauterine) contraceptive device: Secondary | ICD-10-CM

## 2023-06-10 DIAGNOSIS — Z79899 Other long term (current) drug therapy: Secondary | ICD-10-CM

## 2023-06-10 DIAGNOSIS — F17210 Nicotine dependence, cigarettes, uncomplicated: Secondary | ICD-10-CM | POA: Diagnosis present

## 2023-06-10 DIAGNOSIS — B370 Candidal stomatitis: Secondary | ICD-10-CM | POA: Diagnosis not present

## 2023-06-10 DIAGNOSIS — T380X5A Adverse effect of glucocorticoids and synthetic analogues, initial encounter: Secondary | ICD-10-CM | POA: Diagnosis not present

## 2023-06-10 DIAGNOSIS — Z1152 Encounter for screening for COVID-19: Secondary | ICD-10-CM

## 2023-06-10 DIAGNOSIS — Z888 Allergy status to other drugs, medicaments and biological substances status: Secondary | ICD-10-CM

## 2023-06-10 DIAGNOSIS — Z823 Family history of stroke: Secondary | ICD-10-CM

## 2023-06-10 DIAGNOSIS — Z8249 Family history of ischemic heart disease and other diseases of the circulatory system: Secondary | ICD-10-CM

## 2023-06-10 DIAGNOSIS — K219 Gastro-esophageal reflux disease without esophagitis: Secondary | ICD-10-CM | POA: Diagnosis present

## 2023-06-10 DIAGNOSIS — F32A Depression, unspecified: Secondary | ICD-10-CM | POA: Diagnosis present

## 2023-06-10 DIAGNOSIS — J9601 Acute respiratory failure with hypoxia: Secondary | ICD-10-CM | POA: Diagnosis present

## 2023-06-10 DIAGNOSIS — E871 Hypo-osmolality and hyponatremia: Secondary | ICD-10-CM | POA: Diagnosis not present

## 2023-06-10 DIAGNOSIS — I10 Essential (primary) hypertension: Secondary | ICD-10-CM | POA: Diagnosis present

## 2023-06-10 DIAGNOSIS — Z7951 Long term (current) use of inhaled steroids: Secondary | ICD-10-CM

## 2023-06-10 LAB — CBC
HCT: 48.1 % — ABNORMAL HIGH (ref 36.0–46.0)
Hemoglobin: 15.8 g/dL — ABNORMAL HIGH (ref 12.0–15.0)
MCH: 31.7 pg (ref 26.0–34.0)
MCHC: 32.8 g/dL (ref 30.0–36.0)
MCV: 96.4 fL (ref 80.0–100.0)
Platelets: 218 10*3/uL (ref 150–400)
RBC: 4.99 MIL/uL (ref 3.87–5.11)
RDW: 13.2 % (ref 11.5–15.5)
WBC: 4.2 10*3/uL (ref 4.0–10.5)
nRBC: 0 % (ref 0.0–0.2)

## 2023-06-10 LAB — COMPREHENSIVE METABOLIC PANEL
ALT: 42 U/L (ref 0–44)
AST: 38 U/L (ref 15–41)
Albumin: 4 g/dL (ref 3.5–5.0)
Alkaline Phosphatase: 80 U/L (ref 38–126)
Anion gap: 13 (ref 5–15)
BUN: 11 mg/dL (ref 6–20)
CO2: 24 mmol/L (ref 22–32)
Calcium: 9.9 mg/dL (ref 8.9–10.3)
Chloride: 100 mmol/L (ref 98–111)
Creatinine, Ser: 0.82 mg/dL (ref 0.44–1.00)
GFR, Estimated: >60 mL/min (ref >60)
Glucose, Bld: 103 mg/dL — ABNORMAL HIGH (ref 70–99)
Potassium: 3.1 mmol/L — ABNORMAL LOW (ref 3.5–5.1)
Sodium: 137 mmol/L (ref 135–145)
Total Bilirubin: 0.5 mg/dL (ref <1.2)
Total Protein: 8.1 g/dL (ref 6.5–8.1)

## 2023-06-10 LAB — BLOOD GAS, VENOUS
Acid-Base Excess: 3.3 mmol/L — ABNORMAL HIGH (ref 0.0–2.0)
Bicarbonate: 27.8 mmol/L (ref 20.0–28.0)
O2 Saturation: 87.4 %
Patient temperature: 37
pCO2, Ven: 41 mm[Hg] — ABNORMAL LOW (ref 44–60)
pH, Ven: 7.44 — ABNORMAL HIGH (ref 7.25–7.43)
pO2, Ven: 56 mm[Hg] — ABNORMAL HIGH (ref 32–45)

## 2023-06-10 LAB — RESP PANEL BY RT-PCR (RSV, FLU A&B, COVID)  RVPGX2
Influenza A by PCR: NEGATIVE
Influenza B by PCR: NEGATIVE
Resp Syncytial Virus by PCR: NEGATIVE
SARS Coronavirus 2 by RT PCR: NEGATIVE

## 2023-06-10 LAB — HIV ANTIBODY (ROUTINE TESTING W REFLEX): HIV Screen 4th Generation wRfx: NONREACTIVE

## 2023-06-10 LAB — HCG, QUANTITATIVE, PREGNANCY: hCG, Beta Chain, Quant, S: 5 m[IU]/mL — ABNORMAL HIGH (ref <5)

## 2023-06-10 LAB — D-DIMER, QUANTITATIVE: D-Dimer, Quant: <0.27 ug{FEU}/mL (ref 0.00–0.50)

## 2023-06-10 MED ORDER — ONDANSETRON HCL 4 MG/2ML IJ SOLN: 4.0000 mg | Freq: Four times a day (QID) | INTRAMUSCULAR | Status: DC | PRN

## 2023-06-10 MED ORDER — ONDANSETRON HCL 4 MG/2ML IJ SOLN
4.0000 mg | Freq: Once | INTRAMUSCULAR | Status: AC
  Administered 2023-06-10: 4 mg via INTRAVENOUS
  Filled 2023-06-10: qty 2

## 2023-06-10 MED ORDER — TRAZODONE HCL 100 MG PO TABS
100.0000 mg | ORAL_TABLET | Freq: Every day | ORAL | Status: DC
  Administered 2023-06-10 – 2023-06-17 (×8): 100 mg via ORAL
  Filled 2023-06-10 (×9): qty 1

## 2023-06-10 MED ORDER — ALBUTEROL SULFATE (2.5 MG/3ML) 0.083% IN NEBU
2.5000 mg | INHALATION_SOLUTION | RESPIRATORY_TRACT | Status: DC | PRN
  Administered 2023-06-10 – 2023-06-13 (×3): 2.5 mg via RESPIRATORY_TRACT
  Filled 2023-06-10 (×3): qty 3

## 2023-06-10 MED ORDER — MONTELUKAST SODIUM 10 MG PO TABS
10.0000 mg | ORAL_TABLET | Freq: Every day | ORAL | Status: DC
Start: 2023-06-10 — End: 2023-06-18
  Administered 2023-06-10 – 2023-06-17 (×8): 10 mg via ORAL
  Filled 2023-06-10 (×8): qty 1

## 2023-06-10 MED ORDER — ACETAMINOPHEN 650 MG RE SUPP: 650.0000 mg | Freq: Four times a day (QID) | RECTAL | Status: DC | PRN

## 2023-06-10 MED ORDER — FLUTICASONE PROPIONATE 50 MCG/ACT NA SUSP: 1.0000 | Freq: Two times a day (BID) | NASAL | Status: DC | PRN

## 2023-06-10 MED ORDER — ENOXAPARIN SODIUM 80 MG/0.8ML IJ SOSY
70.0000 mg | PREFILLED_SYRINGE | INTRAMUSCULAR | Status: DC
  Administered 2023-06-10 – 2023-06-13 (×4): 70 mg via SUBCUTANEOUS
  Filled 2023-06-10 (×4): qty 0.8

## 2023-06-10 MED ORDER — ACETAMINOPHEN 325 MG PO TABS
650.0000 mg | ORAL_TABLET | Freq: Four times a day (QID) | ORAL | Status: DC | PRN
  Filled 2023-06-10: qty 2

## 2023-06-10 MED ORDER — TRAMADOL HCL 50 MG PO TABS
50.0000 mg | ORAL_TABLET | Freq: Three times a day (TID) | ORAL | Status: DC | PRN
  Administered 2023-06-11 – 2023-06-12 (×3): 50 mg via ORAL
  Filled 2023-06-10 (×4): qty 1

## 2023-06-10 MED ORDER — METHYLPREDNISOLONE SODIUM SUCC 40 MG IJ SOLR
40.0000 mg | Freq: Two times a day (BID) | INTRAMUSCULAR | Status: DC
  Administered 2023-06-10 – 2023-06-11 (×2): 40 mg via INTRAVENOUS
  Filled 2023-06-10 (×2): qty 1

## 2023-06-10 MED ORDER — METHYLPREDNISOLONE SODIUM SUCC 125 MG IJ SOLR: 125.0000 mg | Freq: Once | INTRAMUSCULAR | Status: DC

## 2023-06-10 MED ORDER — POTASSIUM CHLORIDE CRYS ER 20 MEQ PO TBCR
40.0000 meq | EXTENDED_RELEASE_TABLET | Freq: Once | ORAL | Status: AC
  Administered 2023-06-10: 40 meq via ORAL
  Filled 2023-06-10: qty 2

## 2023-06-10 MED ORDER — DULOXETINE HCL 30 MG PO CPEP
60.0000 mg | ORAL_CAPSULE | Freq: Every day | ORAL | Status: DC
  Administered 2023-06-10 – 2023-06-18 (×9): 60 mg via ORAL
  Filled 2023-06-10 (×9): qty 2

## 2023-06-10 MED ORDER — LABETALOL HCL 100 MG PO TABS
100.0000 mg | ORAL_TABLET | Freq: Two times a day (BID) | ORAL | Status: DC
  Administered 2023-06-10 – 2023-06-18 (×16): 100 mg via ORAL
  Filled 2023-06-10 (×16): qty 1

## 2023-06-10 MED ORDER — PANTOPRAZOLE SODIUM 40 MG PO TBEC
40.0000 mg | DELAYED_RELEASE_TABLET | Freq: Every day | ORAL | Status: DC
  Administered 2023-06-10 – 2023-06-13 (×4): 40 mg via ORAL
  Filled 2023-06-10 (×4): qty 1

## 2023-06-10 MED ORDER — ONDANSETRON HCL 4 MG PO TABS
4.0000 mg | ORAL_TABLET | Freq: Four times a day (QID) | ORAL | Status: DC | PRN
  Administered 2023-06-11: 4 mg via ORAL
  Filled 2023-06-10: qty 1

## 2023-06-10 MED ORDER — HYDROCODONE-ACETAMINOPHEN 10-325 MG PO TABS
1.0000 | ORAL_TABLET | Freq: Two times a day (BID) | ORAL | Status: DC | PRN
  Administered 2023-06-10 – 2023-06-18 (×11): 1 via ORAL
  Filled 2023-06-10 (×12): qty 1

## 2023-06-10 MED ORDER — NICOTINE 7 MG/24HR TD PT24
7.0000 mg | MEDICATED_PATCH | Freq: Every day | TRANSDERMAL | Status: DC
  Administered 2023-06-11 – 2023-06-18 (×8): 7 mg via TRANSDERMAL
  Filled 2023-06-10 (×9): qty 1

## 2023-06-10 MED ORDER — AMLODIPINE BESYLATE 5 MG PO TABS
5.0000 mg | ORAL_TABLET | Freq: Every day | ORAL | Status: DC
  Administered 2023-06-10 – 2023-06-17 (×8): 5 mg via ORAL
  Filled 2023-06-10 (×8): qty 1

## 2023-06-10 MED ORDER — HYDROCHLOROTHIAZIDE 25 MG PO TABS
25.0000 mg | ORAL_TABLET | Freq: Every day | ORAL | Status: DC
  Administered 2023-06-10: 25 mg via ORAL
  Filled 2023-06-10: qty 1

## 2023-06-10 MED ORDER — ALBUTEROL SULFATE (2.5 MG/3ML) 0.083% IN NEBU
10.0000 mg | INHALATION_SOLUTION | Freq: Once | RESPIRATORY_TRACT | Status: AC
  Administered 2023-06-10: 10 mg via RESPIRATORY_TRACT
  Filled 2023-06-10: qty 12

## 2023-06-10 MED ORDER — ENOXAPARIN SODIUM 40 MG/0.4ML IJ SOSY: 40.0000 mg | PREFILLED_SYRINGE | INTRAMUSCULAR | Status: DC

## 2023-06-10 MED ORDER — SPIRONOLACTONE 25 MG PO TABS
25.0000 mg | ORAL_TABLET | Freq: Every day | ORAL | Status: DC
  Administered 2023-06-10 – 2023-06-18 (×9): 25 mg via ORAL
  Filled 2023-06-10 (×9): qty 1

## 2023-06-10 MED ORDER — IPRATROPIUM-ALBUTEROL 0.5-2.5 (3) MG/3ML IN SOLN
3.0000 mL | Freq: Four times a day (QID) | RESPIRATORY_TRACT | Status: DC
  Administered 2023-06-11: 3 mL via RESPIRATORY_TRACT
  Filled 2023-06-10 (×2): qty 3

## 2023-06-10 NOTE — ED Notes (Signed)
ED TO INPATIENT HANDOFF REPORT  ED Nurse Name and Phone #: Gearlean Alf Name/Age/Gender Judy Baker 52 y.o. female Room/Bed: WOTF/NONE  Code Status   Code Status: Full Code  Home/SNF/Other Home Patient oriented to: self, place, time, and situation Is this baseline? Yes   Triage Complete: Triage complete  Chief Complaint COPD with acute exacerbation (HCC) [J44.1]  Triage Note Patient BIB EMS for chest tightness, cough, wheezing, and body aches x 5. Worse today. Hx of COPD. Given neb, solumedrol, zofran with EMS.    Allergies Allergies  Allergen Reactions   Molds & Smuts    Grass Pollen(K-O-R-T-Swt Vern)     Allscripts Description: Grass   Penicillins Hives and Swelling    Did it involve swelling of the face/tongue/throat, SOB, or low BP? Yes Did it involve sudden or severe rash/hives, skin peeling, or any reaction on the inside of your mouth or nose? Yes Did you need to seek medical attention at a hospital or doctor's office? Yes When did it last happen? childhood (Teenage) allergy  If all above answers are "NO", may proceed with cephalosporin use.    Sulfa Antibiotics Swelling    Level of Care/Admitting Diagnosis ED Disposition     ED Disposition  Admit   Condition  --   Comment  Hospital [facility]: [facility] [[id number]]  Level of Care: Med-Surg [16]  [facility] patient in [facility] at [facility] or [facility] if equivalent level of care is available:: No  Covid Evaluation: Confirmed COVID Negative  Diagnosis: COPD with acute exacerbation [facility]) [[id number]]  Admitting Physician: Maryln Gottron [[id number]]  Attending Physician: Kirby Crigler, MIR [email or web address] [[id number]]          B Medical/Surgery History Past Medical History:  Diagnosis Date   Arthritis    Asthma    Carpal tunnel syndrome    GERD (gastroesophageal reflux disease) 02/04/2019   HLD (hyperlipidemia) 02/04/2019   HTN (hypertension) 02/04/2019   Hypertension    Osteoarthritis    Past  Surgical History:  Procedure Laterality Date   BREAST BIOPSY Right 12/21/2018   [facility]   BREAST SURGERY     CARPAL TUNNEL RELEASE     CHOLECYSTECTOMY     LEFT HEART CATH AND CORONARY ANGIOGRAPHY N/A 03/30/2019   Procedure: LEFT HEART CATH AND CORONARY ANGIOGRAPHY;  Surgeon: Yates Decamp, MD;  Location: [facility] INVASIVE CV LAB;  Service: Cardiovascular;  Laterality: N/A;   LEFT HEART CATH AND CORONARY ANGIOGRAPHY N/A 02/21/2021   Procedure: LEFT HEART CATH AND CORONARY ANGIOGRAPHY;  Surgeon: Elder Negus, MD;  Location: [facility] INVASIVE CV LAB;  Service: Cardiovascular;  Laterality: N/A;   TUBAL LIGATION       A IV Location/Drains/Wounds Patient Lines/Drains/Airways Status     Active Line/Drains/Airways     Name Placement date Placement time Site Days   Peripheral IV 06/10/23 20 G Left;Posterior Hand 06/10/23  1104  Hand  less than 1            Intake/Output Last 24 hours No intake or output data in the 24 hours ending 06/10/23 1800  Labs/Imaging Results for orders placed or performed during the hospital encounter of 06/10/23 (from the past 48 hour(s))  CBC     Status: Abnormal   Collection Time: 06/10/23 11:25 AM  Result Value Ref Range   WBC 4.2 4.0 - 10.5 K/uL   RBC 4.99 3.87 - 5.11 MIL/uL   Hemoglobin 15.8 (H) 12.0 - 15.0 g/dL   HCT 29.5 (H) 62.1 -  46.0 %   MCV 96.4 80.0 - 100.0 fL   MCH 31.7 26.0 - 34.0 pg   MCHC 32.8 30.0 - 36.0 g/dL   RDW 65.7 84.6 - 96.2 %   Platelets 218 150 - 400 K/uL   nRBC 0.0 0.0 - 0.2 %    Comment: Performed at [facility], [address] [place], [place] [address]  Comprehensive metabolic panel     Status: Abnormal   Collection Time: 06/10/23 11:25 AM  Result Value Ref Range   Sodium 137 135 - 145 mmol/L   Potassium 3.1 (L) 3.5 - 5.1 mmol/L   Chloride 100 98 - 111 mmol/L   CO2 24 22 - 32 mmol/L   Glucose, Bld 103 (H) 70 - 99 mg/dL    Comment: Glucose reference range applies only to samples taken after  fasting for at least 8 hours.   BUN 11 6 - 20 mg/dL   Creatinine, Ser 1.32 0.44 - 1.00 mg/dL   Calcium 9.9 8.9 - 44.0 mg/dL   Total Protein 8.1 6.5 - 8.1 g/dL   Albumin 4.0 3.5 - 5.0 g/dL   AST 38 15 - 41 U/L   ALT 42 0 - 44 U/L   Alkaline Phosphatase 80 38 - 126 U/L   Total Bilirubin 0.5 <1.2 mg/dL   GFR, Estimated >10 >27 mL/min    Comment: (NOTE) Calculated using the CKD-EPI Creatinine Equation (2021)    Anion gap 13 5 - 15    Comment: Performed at [facility], [address], [place], [place] [address]  hCG, quantitative, pregnancy     Status: Abnormal   Collection Time: 06/10/23 11:25 AM  Result Value Ref Range   hCG, Beta Chain, Quant, S 5 (H) <5 mIU/mL    Comment:          GEST. AGE      CONC.  (mIU/mL)   <=1 WEEK        5 - 50     2 WEEKS       50 - 500     3 WEEKS       100 - 10,000     4 WEEKS     1,000 - 30,000     5 WEEKS     3,500 - 115,000   6-8 WEEKS     12,000 - 270,000    12 WEEKS     15,000 - 220,000        FEMALE AND NON-PREGNANT FEMALE:     LESS THAN 5 mIU/mL Performed at [facility], [address], [place], [place] [address]   Resp panel by RT-PCR (RSV, Flu A&B, Covid) Anterior Nasal Swab     Status: None   Collection Time: 06/10/23  2:26 PM   Specimen: Anterior Nasal Swab  Result Value Ref Range   SARS Coronavirus 2 by RT PCR NEGATIVE NEGATIVE    Comment: (NOTE) SARS-CoV-2 target nucleic acids are NOT DETECTED.  The SARS-CoV-2 RNA is generally detectable in upper respiratory specimens during the acute phase of infection. The lowest concentration of SARS-CoV-2 viral copies this assay can detect is 138 copies/mL. A negative result does not preclude SARS-Cov-2 infection and should not be used as the sole basis for treatment or other patient management decisions. A negative result may occur with  improper specimen collection/handling, submission of specimen other than nasopharyngeal swab, presence of viral  mutation(s) within the areas targeted by this assay, and inadequate number of  viral copies(<138 copies/mL). A negative result must be combined with clinical observations, patient history, and epidemiological information. The expected result is Negative.  Fact Sheet for Patients:  BloggerCourse.com  Fact Sheet for Healthcare Providers:  SeriousBroker.it  This test is no t yet approved or cleared by the [place] FDA and  has been authorized for detection and/or diagnosis of SARS-CoV-2 by FDA under an Emergency Use Authorization (EUA). This EUA will remain  in effect (meaning this test can be used) for the duration of the COVID-19 declaration under Section 564(b)(1) of the Act, 21 U.S.C.section 360bbb-3(b)(1), unless the authorization is terminated  or revoked sooner.       Influenza A by PCR NEGATIVE NEGATIVE   Influenza B by PCR NEGATIVE NEGATIVE    Comment: (NOTE) The Xpert Xpress SARS-CoV-2/FLU/RSV plus assay is intended as an aid in the diagnosis of influenza from Nasopharyngeal swab specimens and should not be used as a sole basis for treatment. Nasal washings and aspirates are unacceptable for Xpert Xpress SARS-CoV-2/FLU/RSV testing.  Fact Sheet for Patients: BloggerCourse.com  Fact Sheet for Healthcare Providers: SeriousBroker.it  This test is not yet approved or cleared by the [place] FDA and has been authorized for detection and/or diagnosis of SARS-CoV-2 by FDA under an Emergency Use Authorization (EUA). This EUA will remain in effect (meaning this test can be used) for the duration of the COVID-19 declaration under Section 564(b)(1) of the Act, 21 U.S.C. section 360bbb-3(b)(1), unless the authorization is terminated or revoked.     Resp Syncytial Virus by PCR NEGATIVE NEGATIVE    Comment: (NOTE) Fact Sheet for  Patients: BloggerCourse.com  Fact Sheet for Healthcare Providers: SeriousBroker.it  This test is not yet approved or cleared by the [place] FDA and has been authorized for detection and/or diagnosis of SARS-CoV-2 by FDA under an Emergency Use Authorization (EUA). This EUA will remain in effect (meaning this test can be used) for the duration of the COVID-19 declaration under Section 564(b)(1) of the Act, 21 U.S.C. section 360bbb-3(b)(1), unless the authorization is terminated or revoked.  Performed at [facility], [address], [place], [place] [address]   Blood gas, venous (at [facility] and [facility])     Status: Abnormal   Collection Time: 06/10/23  2:26 PM  Result Value Ref Range   pH, Ven 7.44 (H) 7.25 - 7.43   pCO2, Ven 41 (L) 44 - 60 mmHg   pO2, Ven 56 (H) 32 - 45 mmHg   Bicarbonate 27.8 20.0 - 28.0 mmol/L   Acid-Base Excess 3.3 (H) 0.0 - 2.0 mmol/L   O2 Saturation 87.4 %   Patient temperature 37.0     Comment: Performed at [facility], [address], [place], [place] [address]   DG Chest Portable 1 View  Result Date: 06/10/2023 CLINICAL DATA:  Shortness of breath with chest tightness, cough and wheezing x 5 EXAM: PORTABLE CHEST 1 VIEW COMPARISON:  Radiographs 05/13/2019 and 03/01/2019.  CT 10/12/2022. FINDINGS: 1340 hours. Two views are submitted. The heart size and mediastinal contours are stable. Mildly increased atelectasis at both lung bases. No confluent airspace disease, significant pleural effusion or pneumothorax. The bones appear unchanged. Telemetry leads overlie the chest. IMPRESSION: Mildly increased bibasilar atelectasis. No other acute cardiopulmonary process. Electronically Signed   By: Carey Bullocks M.D.   On: 06/10/2023 15:17    Pending Labs Unresulted Labs (From admission, onward)     Start     Ordered   06/11/23 0500  Basic metabolic panel  Tomorrow morning,   R         06/10/23 1633   06/11/23 0500  CBC  Tomorrow morning,   R        06/10/23 1633   06/10/23 1652  D-dimer, quantitative  ONCE - STAT,   STAT        06/10/23 1651   06/10/23 1633  HIV Antibody (routine testing w rflx)  (HIV Antibody (Routine testing w reflex) panel)  Once,   R        06/10/23 1633            Vitals/Pain Today's Vitals   06/10/23 1430 06/10/23 1555 06/10/23 1600 06/10/23 1712  BP: 139/82 (!) 137/90 (!) 149/82 (!) 150/72  Pulse: 85 90 91 89  Resp: 18 (!) 28 (!) 26 19  Temp:  97.9 F (36.6 C) 98.3 F (36.8 C)   TempSrc:  Oral Oral   SpO2: 90% 95% 96% 94%  Weight:      Height:        Isolation Precautions No active isolations  Medications Medications  nicotine (NICODERM CQ - dosed in mg/24 hr) patch 7 mg (has no administration in time range)  DULoxetine (CYMBALTA) DR capsule 60 mg (has no administration in time range)  traZODone (DESYREL) tablet 100 mg (has no administration in time range)  pantoprazole (PROTONIX) EC tablet 40 mg (has no administration in time range)  fluticasone (FLONASE) 50 MCG/ACT nasal spray 1 spray (has no administration in time range)  montelukast (SINGULAIR) tablet 10 mg (has no administration in time range)  acetaminophen (TYLENOL) tablet 650 mg (has no administration in time range)    Or  acetaminophen (TYLENOL) suppository 650 mg (has no administration in time range)  ondansetron (ZOFRAN) tablet 4 mg (has no administration in time range)    Or  ondansetron (ZOFRAN) injection 4 mg (has no administration in time range)  albuterol (PROVENTIL) (2.5 MG/3ML) 0.083% nebulizer solution 2.5 mg (has no administration in time range)  ipratropium-albuterol (DUONEB) 0.5-2.5 (3) MG/3ML nebulizer solution 3 mL (has no administration in time range)  methylPREDNISolone sodium succinate (SOLU-MEDROL) 40 mg/mL injection 40 mg (has no administration in time range)  enoxaparin (LOVENOX) injection 70 mg (has no administration in time range)   spironolactone (ALDACTONE) tablet 25 mg (has no administration in time range)  hydrochlorothiazide (HYDRODIURIL) tablet 25 mg (has no administration in time range)  labetalol (NORMODYNE) tablet 100 mg (has no administration in time range)  amLODipine (NORVASC) tablet 5 mg (has no administration in time range)  albuterol (PROVENTIL) (2.5 MG/3ML) 0.083% nebulizer solution 10 mg (10 mg Nebulization Given 06/10/23 1216)  ondansetron (ZOFRAN) injection 4 mg (4 mg Intravenous Given 06/10/23 1620)  potassium chloride SA (KLOR-CON M) CR tablet 40 mEq (40 mEq Oral Given 06/10/23 1627)    Mobility walks with device   Gilmer Mor)  Focused Assessments Pulmonary Assessment Handoff:  Lung sounds: Bilateral Breath Sounds: Expiratory wheezes O2 Device: Nasal Cannula O2 Flow Rate (L/min): 2 L/min    R Recommendations: See Admitting Provider Note  Report given to:   Additional Notes:

## 2023-06-10 NOTE — ED Triage Notes (Signed)
Patient BIB EMS for chest tightness, cough, wheezing, and body aches x 5. Worse today. Hx of COPD. Given neb, solumedrol, zofran with EMS.

## 2023-06-10 NOTE — ED Provider Notes (Signed)
[place] EMERGENCY DEPARTMENT AT [facility] Provider Note   CSN: [id number] Arrival date & time: 06/10/23  1056     History  Chief Complaint  Patient presents with   Cough   Shortness of Breath    Judy Baker is a 52 y.o. female.  Care discussed with Dr.   52 year old female history of COPD ongoing smoker presents today complaining of cough and wheezing with associated URI symptoms.  She reports runny nose some sore throat and productive cough.  She describes coughing up copious amounts of discolored mucus.  She has associated dyspnea but is not having any current chest pain.  She describes muscle aches and headaches.  Patient was transported via EMS and given albuterol nebulizer, Solu-Medrol and Zofran     Home Medications Prior to Admission medications   Medication Sig Start Date End Date Taking? Authorizing Provider  acetaminophen (TYLENOL) 500 MG tablet Take 2 tablets (1,000 mg total) by mouth every 6 (six) hours as needed. 10/12/22   Gailen Shelter, PA  albuterol (PROVENTIL) (2.5 MG/3ML) 0.083% nebulizer solution Inhale 3 mLs (2.5 mg total) into the lungs every 6 (six) hours as needed for wheezing or shortness of breath. 03/31/23 06/29/23  Suzan Slick, MD  albuterol (VENTOLIN HFA) 108 (90 Base) MCG/ACT inhaler Inhale 2 puffs into the lungs every 6 (six) hours as needed for wheezing or shortness of breath.    [provider]  amLODipine (NORVASC) 10 MG tablet Take 1 tablet (10 mg total) by mouth daily. Patient taking differently: Take 10 mg by mouth every evening. 07/20/19 11/26/21  Toniann Fail, NP  azelastine (ASTELIN) 0.1 % nasal spray Place 2 sprays into both nostrils 2 (two) times daily as needed (allergies).  12/21/18   [provider]  budesonide-formoterol (SYMBICORT) 160-4.5 MCG/ACT inhaler Inhale 2 puffs into the lungs in the morning and at bedtime.    [provider]  busPIRone (BUSPAR) 10 MG tablet Take 1  tablet (10 mg total) by mouth 2 (two) times daily. 03/13/23   Suzan Slick, MD  DULoxetine (CYMBALTA) 60 MG capsule TAKE 1 CAPSULE BY MOUTH EVERY DAY 04/22/23   Suzan Slick, MD  fluticasone (FLONASE) 50 MCG/ACT nasal spray Place 1 spray into both nostrils 2 (two) times daily as needed for allergies.  12/21/18   [provider]  furosemide (LASIX) 20 MG tablet Take 20 mg by mouth daily as needed for fluid.    [provider]  guaiFENesin (MUCINEX) 600 MG 12 hr tablet Take 2 tablets (1,200 mg total) by mouth 2 (two) times daily as needed. 04/03/23   Suzan Slick, MD  labetalol (NORMODYNE) 100 MG tablet TAKE 1 TABLET TWICE A DAY (HOLD IF TOP BLOOD PRESSURE NUMBER IS LESS THAN 100 OR PULSE LESS THAN 60) 10/14/22   Tolia, Sunit, DO  levonorgestrel (MIRENA) 20 MCG/DAY IUD by Intrauterine route. 05/10/20 05/02/27  [provider]  montelukast (SINGULAIR) 10 MG tablet Take 10 mg by mouth at bedtime.    [provider]  Multiple Vitamins-Minerals (CENTRUM SILVER 50+WOMEN) TABS Take 1 tablet by mouth daily.    [provider]  nicotine (NICODERM CQ - DOSED IN MG/24 HR) 7 mg/24hr patch Place 1 patch (7 mg total) onto the skin daily. 03/13/23   Suzan Slick, MD  nitroGLYCERIN (NITROSTAT) 0.4 MG SL tablet PLACE 1 TABLET UNDER THE TONGUE EVERY 5 MINUTES AS NEEDED FOR CHEST PAIN. 03/07/22   Tolia, Sunit, DO  omeprazole (PRILOSEC)  40 MG capsule Take 1 capsule (40 mg total) by mouth daily. 03/13/23   Suzan Slick, MD  pravastatin (PRAVACHOL) 40 MG tablet Take 1 tablet (40 mg total) by mouth daily. 04/17/23   Suzan Slick, MD  promethazine-dextromethorphan (PROMETHAZINE-DM) 6.25-15 MG/5ML syrup Take 5 mLs by mouth at bedtime as needed for cough. Patient not taking: Reported on 04/17/2023 03/31/23   Suzan Slick, MD  spironolactone-hydrochlorothiazide (ALDACTAZIDE) 25-25 MG tablet Take 1 tablet by mouth daily. Restarted on 01/31/20 01/24/20   [provider]  traZODone (DESYREL) 100 MG tablet Take 1 tablet (100 mg total) by mouth at bedtime. 03/13/23   Suzan Slick, MD  zolpidem (AMBIEN) 10 MG tablet Take 10 mg by mouth at bedtime as needed for sleep. 09/19/20   [provider]      Allergies    Molds & smuts, Grass pollen([id number]-[id number]-swt vern), Penicillins, and Sulfa antibiotics    Review of Systems   Review of Systems  Physical Exam Updated Vital Signs BP (!) 149/82   Pulse 91   Temp 98.3 F (36.8 C) (Oral)   Resp (!) 26   Ht 1.702 m (5\' 7" )   Wt (!) 145.2 kg   SpO2 96%   BMI 50.12 kg/m  Physical Exam Vitals and nursing note reviewed.  Constitutional:      General: She is in acute distress.     Appearance: She is well-developed. She is obese.  HENT:     Head: Normocephalic.     Mouth/Throat:     Mouth: Mucous membranes are moist.  Eyes:     Pupils: Pupils are equal, round, and reactive to light.  Cardiovascular:     Rate and Rhythm: Normal rate and regular rhythm.  Pulmonary:     Effort: Tachypnea and respiratory distress present.     Breath sounds: Examination of the right-upper field reveals wheezing. Examination of the left-upper field reveals wheezing. Examination of the right-middle field reveals wheezing. Examination of the left-middle field reveals wheezing. Examination of the right-lower field reveals wheezing. Examination of the left-lower field reveals wheezing. Wheezing present.  Chest:     Chest wall: No deformity or crepitus.  Abdominal:     Palpations: Abdomen is soft.  Musculoskeletal:        General: Normal range of motion.     Cervical back: Normal range of motion.     Right lower leg: No edema.     Left lower leg: No edema.  Skin:    General: Skin is warm and dry.     Capillary Refill: Capillary refill takes less than 2 seconds.  Neurological:     General: No focal deficit present.     Mental Status: She is alert.  Psychiatric:        Mood and Affect: Mood normal.      ED Results / Procedures / Treatments   Labs (all labs ordered are listed, but only abnormal results are displayed) Labs Reviewed  CBC - Abnormal; Notable for the following components:      Result Value   Hemoglobin 15.8 (*)    HCT 48.1 (*)    All other components within normal limits  COMPREHENSIVE METABOLIC PANEL - Abnormal; Notable for the following components:   Potassium 3.1 (*)    Glucose, Bld 103 (*)    All other components within normal limits  HCG, QUANTITATIVE, PREGNANCY - Abnormal; Notable for the following components:   hCG, Beta Chain, Quant, S 5 (*)  All other components within normal limits  BLOOD GAS, VENOUS - Abnormal; Notable for the following components:   pH, Ven 7.44 (*)    pCO2, Ven 41 (*)    pO2, Ven 56 (*)    Acid-Base Excess 3.3 (*)    All other components within normal limits  RESP PANEL BY RT-PCR (RSV, FLU A&B, COVID)  [id number]    EKG None  Radiology DG Chest Portable 1 View  Result Date: 06/10/2023 CLINICAL DATA:  Shortness of breath with chest tightness, cough and wheezing x 5 EXAM: PORTABLE CHEST 1 VIEW COMPARISON:  Radiographs 05/13/2019 and 03/01/2019.  CT 10/12/2022. FINDINGS: 1340 hours. Two views are submitted. The heart size and mediastinal contours are stable. Mildly increased atelectasis at both lung bases. No confluent airspace disease, significant pleural effusion or pneumothorax. The bones appear unchanged. Telemetry leads overlie the chest. IMPRESSION: Mildly increased bibasilar atelectasis. No other acute cardiopulmonary process. Electronically Signed   By: Carey Bullocks M.D.   On: 06/10/2023 15:17    Procedures .Critical Care  Performed by: Margarita Grizzle, MD Authorized by: Margarita Grizzle, MD   Critical care provider statement:    Critical care time (minutes):  30   Critical care end time:  06/10/2023 4:33 PM   Critical care was necessary to treat or prevent imminent or life-threatening deterioration of the following  conditions:  Respiratory failure   Critical care was time spent personally by me on the following activities:  Development of treatment plan with patient or surrogate, discussions with consultants, evaluation of patient's response to treatment, examination of patient, ordering and review of laboratory studies, ordering and review of radiographic studies, ordering and performing treatments and interventions, pulse oximetry, re-evaluation of patient's condition and review of old charts     Medications Ordered in ED Medications  albuterol (PROVENTIL) (2.5 MG/3ML) 0.083% nebulizer solution 10 mg (10 mg Nebulization Given 06/10/23 1216)  ondansetron (ZOFRAN) injection 4 mg (4 mg Intravenous Given 06/10/23 1620)  potassium chloride SA (KLOR-CON M) CR tablet 40 mEq (40 mEq Oral Given 06/10/23 1627)    ED Course/ Medical Decision Making/ A&P Clinical Course as of 06/10/23 1633  Tue Jun 10, 2023  1543 Chest x-Nithin Demeo reviewed interpreted no evidence of acute abnormality noted radiologist interpretation notes mildly increased bibasilar atelectasis no other acute cardiopulmonary process noted [DR]  1543 COVID, flu, RSV all negative CBC with normal white count no evidence of severe anemia Complete metabolic panel significant for mild hypokalemia potassium 3.1 Venous [DR]  1544  blood gas pH 7.44 pCO2 41 pO2 of 56 [DR]    Clinical Course User Index [DR] Margarita Grizzle, MD                                 Medical Decision Making Amount and/or Complexity of Data Reviewed Labs: ordered. Radiology: ordered.  Risk Prescription drug management.   52 year old female history of COPD, smoker, presents today complaining of cough, ingestion, and dyspnea with diffuse wheezing Differential diagnosis includes but is not limited to exacerbation of COPD, other viral syndrome, pneumonia, volume overload, coronary syndrome, Patient treated prehospital with solumedrol nebs Sats 88% now 94 on 2 l.m Nebs given in ED  with continued wheezing Patient with some improvement but does continue to wheeze Plan continue oxygen, will discuss with hospitalist for admission Care discussed with Dr.Ikram who will see for evaluationr.        Final Clinical Impression(s) / ED Diagnoses  Final diagnoses:  None    Rx / DC Orders ED Discharge Orders     None         Margarita Grizzle, MD 06/10/23 [phone]

## 2023-06-10 NOTE — H&P (Signed)
History and Physical  Judy Baker UVO:[id number] DOB: 1970-10-19 DOA: 06/10/2023  PCP: Suzan Slick, MD   Chief Complaint: cough, shortness of breath   HPI: Judy Baker is a 52 y.o. female with medical history significant for obesity, hypertension, hyperlipidemia, GERD, tobacco abuse as well as asthma being admitted to the hospital with suspected exacerbation of COPD.  Patient states she has been having some congestion, cough productive of gray sputum, and chest tightness for the last 5 or 6 days.  Denies fevers, or pleuritic chest pain.  She does not wear oxygen at baseline.  She was transferred to the ER via EMS, she was given albuterol nebulizer, Solu-Medrol and Zofran en route.  Here, she has been placed on 2 L nasal cannula oxygen, states that she feels only slightly better than when she first arrived.  The patient was noted to desaturate into the mid 80s on room air.  Lab work relatively unremarkable, as below.  She was given oral potassium supplementation, and hospitalist contacted for admission.  Review of Systems: Please see HPI for pertinent positives and negatives. A complete 10 system review of systems are otherwise negative.  Past Medical History:  Diagnosis Date   Arthritis    Asthma    Carpal tunnel syndrome    GERD (gastroesophageal reflux disease) 02/04/2019   HLD (hyperlipidemia) 02/04/2019   HTN (hypertension) 02/04/2019   Hypertension    Osteoarthritis    Past Surgical History:  Procedure Laterality Date   BREAST BIOPSY Right 12/21/2018   [facility]   BREAST SURGERY     CARPAL TUNNEL RELEASE     CHOLECYSTECTOMY     LEFT HEART CATH AND CORONARY ANGIOGRAPHY N/A 03/30/2019   Procedure: LEFT HEART CATH AND CORONARY ANGIOGRAPHY;  Surgeon: Yates Decamp, MD;  Location: [facility] INVASIVE CV LAB;  Service: Cardiovascular;  Laterality: N/A;   LEFT HEART CATH AND CORONARY ANGIOGRAPHY N/A 02/21/2021   Procedure: LEFT HEART CATH AND CORONARY ANGIOGRAPHY;  Surgeon: Elder Negus, MD;  Location: [facility] INVASIVE CV LAB;  Service: Cardiovascular;  Laterality: N/A;   TUBAL LIGATION      Social History:  reports that she has been smoking cigarettes. She has a 5 pack-year smoking history. She has been exposed to tobacco smoke. She has never used smokeless tobacco. She reports current alcohol use. She reports that she does not use drugs.   Allergies  Allergen Reactions   Molds & Smuts    Grass Pollen(K-O-R-T-Swt Vern)     [employer] Description: Grass   Penicillins Hives and Swelling    Did it involve swelling of the face/tongue/throat, SOB, or low BP? Yes Did it involve sudden or severe rash/hives, skin peeling, or any reaction on the inside of your mouth or nose? Yes Did you need to seek medical attention at a hospital or doctor's office? Yes When did it last happen? childhood (Teenage) allergy  If all above answers are "NO", may proceed with cephalosporin use.    Sulfa Antibiotics Swelling    Family History  Problem Relation Age of Onset   Aneurysm Mother    Valvular heart disease Mother    Stroke Father    Heart murmur Sister    Cardiomyopathy Brother      Prior to Admission medications   Medication Sig Start Date End Date Taking? Authorizing Provider  acetaminophen (TYLENOL) 500 MG tablet Take 2 tablets (1,000 mg total) by mouth every 6 (six) hours as needed. 10/12/22   Gailen Shelter, PA  albuterol (PROVENTIL) (2.5  MG/3ML) 0.083% nebulizer solution Inhale 3 mLs (2.5 mg total) into the lungs every 6 (six) hours as needed for wheezing or shortness of breath. 03/31/23 06/29/23  Suzan Slick, MD  albuterol (VENTOLIN HFA) 108 (90 Base) MCG/ACT inhaler Inhale 2 puffs into the lungs every 6 (six) hours as needed for wheezing or shortness of breath.    [provider]  amLODipine (NORVASC) 10 MG tablet Take 1 tablet (10 mg total) by mouth daily. Patient taking differently: Take 10 mg by mouth every evening. 07/20/19 11/26/21  Toniann Fail, NP  azelastine (ASTELIN) 0.1 % nasal spray Place 2 sprays into both nostrils 2 (two) times daily as needed (allergies).  12/21/18   [provider]  budesonide-formoterol (SYMBICORT) 160-4.5 MCG/ACT inhaler Inhale 2 puffs into the lungs in the morning and at bedtime.    [provider]  busPIRone (BUSPAR) 10 MG tablet Take 1 tablet (10 mg total) by mouth 2 (two) times daily. 03/13/23   Suzan Slick, MD  DULoxetine (CYMBALTA) 60 MG capsule TAKE 1 CAPSULE BY MOUTH EVERY DAY 04/22/23   Suzan Slick, MD  fluticasone (FLONASE) 50 MCG/ACT nasal spray Place 1 spray into both nostrils 2 (two) times daily as needed for allergies.  12/21/18   [provider]  furosemide (LASIX) 20 MG tablet Take 20 mg by mouth daily as needed for fluid.    [provider]  guaiFENesin (MUCINEX) 600 MG 12 hr tablet Take 2 tablets (1,200 mg total) by mouth 2 (two) times daily as needed. 04/03/23   Suzan Slick, MD  labetalol (NORMODYNE) 100 MG tablet TAKE 1 TABLET TWICE A DAY (HOLD IF TOP BLOOD PRESSURE NUMBER IS LESS THAN 100 OR PULSE LESS THAN 60) 10/14/22   Tolia, Sunit, DO  levonorgestrel (MIRENA) 20 MCG/DAY IUD by Intrauterine route. 05/10/20 05/02/27  [provider]  montelukast (SINGULAIR) 10 MG tablet Take 10 mg by mouth at bedtime.    [provider]  Multiple Vitamins-Minerals (CENTRUM SILVER 50+WOMEN) TABS Take 1 tablet by mouth daily.    [provider]  nicotine (NICODERM CQ - DOSED IN MG/24 HR) 7 mg/24hr patch Place 1 patch (7 mg total) onto the skin daily. 03/13/23   Rucker, Magdalen Spatz, MD  nitroGLYCERIN (NITROSTAT) 0.4 MG SL tablet PLACE 1 TABLET UNDER THE TONGUE EVERY 5 MINUTES AS NEEDED FOR CHEST PAIN. 03/07/22   Tolia, Sunit, DO  omeprazole (PRILOSEC) 40 MG capsule Take 1 capsule (40 mg total) by mouth daily. 03/13/23   Suzan Slick, MD  pravastatin (PRAVACHOL) 40 MG tablet Take 1 tablet (40 mg total) by mouth daily. 04/17/23    Suzan Slick, MD  promethazine-dextromethorphan (PROMETHAZINE-DM) 6.25-15 MG/5ML syrup Take 5 mLs by mouth at bedtime as needed for cough. Patient not taking: Reported on 04/17/2023 03/31/23   Suzan Slick, MD  spironolactone-hydrochlorothiazide (ALDACTAZIDE) 25-25 MG tablet Take 1 tablet by mouth daily. Restarted on 01/31/20 01/24/20   [provider]  traZODone (DESYREL) 100 MG tablet Take 1 tablet (100 mg total) by mouth at bedtime. 03/13/23   Suzan Slick, MD  zolpidem (AMBIEN) 10 MG tablet Take 10 mg by mouth at bedtime as needed for sleep. 09/19/20   [provider]    Physical Exam: BP (!) 149/82   Pulse 91   Temp 98.3 F (36.8 C) (Oral)   Resp (!) 26   Ht 5\' 7"  (1.702 m)   Wt (!) 145.2 kg   SpO2 96%  BMI 50.12 kg/m   General:  Alert, oriented, calm, in no acute distress, slightly dyspneic with speech.  Wearing 2 L nasal cannula oxygen.  No cough. Cardiovascular: RRR, no murmurs or rubs, no peripheral edema  Respiratory: Breath sounds are distant but equal bilaterally, no tachypnea, retractions or respiratory distress.  Some mild rhonchi, no frank wheezing. Abdomen: soft, nontender, nondistended, normal bowel tones heard  Skin: dry, no rashes  Musculoskeletal: no joint effusions, normal range of motion  Psychiatric: appropriate affect, normal speech  Neurologic: extraocular muscles intact, clear speech, moving all extremities with intact sensorium         Labs on Admission:  Basic Metabolic Panel: Recent Labs  Lab 06/10/23 1125  NA 137  K 3.1*  CL 100  CO2 24  GLUCOSE 103*  BUN 11  CREATININE 0.82  CALCIUM 9.9   Liver Function Tests: Recent Labs  Lab 06/10/23 1125  AST 38  ALT 42  ALKPHOS 80  BILITOT 0.5  PROT 8.1  ALBUMIN 4.0   No results for input(s): "LIPASE", "AMYLASE" in the last 168 hours. No results for input(s): "AMMONIA" in the last 168 hours. CBC: Recent Labs  Lab 06/10/23 1125  WBC 4.2  HGB 15.8*  HCT 48.1*   MCV 96.4  PLT 218   Cardiac Enzymes: No results for input(s): "CKTOTAL", "CKMB", "CKMBINDEX", "TROPONINI" in the last 168 hours.  BNP (last 3 results) No results for input(s): "BNP" in the last 8760 hours.  ProBNP (last 3 results) No results for input(s): "PROBNP" in the last 8760 hours.  CBG: No results for input(s): "GLUCAP" in the last 168 hours.  Radiological Exams on Admission: DG Chest Portable 1 View  Result Date: 06/10/2023 CLINICAL DATA:  Shortness of breath with chest tightness, cough and wheezing x 5 EXAM: PORTABLE CHEST 1 VIEW COMPARISON:  Radiographs 05/13/2019 and 03/01/2019.  CT 10/12/2022. FINDINGS: 1340 hours. Two views are submitted. The heart size and mediastinal contours are stable. Mildly increased atelectasis at both lung bases. No confluent airspace disease, significant pleural effusion or pneumothorax. The bones appear unchanged. Telemetry leads overlie the chest. IMPRESSION: Mildly increased bibasilar atelectasis. No other acute cardiopulmonary process. Electronically Signed   By: Carey Bullocks M.D.   On: 06/10/2023 15:17    Assessment/Plan Judy Baker is a 52 y.o. female with medical history significant for obesity, hypertension, hyperlipidemia, GERD, tobacco abuse as well as asthma being admitted to the hospital with suspected exacerbation of COPD.  Acute exacerbation of COPD-in the setting of ongoing tobacco abuse, increased cough, chest tightness, and wheezing.  No clear inciting factor, respiratory virus panel is negative. -Observation admission -Supplemental oxygen to keep O2 saturation greater than 90% -Scheduled DuoNeb, with as needed albuterol inhaler -IV Solu-Medrol 40 mg twice daily  Acute hypoxic respiratory failure-likely due to COPD exacerbation, though she does not have respiratory acidosis.  I have a low suspicion for PE, but will obtain D-dimer to rule it out.  Tobacco abuse-nicotine patch  GERD-Protonix p.o. daily  Hypertension-was  previously on amlodipine and spironolactone-hydrochlorothiazide, though medication list has not been updated.  Will resume home medications once reconciled by pharmacy  Hyperlipidemia-continue Pravachol  Depression-Cymbalta  DVT prophylaxis: Lovenox     Code Status: Full Code  Consults called: None  Admission status: Observation  Time spent: 59 minutes  Torian Quintero Sharlette Dense MD [facility] Pager [phone]  If 7PM-7AM, please contact night-coverage [email or web address] Password [facility]  06/10/2023, 4:34 PM

## 2023-06-10 NOTE — ED Provider Triage Note (Signed)
Emergency Medicine Provider Triage Evaluation Note  Judy Baker , a 52 y.o. female  was evaluated in triage.  Pt complains of cough, wheezing, URI symptoms.  Review of Systems  Positive: URI symptoms for several days, productive cough subjective fever not normally on any oxygen smoker Negative: Chest pain  Physical Exam  BP 138/80 (BP Location: Left Arm)   Pulse 79   Temp 98.7 F (37.1 C) (Oral)   Resp 15   Ht 1.702 m (5\' 7" )   Wt (!) 145.2 kg   SpO2 92%   BMI 50.12 kg/m  Gen:   Awake, no distress appears somewhat uncomfortable Resp:  Increased work of breathing, increased respiratory rate, diffuse expiratory wheezes MSK:   Moves extremities without difficulty  Other:  Sitting up in bed able to speak in full sentences  Medical Decision Making  Medically screening exam initiated at 11:27 AM.  Appropriate orders placed.  Judy Baker was informed that the remainder of the evaluation will be completed by another provider, this initial triage assessment does not replace that evaluation, and the importance of remaining in the ED until their evaluation is complete.  Plan chest x-Judy Baker, Solu-Medrol, nebulizer, VBG, other usual labs.   Margarita Grizzle, MD 06/10/23 [phone]

## 2023-06-10 NOTE — Telephone Encounter (Signed)
Spoke with patient and informed her of PCP recommendation to go to ER for evaluation. Patient gave verbal understanding

## 2023-06-11 ENCOUNTER — Telehealth: Payer: Self-pay | Admitting: Family Medicine

## 2023-06-11 DIAGNOSIS — F32A Depression, unspecified: Secondary | ICD-10-CM | POA: Diagnosis present

## 2023-06-11 DIAGNOSIS — E876 Hypokalemia: Secondary | ICD-10-CM

## 2023-06-11 DIAGNOSIS — K219 Gastro-esophageal reflux disease without esophagitis: Secondary | ICD-10-CM | POA: Diagnosis present

## 2023-06-11 DIAGNOSIS — G4733 Obstructive sleep apnea (adult) (pediatric): Secondary | ICD-10-CM | POA: Diagnosis present

## 2023-06-11 DIAGNOSIS — D72829 Elevated white blood cell count, unspecified: Secondary | ICD-10-CM | POA: Diagnosis not present

## 2023-06-11 DIAGNOSIS — R509 Fever, unspecified: Secondary | ICD-10-CM | POA: Diagnosis present

## 2023-06-11 DIAGNOSIS — Z1152 Encounter for screening for COVID-19: Secondary | ICD-10-CM | POA: Diagnosis not present

## 2023-06-11 DIAGNOSIS — J440 Chronic obstructive pulmonary disease with acute lower respiratory infection: Secondary | ICD-10-CM | POA: Diagnosis present

## 2023-06-11 DIAGNOSIS — F17210 Nicotine dependence, cigarettes, uncomplicated: Secondary | ICD-10-CM | POA: Diagnosis present

## 2023-06-11 DIAGNOSIS — Z880 Allergy status to penicillin: Secondary | ICD-10-CM | POA: Diagnosis not present

## 2023-06-11 DIAGNOSIS — J209 Acute bronchitis, unspecified: Secondary | ICD-10-CM | POA: Diagnosis present

## 2023-06-11 DIAGNOSIS — Z882 Allergy status to sulfonamides status: Secondary | ICD-10-CM | POA: Diagnosis not present

## 2023-06-11 DIAGNOSIS — T380X5A Adverse effect of glucocorticoids and synthetic analogues, initial encounter: Secondary | ICD-10-CM | POA: Diagnosis not present

## 2023-06-11 DIAGNOSIS — E8809 Other disorders of plasma-protein metabolism, not elsewhere classified: Secondary | ICD-10-CM | POA: Diagnosis not present

## 2023-06-11 DIAGNOSIS — Z79899 Other long term (current) drug therapy: Secondary | ICD-10-CM | POA: Diagnosis not present

## 2023-06-11 DIAGNOSIS — J9601 Acute respiratory failure with hypoxia: Secondary | ICD-10-CM

## 2023-06-11 DIAGNOSIS — Z7951 Long term (current) use of inhaled steroids: Secondary | ICD-10-CM | POA: Diagnosis not present

## 2023-06-11 DIAGNOSIS — I10 Essential (primary) hypertension: Secondary | ICD-10-CM | POA: Diagnosis present

## 2023-06-11 DIAGNOSIS — J441 Chronic obstructive pulmonary disease with (acute) exacerbation: Secondary | ICD-10-CM | POA: Diagnosis present

## 2023-06-11 DIAGNOSIS — M1990 Unspecified osteoarthritis, unspecified site: Secondary | ICD-10-CM | POA: Diagnosis present

## 2023-06-11 DIAGNOSIS — E785 Hyperlipidemia, unspecified: Secondary | ICD-10-CM | POA: Diagnosis present

## 2023-06-11 DIAGNOSIS — Z6841 Body Mass Index (BMI) 40.0 and over, adult: Secondary | ICD-10-CM | POA: Diagnosis not present

## 2023-06-11 DIAGNOSIS — B370 Candidal stomatitis: Secondary | ICD-10-CM | POA: Diagnosis not present

## 2023-06-11 DIAGNOSIS — Z8249 Family history of ischemic heart disease and other diseases of the circulatory system: Secondary | ICD-10-CM | POA: Diagnosis not present

## 2023-06-11 DIAGNOSIS — R0609 Other forms of dyspnea: Secondary | ICD-10-CM | POA: Diagnosis not present

## 2023-06-11 DIAGNOSIS — E871 Hypo-osmolality and hyponatremia: Secondary | ICD-10-CM | POA: Diagnosis not present

## 2023-06-11 LAB — BASIC METABOLIC PANEL
Anion gap: 12 (ref 5–15)
BUN: 19 mg/dL (ref 6–20)
CO2: 25 mmol/L (ref 22–32)
Calcium: 9.9 mg/dL (ref 8.9–10.3)
Chloride: 97 mmol/L — ABNORMAL LOW (ref 98–111)
Creatinine, Ser: 0.79 mg/dL (ref 0.44–1.00)
GFR, Estimated: >60 mL/min (ref >60)
Glucose, Bld: 141 mg/dL — ABNORMAL HIGH (ref 70–99)
Potassium: 3.4 mmol/L — ABNORMAL LOW (ref 3.5–5.1)
Sodium: 134 mmol/L — ABNORMAL LOW (ref 135–145)

## 2023-06-11 LAB — CBC
HCT: 46.9 % — ABNORMAL HIGH (ref 36.0–46.0)
Hemoglobin: 15.6 g/dL — ABNORMAL HIGH (ref 12.0–15.0)
MCH: 31.6 pg (ref 26.0–34.0)
MCHC: 33.3 g/dL (ref 30.0–36.0)
MCV: 95.1 fL (ref 80.0–100.0)
Platelets: 210 10*3/uL (ref 150–400)
RBC: 4.93 MIL/uL (ref 3.87–5.11)
RDW: 13.1 % (ref 11.5–15.5)
WBC: 4.5 10*3/uL (ref 4.0–10.5)
nRBC: 0 % (ref 0.0–0.2)

## 2023-06-11 MED ORDER — LEVALBUTEROL HCL 0.63 MG/3ML IN NEBU
0.6300 mg | INHALATION_SOLUTION | Freq: Four times a day (QID) | RESPIRATORY_TRACT | Status: DC
Start: 2023-06-11 — End: 2023-06-12
  Administered 2023-06-11 – 2023-06-12 (×3): 0.63 mg via RESPIRATORY_TRACT
  Filled 2023-06-11 (×3): qty 3

## 2023-06-11 MED ORDER — GUAIFENESIN ER 600 MG PO TB12
1200.0000 mg | ORAL_TABLET | Freq: Two times a day (BID) | ORAL | Status: DC
  Administered 2023-06-11 – 2023-06-18 (×15): 1200 mg via ORAL
  Filled 2023-06-11 (×15): qty 2

## 2023-06-11 MED ORDER — ARFORMOTEROL TARTRATE 15 MCG/2ML IN NEBU
15.0000 ug | INHALATION_SOLUTION | Freq: Two times a day (BID) | RESPIRATORY_TRACT | Status: DC
  Administered 2023-06-11 – 2023-06-17 (×11): 15 ug via RESPIRATORY_TRACT
  Filled 2023-06-11 (×15): qty 2

## 2023-06-11 MED ORDER — IPRATROPIUM BROMIDE 0.02 % IN SOLN
0.5000 mg | Freq: Four times a day (QID) | RESPIRATORY_TRACT | Status: DC
Start: 2023-06-11 — End: 2023-06-12
  Administered 2023-06-11 – 2023-06-12 (×3): 0.5 mg via RESPIRATORY_TRACT
  Filled 2023-06-11 (×3): qty 2.5

## 2023-06-11 MED ORDER — DOXYCYCLINE HYCLATE 100 MG PO TABS
100.0000 mg | ORAL_TABLET | Freq: Two times a day (BID) | ORAL | Status: DC
  Administered 2023-06-11 – 2023-06-15 (×9): 100 mg via ORAL
  Filled 2023-06-11 (×10): qty 1

## 2023-06-11 MED ORDER — PRAVASTATIN SODIUM 40 MG PO TABS
40.0000 mg | ORAL_TABLET | Freq: Every day | ORAL | Status: DC
  Administered 2023-06-11 – 2023-06-18 (×8): 40 mg via ORAL
  Filled 2023-06-11 (×8): qty 1

## 2023-06-11 MED ORDER — BUDESONIDE 0.25 MG/2ML IN SUSP
0.2500 mg | Freq: Two times a day (BID) | RESPIRATORY_TRACT | Status: DC
  Administered 2023-06-11 – 2023-06-14 (×7): 0.25 mg via RESPIRATORY_TRACT
  Filled 2023-06-11 (×8): qty 2

## 2023-06-11 MED ORDER — POTASSIUM CHLORIDE CRYS ER 20 MEQ PO TBCR
40.0000 meq | EXTENDED_RELEASE_TABLET | Freq: Once | ORAL | Status: AC
  Administered 2023-06-11: 40 meq via ORAL
  Filled 2023-06-11: qty 2

## 2023-06-11 MED ORDER — METHYLPREDNISOLONE SODIUM SUCC 125 MG IJ SOLR
60.0000 mg | Freq: Two times a day (BID) | INTRAMUSCULAR | Status: DC
Start: 2023-06-11 — End: 2023-06-13
  Administered 2023-06-11 – 2023-06-13 (×4): 60 mg via INTRAVENOUS
  Filled 2023-06-11 (×4): qty 2

## 2023-06-11 NOTE — Hospital Course (Addendum)
The patient is a 52 year old African-American female with a past medical history significant for vomiting hypertension, hyperlipidemia, GERD, tobacco abuse, obesity as well as other comorbidities including asthma who was admitted to the hospital with suspected exacerbation of acute COPD with acute respiratory failure with hypoxia.  She presented with a cough and shortness of breath that been getting worse with some congestion.  Cough was productive with gray sputum and she had chest tightness for the last 5 to 6 days.  She does not wear oxygen at baseline and she was transferred to the ED and given nebulized inhaler, Solu-Medrol, Zofran.  She is also placed on 2 L supplemental oxygen nasal cannula and she was noted to desaturate in the mid 80s on room air.  Currently she still feels wheezy and tight in her chest and very Dyspenic on Exertion.   Given CXR findings will start trial of Diuresis and obtain ECHOCardiogram.  Give her another dose of diuresis this morning but given her lack of improvement we will obtain a CT scan of the chest with contrast and obtain pulmonary consultation.  Echocardiogram is done but pending read.   Assessment and Plan:  Acute Exacerbation of COPD -In the setting of ongoing tobacco abuse, increased cough, chest tightness, and wheezing.  No clear inciting factor, respiratory virus panel is negative. -Observation admission changed to Inpatient  -Supplemental oxygen to keep O2 saturation greater than 90% -Change DuoNebs scheduled to Xopenex and Atrovent scheduled and add budesonide Brovana -IV Solu-Medrol 40 mg twice daily increased to 60 mg twice daily yesterday and will continue at current dose ABG    Component Value Date/Time   HCO3 27.8 06/10/2023 1426   TCO2 31 10/11/2022 2143   O2SAT 87.4 06/10/2023 1426  -Treatment as below -Will add Doxycycline 100 mg po BID -Respiratory Virus Panel Negative -See below for further treatment  Acute Respiratory Failure with  Hypoxia -Likely due to COPD exacerbation, though she does not have respiratory acidosis. ? Component of CHF  -Added budesonide 0.25 mg nebs twice daily as well as arformoterol 15 mcg nebs twice daily -Continue with Xopenex and Atrovent every 6 scheduled and increase Solu-Medrol dose to 60 mg every 12 -Continue with flutter valve, incentive spirometry and guaifenesin 1200 mL p.o. twice daily -Low suspicion for PE, but will obtain D-dimer to rule it out and this was less than 0.27 SpO2: 97 % O2 Flow Rate (L/min): 2 L/min -Checked respiratory virus panel and was negative as well as influenza A/B, RSV and SARS-CoV-2 were negative -Check BNP and was 72.3 but not as reliable in obese  -Check ECHOCardiogram and still pending to be done -Trial of Lasix and will give her IV 40 mg x1 again (received a dose yesterday) CXR findings yesterday showing "Constellation of findings are favored to reflect pulmonary edema." -Continuous supplemental oxygen via nasal cannula wean O2 as tolerated -Continuous pulse oximetry maintain O2 saturations greater than 90% -Will need an ambulatory home O2 screen and repeat chest x-ray in the a.m. -Given her lack of improvement and significant dyspnea we will check a CT scan of the chest with contrast which showed "There are a few subtle areas of peripheral ground-glass opacity within the right upper lobe and to a lesser degree within the periphery of the left upper lobe. Findings are nonspecific but may represent a mild infectious or inflammatory process. Mild subsegmental atelectasis within the lingula and medial right lung base." -Pulmonary consulted for further evaluation and recommendations and they are recommending continue supplemental oxygen,  increasing her PPI and adding a nasal H2 blocker and also adding low-dose Tussionex to assist with cough as well as reflux precaution   Tobacco Abuse -Smoking cessation counseling given -Continue with nicotine patch 7 mg  Transdermally every 24   Hypertension -Currently on Amlodipine 5 mg po qHS and will continue Spirolactone 25 mg po Daily and will hold hydrochlorothiazide 25 mg po Daily  -Continue to monitor blood pressures per protocol -Continue with labetalol 100 mg p.o. twice daily -Last blood pressure reading was 149/60   Hyperlipidemia -Continue Pravastatin 40 mg po Daily    Depression -C/w Duloxetine 60 mg po Daily   Leukocytosis -WBC Trend: Recent Labs  Lab 06/10/23 1125 06/11/23 0505 06/12/23 0527 06/13/23 0521  WBC 4.2 4.5 10.7* 10.6*  -In the setting of Steroid Demargination -Continue to Monitor for S/Sx of Infection -Repeat CBC in the AM   Hyponatremia -Na+ Trend: Recent Labs  Lab 06/10/23 1125 06/11/23 0505 06/12/23 0527 06/13/23 0521  NA 137 134* 137 137  -Continue to Monitor and Trend and repeat CMP in the AM  Hypokalemia -Patient's K+ Level Trend: Recent Labs  Lab 06/10/23 1125 06/11/23 0505 06/12/23 0527 06/13/23 0521  K 3.1* 3.4* 4.0 3.6  -Replete with p.o. KCl 40 mEQ x1  -Continue to Monitor and Replete as Necessary -Repeat CMP in the AM   GERD/GI Prophylaxis -C/w Pantoprazole 40 mg po Daily   Class III (Superly Morbid) Obesity -Complicates overall prognosis and care -Estimated body mass index is 54.87 kg/m as calculated from the following:   Height as of this encounter: 5\' 7"  (1.702 m).   Weight as of this encounter: 158.9 kg.  -Weight Loss and Dietary Counseling given

## 2023-06-11 NOTE — Plan of Care (Signed)
  Problem: Education: Goal: Knowledge of General Education information will improve Description: Including pain rating scale, medication(s)/side effects and non-pharmacologic comfort measures Outcome: Progressing   Problem: Clinical Measurements: Goal: Ability to maintain clinical measurements within normal limits will improve Outcome: Progressing Goal: Will remain free from infection Outcome: Progressing   Problem: Activity: Goal: Risk for activity intolerance will decrease Outcome: Progressing   Problem: Nutrition: Goal: Adequate nutrition will be maintained Outcome: Progressing   Problem: Pain Management: Goal: General experience of comfort will improve Outcome: Progressing   Problem: Safety: Goal: Ability to remain free from injury will improve Outcome: Progressing

## 2023-06-11 NOTE — TOC CM/SW Note (Signed)
Transition of Care [facility]) - Inpatient Brief Assessment   Patient Details  Name: Judy Baker MRN: [id number] Date of Birth: 1970/09/12  Transition of Care [facility]) CM/SW Contact:    Otelia Santee, LCSW Phone Number: 06/11/2023, 12:08 PM   Clinical Narrative: Met with pt to review SDOH concern for transportation. Pt shares she has Medicaid transportation for medical appointments however, does not have transportation for other daily living tasks. CSW discussed having prescriptions mailed to her house to eliminate need for transportation to pharmacy. Pt reports she has her gorceries delivered to her house for free from [employer]. Additional transportation resources placed on AVS. PT shares her gas is off and she does not have heat in her house. She is familiar with resources in the community for utility assistance however, social services and [employer] are out of funding and unable to assist with utility bills until beginning of the year.  Pt currently requiring O2. TOC will follow for discharge need.    Transition of Care Asessment: Insurance and Status: Insurance coverage has been reviewed Patient has primary care physician: Yes Home environment has been reviewed: Apartment Prior level of function:: Independent Prior/Current Home Services: No current home services Social Determinants of Health Reivew: SDOH reviewed no interventions necessary Readmission risk has been reviewed: Yes Transition of care needs: transition of care needs identified, TOC will continue to follow

## 2023-06-11 NOTE — Discharge Instructions (Signed)
 FOOD PANTRY [facility] [address] [phone]  [facility] [address] [place] [phone]  [facility] - Food Distribution Center [address] [place] [phone]  [facility] [address] [phone]  [facility] - Food Distribution Center [address] [place], [place] [address] [phone]  UTILITIES [facility] [address] [place] [phone] Rental assistance/rental hotline: [phone] ext. 340.    Utility assistance/utility hotline: [phone] ext. [address] Department of IT consultant (heating/cooling and water assistance) [phone] (rental and utility assistance) [phone]  [employer] - call 211  TRANSPORTATION [facility] [address] [place], [place] [address] [phone]  [employer] [place] I-Ride Reservations Line: [phone]     Passengers can simply call the [employer] reservations number at [phone] for pickup. However, with [employer] same-day service is available with at least two hour notice Monday through Friday. You will need your [employer] [place] client ID# when you call for reservations. If you do not know it, you can call [phone] to request it. [employer] offers a flat fare of $8.50 per trip. This will cover travel anywhere within the city limits of [place].

## 2023-06-11 NOTE — Telephone Encounter (Signed)
Copied from CRM [id number]. Topic: Referral - Question >> Jun 11, 2023 10:25 AM Almira Coaster wrote: Reason for CRM: Patient has a few questions regarding home health referral, She stated the home health agency needed an approval from the insurance.

## 2023-06-11 NOTE — Telephone Encounter (Signed)
Contacted patient regarding home health referral. Explained to patient that the Home Health company contacts the insurance company to obtain certification period regarding home health services. Patient was also told that we may have to go with a different company.

## 2023-06-11 NOTE — Plan of Care (Signed)

## 2023-06-11 NOTE — Progress Notes (Signed)
PROGRESS NOTE    Judy Baker  UJW:[id number] DOB: Sep 24, 1970 DOA: 06/10/2023 PCP: Suzan Slick, MD   Brief Narrative:  The patient is a 52 year old African-American female with a past medical history significant for vomiting hypertension, hyperlipidemia, GERD, tobacco abuse, obesity as well as other comorbidities including asthma who was admitted to the hospital with suspected exacerbation of acute COPD with acute respiratory failure with hypoxia.  She presented with a cough and shortness of breath that been getting worse with some congestion.  Cough was productive with gray sputum and she had chest tightness for the last 5 to 6 days.  She does not wear oxygen at baseline and she was transferred to the ED and given nebulized inhaler, Solu-Medrol, Zofran.  She is also placed on 2 L supplemental oxygen nasal cannula and she was noted to desaturate in the mid 80s on room air.  Currently she still feels wheezy and tight in her chest and is slowly improving.  Assessment and Plan:  Acute Exacerbation of COPD -In the setting of ongoing tobacco abuse, increased cough, chest tightness, and wheezing.  No clear inciting factor, respiratory virus panel is negative. -Observation admission changed to Inpatient  -Supplemental oxygen to keep O2 saturation greater than 90% -Change DuoNebs scheduled to Xopenex and Atrovent scheduled and add budesonide Brovana -IV Solu-Medrol 40 mg twice daily increased to 60 mg twice daily ABG    Component Value Date/Time   HCO3 27.8 06/10/2023 1426   TCO2 31 10/11/2022 2143   O2SAT 87.4 06/10/2023 1426  -Treatment as below -Will add Doxycycline 100 mg po BID  Acute Respiratory Failure with Hypoxia -Likely due to COPD exacerbation, though she does not have respiratory acidosis.  -Add budesonide 0.25 mg nebs twice daily as well as arformoterol 15 mcg nebs twice daily -Continue with Xopenex and Atrovent every 6 scheduled and increase Solu-Medrol dose to 60 mg every  12 -Continue with flutter valve, incentive spirometry and guaifenesin 1200 mL p.o. twice daily -Low suspicion for PE, but will obtain D-dimer to rule it out and this was less than 0.27 -Check respiratory virus panel as influenza A/B, RSV and SARS-CoV-2 were negative -Continuous supplemental oxygen via nasal cannula wean O2 as tolerated -Continuous pulse oximetry maintain O2 saturations greater than 90% -Will need an ambulatory home O2 screen and repeat chest x-ray in the a.m.   Tobacco Abuse -Smoking cessation counseling given -Continue with nicotine patch 7 mg transdermally every 24   Hypertension -Currently on Amlodipine 5 mg po qHS and will continue Spirolactone 25 mg po Daily and will hold hydrochlorothiazide 25 mg po Daily  -Continue to monitor blood pressures per protocol -Continue with labetalol 100 mg p.o. twice daily -Last blood pressure reading was 125/75   Hyperlipidemia -Continue Pravastatin 40 mg po Daily    Depression -C/w Duloxetine 60 mg po Daily   Hyponatremia -Na+ Trend: Recent Labs  Lab 06/10/23 1125 06/11/23 0505  NA 137 134*  -Continue to Monitor and Trend and repeat CMP in the AM  Hypokalemia -Patient's K+ Level Trend: Recent Labs  Lab 06/10/23 1125 06/11/23 0505  K 3.1* 3.4*  -Replete with po KCL 40 mEQ x1 -Continue to Monitor and Replete as Necessary -Repeat CMP in the AM   GERD/GI Prophylaxis -C/w Pantoprazole 40 mg po Daily   Class III (Superly Morbid) Obesity -Complicates overall prognosis and care -Estimated body mass index is 50.12 kg/m as calculated from the following:   Height as of this encounter: 5\' 7"  (1.702 m).  Weight as of this encounter: 145.2 kg.  -Weight Loss and Dietary Counseling given   DVT prophylaxis: SCDs Start: 06/10/23 1633    Code Status: Full Code Family Communication: No family currently at bedside  Disposition Plan:  Level of care: Med-Surg Status is: Inpatient Remains inpatient appropriate because:  Needs further clinical improvement in her respiratory status   Consultants:  None  Procedures:  As delineated as above  Antimicrobials:  Anti-infectives (From admission, onward)    Start     Dose/Rate Route Frequency Ordered Stop   06/11/23 2200  doxycycline (VIBRA-TABS) tablet 100 mg        100 mg Oral Every 12 hours 06/11/23 1555         Subjective: Seen and examined at bedside and was still feeling short of breath  Objective: Vitals:   06/11/23 0647 06/11/23 0932 06/11/23 0934 06/11/23 1238  BP: (!) 146/78   125/75  Pulse: 69   71  Resp: 18   18  Temp: (!) 97.5 F (36.4 C)   97.7 F (36.5 C)  TempSrc: Oral   Oral  SpO2: 96% 96% 96% 97%  Weight:      Height:       No intake or output data in the 24 hours ending 06/11/23 1600 Filed Weights   06/10/23 1100  Weight: (!) 145.2 kg   Examination: Physical Exam:  Constitutional: WN/WD morbidly obese African-American female in some mild respiratory distress Respiratory: Diminished to auscultation bilaterally with some coarse breath sounds, no wheezing, rales, rhonchi or crackles. Normal respiratory effort and patient is not tachypenic. No accessory muscle use.  Wearing supplemental oxygen via nasal cannula Cardiovascular: RRR, no murmurs / rubs / gallops. S1 and S2 auscultated.  Trace lower extremity edema Abdomen: Soft, non-tender, distended secondary to body habitus. Bowel sounds positive.  GU: Deferred. Musculoskeletal: No clubbing / cyanosis of digits/nails. No joint deformity upper and lower extremities.  Skin: No rashes, lesions, ulcers on a limited skin evaluation. No induration; Warm and dry.  Neurologic: CN 2-12 grossly intact with no focal deficits. Romberg sign and cerebellar reflexes not assessed.  Psychiatric: Normal judgment and insight. Alert and oriented x 3. Normal mood and appropriate affect.   Data Reviewed: I have personally reviewed following labs and imaging studies  CBC: Recent Labs  Lab  06/10/23 1125 06/11/23 0505  WBC 4.2 4.5  HGB 15.8* 15.6*  HCT 48.1* 46.9*  MCV 96.4 95.1  PLT 218 210   Basic Metabolic Panel: Recent Labs  Lab 06/10/23 1125 06/11/23 0505  NA 137 134*  K 3.1* 3.4*  CL 100 97*  CO2 24 25  GLUCOSE 103* 141*  BUN 11 19  CREATININE 0.82 0.79  CALCIUM 9.9 9.9   GFR: Estimated Creatinine Clearance: 120.6 mL/min (by C-G formula based on SCr of 0.79 mg/dL). Liver Function Tests: Recent Labs  Lab 06/10/23 1125  AST 38  ALT 42  ALKPHOS 80  BILITOT 0.5  PROT 8.1  ALBUMIN 4.0   No results for input(s): "LIPASE", "AMYLASE" in the last 168 hours. No results for input(s): "AMMONIA" in the last 168 hours. Coagulation Profile: No results for input(s): "INR", "PROTIME" in the last 168 hours. Cardiac Enzymes: No results for input(s): "CKTOTAL", "CKMB", "CKMBINDEX", "TROPONINI" in the last 168 hours. BNP (last 3 results) No results for input(s): "PROBNP" in the last 8760 hours. HbA1C: No results for input(s): "HGBA1C" in the last 72 hours. CBG: No results for input(s): "GLUCAP" in the last 168 hours. Lipid Profile:  No results for input(s): "CHOL", "HDL", "LDLCALC", "TRIG", "CHOLHDL", "LDLDIRECT" in the last 72 hours. Thyroid Function Tests: No results for input(s): "TSH", "T4TOTAL", "FREET4", "T3FREE", "THYROIDAB" in the last 72 hours. Anemia Panel: No results for input(s): "VITAMINB12", "FOLATE", "FERRITIN", "TIBC", "IRON", "RETICCTPCT" in the last 72 hours. Sepsis Labs: No results for input(s): "PROCALCITON", "LATICACIDVEN" in the last 168 hours.  Recent Results (from the past 240 hour(s))  Resp panel by RT-PCR (RSV, Flu A&B, Covid) Anterior Nasal Swab     Status: None   Collection Time: 06/10/23  2:26 PM   Specimen: Anterior Nasal Swab  Result Value Ref Range Status   SARS Coronavirus 2 by RT PCR NEGATIVE NEGATIVE Final    Comment: (NOTE) SARS-CoV-2 target nucleic acids are NOT DETECTED.  The SARS-CoV-2 RNA is generally detectable  in upper respiratory specimens during the acute phase of infection. The lowest concentration of SARS-CoV-2 viral copies this assay can detect is 138 copies/mL. A negative result does not preclude SARS-Cov-2 infection and should not be used as the sole basis for treatment or other patient management decisions. A negative result may occur with  improper specimen collection/handling, submission of specimen other than nasopharyngeal swab, presence of viral mutation(s) within the areas targeted by this assay, and inadequate number of viral copies(<138 copies/mL). A negative result must be combined with clinical observations, patient history, and epidemiological information. The expected result is Negative.  Fact Sheet for Patients:  BloggerCourse.com  Fact Sheet for Healthcare Providers:  SeriousBroker.it  This test is no t yet approved or cleared by the [place] FDA and  has been authorized for detection and/or diagnosis of SARS-CoV-2 by FDA under an Emergency Use Authorization (EUA). This EUA will remain  in effect (meaning this test can be used) for the duration of the COVID-19 declaration under Section 564(b)(1) of the Act, 21 U.S.C.section 360bbb-3(b)(1), unless the authorization is terminated  or revoked sooner.       Influenza A by PCR NEGATIVE NEGATIVE Final   Influenza B by PCR NEGATIVE NEGATIVE Final    Comment: (NOTE) The Xpert Xpress SARS-CoV-2/FLU/RSV plus assay is intended as an aid in the diagnosis of influenza from Nasopharyngeal swab specimens and should not be used as a sole basis for treatment. Nasal washings and aspirates are unacceptable for Xpert Xpress SARS-CoV-2/FLU/RSV testing.  Fact Sheet for Patients: BloggerCourse.com  Fact Sheet for Healthcare Providers: SeriousBroker.it  This test is not yet approved or cleared by the [place] FDA and has  been authorized for detection and/or diagnosis of SARS-CoV-2 by FDA under an Emergency Use Authorization (EUA). This EUA will remain in effect (meaning this test can be used) for the duration of the COVID-19 declaration under Section 564(b)(1) of the Act, 21 U.S.C. section 360bbb-3(b)(1), unless the authorization is terminated or revoked.     Resp Syncytial Virus by PCR NEGATIVE NEGATIVE Final    Comment: (NOTE) Fact Sheet for Patients: BloggerCourse.com  Fact Sheet for Healthcare Providers: SeriousBroker.it  This test is not yet approved or cleared by the [place] FDA and has been authorized for detection and/or diagnosis of SARS-CoV-2 by FDA under an Emergency Use Authorization (EUA). This EUA will remain in effect (meaning this test can be used) for the duration of the COVID-19 declaration under Section 564(b)(1) of the Act, 21 U.S.C. section 360bbb-3(b)(1), unless the authorization is terminated or revoked.  Performed at [facility], [address], [place], [place] [address]     Radiology Studies: DG Chest Portable 1  View  Result Date: 06/10/2023 CLINICAL DATA:  Shortness of breath with chest tightness, cough and wheezing x 5 EXAM: PORTABLE CHEST 1 VIEW COMPARISON:  Radiographs 05/13/2019 and 03/01/2019.  CT 10/12/2022. FINDINGS: 1340 hours. Two views are submitted. The heart size and mediastinal contours are stable. Mildly increased atelectasis at both lung bases. No confluent airspace disease, significant pleural effusion or pneumothorax. The bones appear unchanged. Telemetry leads overlie the chest. IMPRESSION: Mildly increased bibasilar atelectasis. No other acute cardiopulmonary process. Electronically Signed   By: Carey Bullocks M.D.   On: 06/10/2023 15:17    Scheduled Meds:  amLODipine  5 mg Oral QHS   arformoterol  15 mcg Nebulization BID   budesonide (PULMICORT) nebulizer solution  0.25 mg  Nebulization BID   doxycycline  100 mg Oral Q12H   DULoxetine  60 mg Oral Daily   enoxaparin (LOVENOX) injection  70 mg Subcutaneous Q24H   guaiFENesin  1,200 mg Oral BID   ipratropium  0.5 mg Nebulization Q6H   labetalol  100 mg Oral BID   levalbuterol  0.63 mg Nebulization Q6H   methylPREDNISolone (SOLU-MEDROL) injection  60 mg Intravenous Q12H   montelukast  10 mg Oral QHS   nicotine  7 mg Transdermal Daily   pantoprazole  40 mg Oral Daily   potassium chloride  40 mEq Oral Once   pravastatin  40 mg Oral Daily   spironolactone  25 mg Oral Daily   traZODone  100 mg Oral QHS   Continuous Infusions:   LOS: 0 days   Marguerita Merles, DO [facility] Available via Epic secure chat 7am-7pm After these hours, please refer to coverage provider listed on [email or web address] 06/11/2023, 4:00 PM

## 2023-06-12 ENCOUNTER — Inpatient Hospital Stay (HOSPITAL_COMMUNITY): Payer: Medicaid Other

## 2023-06-12 ENCOUNTER — Ambulatory Visit: Payer: Medicaid Other | Admitting: Family Medicine

## 2023-06-12 DIAGNOSIS — J9601 Acute respiratory failure with hypoxia: Secondary | ICD-10-CM | POA: Diagnosis not present

## 2023-06-12 DIAGNOSIS — D72829 Elevated white blood cell count, unspecified: Secondary | ICD-10-CM

## 2023-06-12 DIAGNOSIS — E876 Hypokalemia: Secondary | ICD-10-CM | POA: Diagnosis not present

## 2023-06-12 DIAGNOSIS — J441 Chronic obstructive pulmonary disease with (acute) exacerbation: Secondary | ICD-10-CM | POA: Diagnosis not present

## 2023-06-12 LAB — COMPREHENSIVE METABOLIC PANEL
ALT: 30 U/L (ref 0–44)
AST: 18 U/L (ref 15–41)
Albumin: 3.7 g/dL (ref 3.5–5.0)
Alkaline Phosphatase: 74 U/L (ref 38–126)
Anion gap: 11 (ref 5–15)
BUN: 27 mg/dL — ABNORMAL HIGH (ref 6–20)
CO2: 24 mmol/L (ref 22–32)
Calcium: 9.7 mg/dL (ref 8.9–10.3)
Chloride: 102 mmol/L (ref 98–111)
Creatinine, Ser: 0.79 mg/dL (ref 0.44–1.00)
GFR, Estimated: >60 mL/min (ref >60)
Glucose, Bld: 126 mg/dL — ABNORMAL HIGH (ref 70–99)
Potassium: 4 mmol/L (ref 3.5–5.1)
Sodium: 137 mmol/L (ref 135–145)
Total Bilirubin: 0.4 mg/dL (ref <1.2)
Total Protein: 7.5 g/dL (ref 6.5–8.1)

## 2023-06-12 LAB — CBC WITH DIFFERENTIAL/PLATELET
Abs Immature Granulocytes: 0.05 10*3/uL (ref 0.00–0.07)
Basophils Absolute: 0 10*3/uL (ref 0.0–0.1)
Basophils Relative: 0 %
Eosinophils Absolute: 0 10*3/uL (ref 0.0–0.5)
Eosinophils Relative: 0 %
HCT: 45.4 % (ref 36.0–46.0)
Hemoglobin: 14.7 g/dL (ref 12.0–15.0)
Immature Granulocytes: 1 %
Lymphocytes Relative: 13 %
Lymphs Abs: 1.4 10*3/uL (ref 0.7–4.0)
MCH: 31.8 pg (ref 26.0–34.0)
MCHC: 32.4 g/dL (ref 30.0–36.0)
MCV: 98.3 fL (ref 80.0–100.0)
Monocytes Absolute: 0.7 10*3/uL (ref 0.1–1.0)
Monocytes Relative: 6 %
Neutro Abs: 8.6 10*3/uL — ABNORMAL HIGH (ref 1.7–7.7)
Neutrophils Relative %: 80 %
Platelets: 235 10*3/uL (ref 150–400)
RBC: 4.62 MIL/uL (ref 3.87–5.11)
RDW: 12.9 % (ref 11.5–15.5)
WBC: 10.7 10*3/uL — ABNORMAL HIGH (ref 4.0–10.5)
nRBC: 0 % (ref 0.0–0.2)

## 2023-06-12 LAB — RESPIRATORY PANEL BY PCR

## 2023-06-12 LAB — PHOSPHORUS: Phosphorus: 3.7 mg/dL (ref 2.5–4.6)

## 2023-06-12 LAB — MAGNESIUM: Magnesium: 2.2 mg/dL (ref 1.7–2.4)

## 2023-06-12 LAB — BRAIN NATRIURETIC PEPTIDE: B Natriuretic Peptide: 72.3 pg/mL (ref 0.0–100.0)

## 2023-06-12 MED ORDER — GUAIFENESIN-DM 100-10 MG/5ML PO SYRP
5.0000 mL | ORAL_SOLUTION | ORAL | Status: DC | PRN
  Administered 2023-06-12 – 2023-06-13 (×2): 5 mL via ORAL
  Filled 2023-06-12 (×2): qty 5

## 2023-06-12 MED ORDER — IPRATROPIUM BROMIDE 0.02 % IN SOLN
0.5000 mg | Freq: Three times a day (TID) | RESPIRATORY_TRACT | Status: DC
  Administered 2023-06-12 – 2023-06-16 (×10): 0.5 mg via RESPIRATORY_TRACT
  Filled 2023-06-12 (×13): qty 2.5

## 2023-06-12 MED ORDER — FUROSEMIDE 10 MG/ML IJ SOLN
40.0000 mg | Freq: Once | INTRAMUSCULAR | Status: AC
  Administered 2023-06-12: 40 mg via INTRAVENOUS
  Filled 2023-06-12: qty 4

## 2023-06-12 MED ORDER — LEVALBUTEROL HCL 0.63 MG/3ML IN NEBU
0.6300 mg | INHALATION_SOLUTION | Freq: Three times a day (TID) | RESPIRATORY_TRACT | Status: DC
  Administered 2023-06-12 – 2023-06-16 (×10): 0.63 mg via RESPIRATORY_TRACT
  Filled 2023-06-12 (×12): qty 3

## 2023-06-12 NOTE — Progress Notes (Signed)
   06/12/23 2221  BiPAP/CPAP/SIPAP  BiPAP/CPAP/SIPAP Pt Type Adult  BiPAP/CPAP/SIPAP DREAMSTATIOND  Mask Type Nasal mask  Flow Rate 2 lpm  Patient Home Equipment No  Auto Titrate Yes (8-20)  BiPAP/CPAP /SiPAP Vitals  Pulse Rate 72  Resp 19  SpO2 96 %  Bilateral Breath Sounds Diminished  MEWS Score/Color  MEWS Score 0  MEWS Score Color Judy Baker

## 2023-06-12 NOTE — Progress Notes (Signed)
PROGRESS NOTE    Judy Baker  BMW:[id number] DOB: [birth year] DOA: 06/10/2023 PCP: Suzan Slick, MD   Brief Narrative:  The patient is a 52 year old African-American female with a past medical history significant for vomiting hypertension, hyperlipidemia, GERD, tobacco abuse, obesity as well as other comorbidities including asthma who was admitted to the hospital with suspected exacerbation of acute COPD with acute respiratory failure with hypoxia.  She presented with a cough and shortness of breath that been getting worse with some congestion.  Cough was productive with gray sputum and she had chest tightness for the last 5 to 6 days.  She does not wear oxygen at baseline and she was transferred to the ED and given nebulized inhaler, Solu-Medrol, Zofran.  She is also placed on 2 L supplemental oxygen nasal cannula and she was noted to desaturate in the mid 80s on room air.  Currently she still feels wheezy and tight in her chest and very Dyspenic on Exertion.   Given CXR findings will start trial of Diuresis and obtain ECHOCardiogram.   Assessment and Plan:  Acute Exacerbation of COPD -In the setting of ongoing tobacco abuse, increased cough, chest tightness, and wheezing.  No clear inciting factor, respiratory virus panel is negative. -Observation admission changed to Inpatient  -Supplemental oxygen to keep O2 saturation greater than 90% -Change DuoNebs scheduled to Xopenex and Atrovent scheduled and add budesonide Brovana -IV Solu-Medrol 40 mg twice daily increased to 60 mg twice daily yesterday and will continue at current dose ABG    Component Value Date/Time   HCO3 27.8 06/10/2023 1426   TCO2 31 10/11/2022 2143   O2SAT 87.4 06/10/2023 1426  -Treatment as below -Will add Doxycycline 100 mg po BID -Respiratory Virus Panel Negative  Acute Respiratory Failure with Hypoxia -Likely due to COPD exacerbation, though she does not have respiratory acidosis. ? Component of CHF   -Added budesonide 0.25 mg nebs twice daily as well as arformoterol 15 mcg nebs twice daily -Continue with Xopenex and Atrovent every 6 scheduled and increase Solu-Medrol dose to 60 mg every 12 -Continue with flutter valve, incentive spirometry and guaifenesin 1200 mL p.o. twice daily -Low suspicion for PE, but will obtain D-dimer to rule it out and this was less than 0.27 SpO2: 96 % O2 Flow Rate (L/min): 2 L/min -Checked respiratory virus panel and was negative as well as influenza A/B, RSV and SARS-CoV-2 were negative -Check BNP and was 72.3 but not as reliable in obese  -Check ECHOCardiogram  -Trial of Lasix and will give her IV 40 mg x1 given CXR findings showing "Constellation of findings are favored to reflect pulmonary edema." -Continuous supplemental oxygen via nasal cannula wean O2 as tolerated -Continuous pulse oximetry maintain O2 saturations greater than 90% -Will need an ambulatory home O2 screen and repeat chest x-ray in the a.m.   Tobacco Abuse -Smoking cessation counseling given -Continue with nicotine patch 7 mg Transdermally every 24   Hypertension -Currently on Amlodipine 5 mg po qHS and will continue Spirolactone 25 mg po Daily and will hold hydrochlorothiazide 25 mg po Daily  -Continue to monitor blood pressures per protocol -Continue with labetalol 100 mg p.o. twice daily -Last blood pressure reading was 146/71   Hyperlipidemia -Continue Pravastatin 40 mg po Daily    Depression -C/w Duloxetine 60 mg po Daily   Leukocytosis -WBC Trend: Recent Labs  Lab 06/10/23 1125 06/11/23 0505 06/12/23 0527  WBC 4.2 4.5 10.7*  -In the setting of Steroid Demargination -Continue to Monitor for  S/Sx of Infection -Repeat CBC in the AM   Hyponatremia -Na+ Trend: Recent Labs  Lab 06/10/23 1125 06/11/23 0505 06/12/23 0527  NA 137 134* 137  -Continue to Monitor and Trend and repeat CMP in the AM  Hypokalemia -Patient's K+ Level Trend: Recent Labs  Lab  06/10/23 1125 06/11/23 0505 06/12/23 0527  K 3.1* 3.4* 4.0  -Replete with po KCL 40 mEQ x1 yesterday  -Continue to Monitor and Replete as Necessary -Repeat CMP in the AM   GERD/GI Prophylaxis -C/w Pantoprazole 40 mg po Daily   Class III (Superly Morbid) Obesity -Complicates overall prognosis and care -Estimated body mass index is 50.12 kg/m as calculated from the following:   Height as of this encounter: 5\' 7"  (1.702 m).   Weight as of this encounter: 145.2 kg.  -Weight Loss and Dietary Counseling given   DVT prophylaxis: SCDs Start: 06/10/23 1633    Code Status: Full Code Family Communication: No family present at bedside  Disposition Plan:  Level of care: Med-Surg Status is: Inpatient Remains inpatient appropriate because: Continues to be significantly Dyspneic on Exertion   Consultants:  None  Procedures:  ECHOCARDIOGRAM  Antimicrobials:  Anti-infectives (From admission, onward)    Start     Dose/Rate Route Frequency Ordered Stop   06/11/23 1700  doxycycline (VIBRA-TABS) tablet 100 mg        100 mg Oral Every 12 hours 06/11/23 1555         Subjective: Seen and examined at bedside and she was extremely dyspneic still and states that anytime she ambulates he becomes extremely dyspneic on exertion and fatigued.  Continues to be short of breath and continues to wheeze significantly.  No lightheadedness or dizziness.  States that she has not been able to lay flat for quite a while now.  Also thinks that her legs have been more bit more swollen recently.  No other concerns or other complaints at this time.  Objective: Vitals:   06/11/23 2020 06/12/23 0528 06/12/23 0848 06/12/23 1316  BP:  (!) 143/82  (!) 146/71  Pulse:  75  67  Resp:  18  17  Temp:  98.5 F (36.9 C)  98.6 F (37 C)  TempSrc:  Oral  Oral  SpO2: 94% 95% 94% 96%  Weight:      Height:        Intake/Output Summary (Last 24 hours) at 06/12/2023 1422 Last data filed at 06/12/2023 1336 Gross per  24 hour  Intake --  Output 1750 ml  Net -1750 ml   Filed Weights   06/10/23 1100  Weight: (!) 145.2 kg   Examination: Physical Exam:  Constitutional: WN/WD obese AAF who appears dyspenic and still feels SOB Respiratory: Diminished to auscultation bilaterally with coarse breath sounds and has significant wheezing, no wheezing, rales, rhonchi or crackles. Normal respiratory effort and patient is not tachypenic. No accessory muscle use. Wearing Supplemental O2 via [place].  Cardiovascular: RRR, no murmurs / rubs / gallops. S1 and S2 auscultated. 1+ LE edema  Abdomen: Soft, non-tender Distended 2/2 body habitus. Bowel sounds positive.  GU: Deferred. Musculoskeletal: No clubbing / cyanosis of digits/nails. No joint deformity upper and lower extremities. Good ROM, no contractures. Normal strength and muscle tone.  Skin: No rashes, lesions, ulcers. No induration; Warm and dry.  Neurologic: CN 2-12 grossly intact with no focal deficits. Romberg sign and cerebellar reflexes not assessed.  Psychiatric: Normal judgment and insight. Alert and oriented x 3. Normal mood and appropriate affect.  Data Reviewed: I have personally reviewed following labs and imaging studies  CBC: Recent Labs  Lab 06/10/23 1125 06/11/23 0505 06/12/23 0527  WBC 4.2 4.5 10.7*  NEUTROABS  --   --  8.6*  HGB 15.8* 15.6* 14.7  HCT 48.1* 46.9* 45.4  MCV 96.4 95.1 98.3  PLT 218 210 235   Basic Metabolic Panel: Recent Labs  Lab 06/10/23 1125 06/11/23 0505 06/12/23 0527  NA 137 134* 137  K 3.1* 3.4* 4.0  CL 100 97* 102  CO2 24 25 24   GLUCOSE 103* 141* 126*  BUN 11 19 27*  CREATININE 0.82 0.79 0.79  CALCIUM 9.9 9.9 9.7  MG  --   --  2.2  PHOS  --   --  3.7   GFR: Estimated Creatinine Clearance: 120.6 mL/min (by C-G formula based on SCr of 0.79 mg/dL). Liver Function Tests: Recent Labs  Lab 06/10/23 1125 06/12/23 0527  AST 38 18  ALT 42 30  ALKPHOS 80 74  BILITOT 0.5 0.4  PROT 8.1 7.5  ALBUMIN 4.0 3.7    No results for input(s): "LIPASE", "AMYLASE" in the last 168 hours. No results for input(s): "AMMONIA" in the last 168 hours. Coagulation Profile: No results for input(s): "INR", "PROTIME" in the last 168 hours. Cardiac Enzymes: No results for input(s): "CKTOTAL", "CKMB", "CKMBINDEX", "TROPONINI" in the last 168 hours. BNP (last 3 results) No results for input(s): "PROBNP" in the last 8760 hours. HbA1C: No results for input(s): "HGBA1C" in the last 72 hours. CBG: No results for input(s): "GLUCAP" in the last 168 hours. Lipid Profile: No results for input(s): "CHOL", "HDL", "LDLCALC", "TRIG", "CHOLHDL", "LDLDIRECT" in the last 72 hours. Thyroid Function Tests: No results for input(s): "TSH", "T4TOTAL", "FREET4", "T3FREE", "THYROIDAB" in the last 72 hours. Anemia Panel: No results for input(s): "VITAMINB12", "FOLATE", "FERRITIN", "TIBC", "IRON", "RETICCTPCT" in the last 72 hours. Sepsis Labs: No results for input(s): "PROCALCITON", "LATICACIDVEN" in the last 168 hours.  Recent Results (from the past 240 hours)  Resp panel by RT-PCR (RSV, Flu A&B, Covid) Anterior Nasal Swab     Status: None   Collection Time: 06/10/23  2:26 PM   Specimen: Anterior Nasal Swab  Result Value Ref Range Status   SARS Coronavirus 2 by RT PCR NEGATIVE NEGATIVE Final    Comment: (NOTE) SARS-CoV-2 target nucleic acids are NOT DETECTED.  The SARS-CoV-2 RNA is generally detectable in upper respiratory specimens during the acute phase of infection. The lowest concentration of SARS-CoV-2 viral copies this assay can detect is 138 copies/mL. A negative result does not preclude SARS-Cov-2 infection and should not be used as the sole basis for treatment or other patient management decisions. A negative result may occur with  improper specimen collection/handling, submission of specimen other than nasopharyngeal swab, presence of viral mutation(s) within the areas targeted by this assay, and inadequate number of  viral copies(<138 copies/mL). A negative result must be combined with clinical observations, patient history, and epidemiological information. The expected result is Negative.  Fact Sheet for Patients:  BloggerCourse.com  Fact Sheet for Healthcare Providers:  SeriousBroker.it  This test is no t yet approved or cleared by the [place] FDA and  has been authorized for detection and/or diagnosis of SARS-CoV-2 by FDA under an Emergency Use Authorization (EUA). This EUA will remain  in effect (meaning this test can be used) for the duration of the COVID-19 declaration under Section 564(b)(1) of the Act, 21 U.S.C.section 360bbb-3(b)(1), unless the authorization is terminated  or revoked sooner.  Influenza A by PCR NEGATIVE NEGATIVE Final   Influenza B by PCR NEGATIVE NEGATIVE Final    Comment: (NOTE) The Xpert Xpress SARS-CoV-2/FLU/RSV plus assay is intended as an aid in the diagnosis of influenza from Nasopharyngeal swab specimens and should not be used as a sole basis for treatment. Nasal washings and aspirates are unacceptable for Xpert Xpress SARS-CoV-2/FLU/RSV testing.  Fact Sheet for Patients: BloggerCourse.com  Fact Sheet for Healthcare Providers: SeriousBroker.it  This test is not yet approved or cleared by the [place] FDA and has been authorized for detection and/or diagnosis of SARS-CoV-2 by FDA under an Emergency Use Authorization (EUA). This EUA will remain in effect (meaning this test can be used) for the duration of the COVID-19 declaration under Section 564(b)(1) of the Act, 21 U.S.C. section 360bbb-3(b)(1), unless the authorization is terminated or revoked.     Resp Syncytial Virus by PCR NEGATIVE NEGATIVE Final    Comment: (NOTE) Fact Sheet for Patients: BloggerCourse.com  Fact Sheet for Healthcare  Providers: SeriousBroker.it  This test is not yet approved or cleared by the [place] FDA and has been authorized for detection and/or diagnosis of SARS-CoV-2 by FDA under an Emergency Use Authorization (EUA). This EUA will remain in effect (meaning this test can be used) for the duration of the COVID-19 declaration under Section 564(b)(1) of the Act, 21 U.S.C. section 360bbb-3(b)(1), unless the authorization is terminated or revoked.  Performed at [facility], [address], [place], [place] [address]   Respiratory (~20 pathogens) panel by PCR     Status: None   Collection Time: 06/11/23  3:00 PM  Result Value Ref Range Status   Adenovirus NOT DETECTED NOT DETECTED Final   Coronavirus 229E NOT DETECTED NOT DETECTED Final    Comment: (NOTE) The Coronavirus on the Respiratory Panel, DOES NOT test for the novel  Coronavirus (2019 nCoV)    Coronavirus HKU1 NOT DETECTED NOT DETECTED Final   Coronavirus NL63 NOT DETECTED NOT DETECTED Final   Coronavirus OC43 NOT DETECTED NOT DETECTED Final   Metapneumovirus NOT DETECTED NOT DETECTED Final   Rhinovirus / Enterovirus NOT DETECTED NOT DETECTED Final   Influenza A NOT DETECTED NOT DETECTED Final   Influenza B NOT DETECTED NOT DETECTED Final   Parainfluenza Virus 1 NOT DETECTED NOT DETECTED Final   Parainfluenza Virus 2 NOT DETECTED NOT DETECTED Final   Parainfluenza Virus 3 NOT DETECTED NOT DETECTED Final   Parainfluenza Virus 4 NOT DETECTED NOT DETECTED Final   Respiratory Syncytial Virus NOT DETECTED NOT DETECTED Final   Bordetella pertussis NOT DETECTED NOT DETECTED Final   Bordetella Parapertussis NOT DETECTED NOT DETECTED Final   Chlamydophila pneumoniae NOT DETECTED NOT DETECTED Final   Mycoplasma pneumoniae NOT DETECTED NOT DETECTED Final    Comment: Performed at [facility], [address], [place], [place] [address]    Radiology Studies: DG CHEST PORT 1 VIEW Result  Date: 06/12/2023 CLINICAL DATA:  [id number] SOB (shortness of breath) [id number] EXAM: PORTABLE CHEST 1 VIEW COMPARISON:  June 10, 2023 FINDINGS: Incomplete assessment of the costophrenic angles. The cardiomediastinal silhouette is unchanged and enlarged in contour. Possible small RIGHT pleural effusion. No pneumothorax. Favored overall increased interstitial markings with peribronchial cuffing and vascular indistinctness. IMPRESSION: Constellation of findings are favored to reflect pulmonary edema. Electronically Signed   By: Meda Klinefelter M.D.   On: 06/12/2023 07:58   Scheduled Meds:  amLODipine  5 mg Oral QHS   arformoterol  15 mcg Nebulization BID  budesonide (PULMICORT) nebulizer solution  0.25 mg Nebulization BID   doxycycline  100 mg Oral Q12H   DULoxetine  60 mg Oral Daily   enoxaparin (LOVENOX) injection  70 mg Subcutaneous Q24H   guaiFENesin  1,200 mg Oral BID   ipratropium  0.5 mg Nebulization TID   labetalol  100 mg Oral BID   levalbuterol  0.63 mg Nebulization TID   methylPREDNISolone (SOLU-MEDROL) injection  60 mg Intravenous Q12H   montelukast  10 mg Oral QHS   nicotine  7 mg Transdermal Daily   pantoprazole  40 mg Oral Daily   pravastatin  40 mg Oral Daily   spironolactone  25 mg Oral Daily   traZODone  100 mg Oral QHS   Continuous Infusions:   LOS: 1 day   Marguerita Merles, DO [facility] Available via Epic secure chat 7am-7pm After these hours, please refer to coverage provider listed on [email or web address] 06/12/2023, 2:22 PM

## 2023-06-12 NOTE — Plan of Care (Signed)

## 2023-06-12 NOTE — Plan of Care (Signed)

## 2023-06-13 ENCOUNTER — Inpatient Hospital Stay (HOSPITAL_COMMUNITY): Payer: Medicaid Other

## 2023-06-13 DIAGNOSIS — E876 Hypokalemia: Secondary | ICD-10-CM | POA: Diagnosis not present

## 2023-06-13 DIAGNOSIS — J9601 Acute respiratory failure with hypoxia: Secondary | ICD-10-CM | POA: Diagnosis not present

## 2023-06-13 DIAGNOSIS — D72829 Elevated white blood cell count, unspecified: Secondary | ICD-10-CM | POA: Diagnosis not present

## 2023-06-13 DIAGNOSIS — J441 Chronic obstructive pulmonary disease with (acute) exacerbation: Secondary | ICD-10-CM | POA: Diagnosis not present

## 2023-06-13 DIAGNOSIS — R0609 Other forms of dyspnea: Secondary | ICD-10-CM

## 2023-06-13 LAB — ECHOCARDIOGRAM COMPLETE
AR max vel: 2.35 cm2
AV Area VTI: 2.73 cm2
AV Area mean vel: 2.19 cm2
AV Mean grad: 5 mm[Hg]
AV Peak grad: 10.5 mm[Hg]
Ao pk vel: 1.62 m/s
Area-P 1/2: 3.03 cm2
Calc EF: 55 %
Est EF: 55
Height: 67 in
MV VTI: 3.38 cm2
S' Lateral: 3.4 cm
Single Plane A2C EF: 60.3 %
Single Plane A4C EF: 50.5 %
Weight: 5604.98 [oz_av]

## 2023-06-13 LAB — CBC WITH DIFFERENTIAL/PLATELET
Abs Immature Granulocytes: 0.07 10*3/uL (ref 0.00–0.07)
Basophils Absolute: 0 10*3/uL (ref 0.0–0.1)
Basophils Relative: 0 %
Eosinophils Absolute: 0 10*3/uL (ref 0.0–0.5)
Eosinophils Relative: 0 %
HCT: 45.1 % (ref 36.0–46.0)
Hemoglobin: 15.1 g/dL — ABNORMAL HIGH (ref 12.0–15.0)
Immature Granulocytes: 1 %
Lymphocytes Relative: 13 %
Lymphs Abs: 1.4 10*3/uL (ref 0.7–4.0)
MCH: 31.9 pg (ref 26.0–34.0)
MCHC: 33.5 g/dL (ref 30.0–36.0)
MCV: 95.3 fL (ref 80.0–100.0)
Monocytes Absolute: 0.7 10*3/uL (ref 0.1–1.0)
Monocytes Relative: 6 %
Neutro Abs: 8.5 10*3/uL — ABNORMAL HIGH (ref 1.7–7.7)
Neutrophils Relative %: 80 %
Platelets: 248 10*3/uL (ref 150–400)
RBC: 4.73 MIL/uL (ref 3.87–5.11)
RDW: 13 % (ref 11.5–15.5)
WBC: 10.6 10*3/uL — ABNORMAL HIGH (ref 4.0–10.5)
nRBC: 0 % (ref 0.0–0.2)

## 2023-06-13 LAB — COMPREHENSIVE METABOLIC PANEL
ALT: 28 U/L (ref 0–44)
AST: 15 U/L (ref 15–41)
Albumin: 3.6 g/dL (ref 3.5–5.0)
Alkaline Phosphatase: 67 U/L (ref 38–126)
Anion gap: 10 (ref 5–15)
BUN: 24 mg/dL — ABNORMAL HIGH (ref 6–20)
CO2: 27 mmol/L (ref 22–32)
Calcium: 9.4 mg/dL (ref 8.9–10.3)
Chloride: 100 mmol/L (ref 98–111)
Creatinine, Ser: 0.77 mg/dL (ref 0.44–1.00)
GFR, Estimated: >60 mL/min (ref >60)
Glucose, Bld: 116 mg/dL — ABNORMAL HIGH (ref 70–99)
Potassium: 3.6 mmol/L (ref 3.5–5.1)
Sodium: 137 mmol/L (ref 135–145)
Total Bilirubin: 0.3 mg/dL (ref <1.2)
Total Protein: 7.3 g/dL (ref 6.5–8.1)

## 2023-06-13 LAB — GLUCOSE, CAPILLARY: Glucose-Capillary: 118 mg/dL — ABNORMAL HIGH (ref 70–99)

## 2023-06-13 LAB — PHOSPHORUS: Phosphorus: 3.6 mg/dL (ref 2.5–4.6)

## 2023-06-13 LAB — MAGNESIUM: Magnesium: 2.1 mg/dL (ref 1.7–2.4)

## 2023-06-13 MED ORDER — FUROSEMIDE 10 MG/ML IJ SOLN
40.0000 mg | Freq: Once | INTRAMUSCULAR | Status: AC
  Administered 2023-06-13: 40 mg via INTRAVENOUS
  Filled 2023-06-13: qty 4

## 2023-06-13 MED ORDER — AZELASTINE HCL 0.1 % NA SOLN
2.0000 | Freq: Two times a day (BID) | NASAL | Status: DC
Start: 2023-06-13 — End: 2023-06-18
  Administered 2023-06-13 – 2023-06-18 (×11): 2 via NASAL
  Filled 2023-06-13: qty 30

## 2023-06-13 MED ORDER — POTASSIUM CHLORIDE CRYS ER 20 MEQ PO TBCR
40.0000 meq | EXTENDED_RELEASE_TABLET | Freq: Once | ORAL | Status: AC
  Administered 2023-06-13: 40 meq via ORAL
  Filled 2023-06-13: qty 2

## 2023-06-13 MED ORDER — SENNOSIDES-DOCUSATE SODIUM 8.6-50 MG PO TABS
1.0000 | ORAL_TABLET | Freq: Two times a day (BID) | ORAL | Status: DC
Start: 2023-06-13 — End: 2023-06-18
  Administered 2023-06-13 – 2023-06-17 (×7): 1 via ORAL
  Filled 2023-06-13 (×10): qty 1

## 2023-06-13 MED ORDER — BISACODYL 10 MG RE SUPP: 10.0000 mg | Freq: Every day | RECTAL | Status: DC | PRN

## 2023-06-13 MED ORDER — POLYETHYLENE GLYCOL 3350 17 G PO PACK
17.0000 g | PACK | Freq: Two times a day (BID) | ORAL | Status: DC
  Administered 2023-06-13 – 2023-06-17 (×7): 17 g via ORAL
  Filled 2023-06-13 (×11): qty 1

## 2023-06-13 MED ORDER — PANTOPRAZOLE SODIUM 40 MG PO TBEC
40.0000 mg | DELAYED_RELEASE_TABLET | Freq: Two times a day (BID) | ORAL | Status: DC
  Administered 2023-06-13 – 2023-06-18 (×10): 40 mg via ORAL
  Filled 2023-06-13 (×10): qty 1

## 2023-06-13 MED ORDER — IOHEXOL 300 MG/ML  SOLN
75.0000 mL | Freq: Once | INTRAMUSCULAR | Status: AC | PRN
  Administered 2023-06-13: 75 mL via INTRAVENOUS

## 2023-06-13 MED ORDER — HYDROCOD POLI-CHLORPHE POLI ER 10-8 MG/5ML PO SUER
5.0000 mL | Freq: Two times a day (BID) | ORAL | Status: DC
Start: 2023-06-13 — End: 2023-06-17
  Administered 2023-06-13 – 2023-06-14 (×3): 5 mL via ORAL
  Filled 2023-06-13 (×3): qty 5

## 2023-06-13 MED ORDER — METHYLPREDNISOLONE SODIUM SUCC 40 MG IJ SOLR
40.0000 mg | Freq: Two times a day (BID) | INTRAMUSCULAR | Status: DC
  Administered 2023-06-13 – 2023-06-16 (×6): 40 mg via INTRAVENOUS
  Filled 2023-06-13 (×6): qty 1

## 2023-06-13 NOTE — Plan of Care (Signed)
  Problem: Education: Goal: Knowledge of General Education information will improve Description: Including pain rating scale, medication(s)/side effects and non-pharmacologic comfort measures Outcome: Progressing   Problem: Clinical Measurements: Goal: Ability to maintain clinical measurements within normal limits will improve Outcome: Progressing Goal: Will remain free from infection Outcome: Progressing Goal: Diagnostic test results will improve Outcome: Progressing Goal: Respiratory complications will improve Outcome: Progressing Goal: Cardiovascular complication will be avoided Outcome: Progressing   Problem: Activity: Goal: Risk for activity intolerance will decrease Outcome: Progressing   Problem: Nutrition: Goal: Adequate nutrition will be maintained Outcome: Progressing   Problem: Coping: Goal: Level of anxiety will decrease Outcome: Progressing   Problem: Pain Management: Goal: General experience of comfort will improve Outcome: Progressing   Problem: Safety: Goal: Ability to remain free from injury will improve Outcome: Progressing

## 2023-06-13 NOTE — Progress Notes (Signed)
   06/13/23 2131  BiPAP/CPAP/SIPAP  BiPAP/CPAP/SIPAP Pt Type Adult  Reason BIPAP/CPAP not in use Non-compliant (equipment remains on standby if she should change her mind)

## 2023-06-13 NOTE — Plan of Care (Signed)

## 2023-06-13 NOTE — Progress Notes (Signed)
PROGRESS NOTE    Judy Baker  ZOX:[id number] DOB: 08/19/1970 DOA: 06/10/2023 PCP: Suzan Slick, MD   Brief Narrative:  The patient is a 52 year old African-American female with a past medical history significant for vomiting hypertension, hyperlipidemia, GERD, tobacco abuse, obesity as well as other comorbidities including asthma who was admitted to the hospital with suspected exacerbation of acute COPD with acute respiratory failure with hypoxia.  She presented with a cough and shortness of breath that been getting worse with some congestion.  Cough was productive with gray sputum and she had chest tightness for the last 5 to 6 days.  She does not wear oxygen at baseline and she was transferred to the ED and given nebulized inhaler, Solu-Medrol, Zofran.  She is also placed on 2 L supplemental oxygen nasal cannula and she was noted to desaturate in the mid 80s on room air.  Currently she still feels wheezy and tight in her chest and very Dyspenic on Exertion.   Given CXR findings will start trial of Diuresis and obtain ECHOCardiogram.  Give her another dose of diuresis this morning but given her lack of improvement we will obtain a CT scan of the chest with contrast and obtain pulmonary consultation.  Echocardiogram is done but pending read.   Assessment and Plan:  Acute Exacerbation of COPD -In the setting of ongoing tobacco abuse, increased cough, chest tightness, and wheezing.  No clear inciting factor, respiratory virus panel is negative. -Observation admission changed to Inpatient  -Supplemental oxygen to keep O2 saturation greater than 90% -Change DuoNebs scheduled to Xopenex and Atrovent scheduled and add budesonide Brovana -IV Solu-Medrol 40 mg twice daily increased to 60 mg twice daily yesterday and will continue at current dose ABG    Component Value Date/Time   HCO3 27.8 06/10/2023 1426   TCO2 31 10/11/2022 2143   O2SAT 87.4 06/10/2023 1426  -Treatment as below -Will add  Doxycycline 100 mg po BID -Respiratory Virus Panel Negative -See below for further treatment  Acute Respiratory Failure with Hypoxia -Likely due to COPD exacerbation, though she does not have respiratory acidosis. ? Component of CHF  -Added budesonide 0.25 mg nebs twice daily as well as arformoterol 15 mcg nebs twice daily -Continue with Xopenex and Atrovent every 6 scheduled and increase Solu-Medrol dose to 60 mg every 12 -Continue with flutter valve, incentive spirometry and guaifenesin 1200 mL p.o. twice daily -Low suspicion for PE, but will obtain D-dimer to rule it out and this was less than 0.27 SpO2: 97 % O2 Flow Rate (L/min): 2 L/min -Checked respiratory virus panel and was negative as well as influenza A/B, RSV and SARS-CoV-2 were negative -Check BNP and was 72.3 but not as reliable in obese  -Check ECHOCardiogram and still pending to be done -Trial of Lasix and will give her IV 40 mg x1 again (received a dose yesterday) CXR findings yesterday showing "Constellation of findings are favored to reflect pulmonary edema." -Continuous supplemental oxygen via nasal cannula wean O2 as tolerated -Continuous pulse oximetry maintain O2 saturations greater than 90% -Will need an ambulatory home O2 screen and repeat chest x-ray in the a.m. -Given her lack of improvement and significant dyspnea we will check a CT scan of the chest with contrast which showed "There are a few subtle areas of peripheral ground-glass opacity within the right upper lobe and to a lesser degree within the periphery of the left upper lobe. Findings are nonspecific but may represent a mild infectious or inflammatory process. Mild subsegmental  atelectasis within the lingula and medial right lung base." -Pulmonary consulted for further evaluation and recommendations and they are recommending continue supplemental oxygen, increasing her PPI and adding a nasal H2 blocker and also adding low-dose Tussionex to assist with cough as  well as reflux precaution   Tobacco Abuse -Smoking cessation counseling given -Continue with nicotine patch 7 mg Transdermally every 24   Hypertension -Currently on Amlodipine 5 mg po qHS and will continue Spirolactone 25 mg po Daily and will hold hydrochlorothiazide 25 mg po Daily  -Continue to monitor blood pressures per protocol -Continue with labetalol 100 mg p.o. twice daily -Last blood pressure reading was 149/60   Hyperlipidemia -Continue Pravastatin 40 mg po Daily    Depression -C/w Duloxetine 60 mg po Daily   Leukocytosis -WBC Trend: Recent Labs  Lab 06/10/23 1125 06/11/23 0505 06/12/23 0527 06/13/23 0521  WBC 4.2 4.5 10.7* 10.6*  -In the setting of Steroid Demargination -Continue to Monitor for S/Sx of Infection -Repeat CBC in the AM   Hyponatremia -Na+ Trend: Recent Labs  Lab 06/10/23 1125 06/11/23 0505 06/12/23 0527 06/13/23 0521  NA 137 134* 137 137  -Continue to Monitor and Trend and repeat CMP in the AM  Hypokalemia -Patient's K+ Level Trend: Recent Labs  Lab 06/10/23 1125 06/11/23 0505 06/12/23 0527 06/13/23 0521  K 3.1* 3.4* 4.0 3.6  -Replete with p.o. KCl 40 mEQ x1  -Continue to Monitor and Replete as Necessary -Repeat CMP in the AM   GERD/GI Prophylaxis -C/w Pantoprazole 40 mg po Daily   Class III (Superly Morbid) Obesity -Complicates overall prognosis and care -Estimated body mass index is 54.87 kg/m as calculated from the following:   Height as of this encounter: 5\' 7"  (1.702 m).   Weight as of this encounter: 158.9 kg.  -Weight Loss and Dietary Counseling given   DVT prophylaxis: SCDs Start: 06/10/23 1633    Code Status: Full Code Family Communication: No family present at bedside   Disposition Plan:  Level of care: Med-Surg Status is: Inpatient Remains inpatient appropriate because: Needs further clinical improvement and clearance by Pulmonary   Consultants:  Pulmonary   Procedures:  ECHOCARDIOGRAM CT Chest w/  Contrast   Antimicrobials:  Anti-infectives (From admission, onward)    Start     Dose/Rate Route Frequency Ordered Stop   06/11/23 1700  doxycycline (VIBRA-TABS) tablet 100 mg        100 mg Oral Every 12 hours 06/11/23 1555         Subjective: Seen and examined at bedside and still very dyspneic on exertion and felt very fatigued after she ambulated to the bathroom and had very difficult time catching her breath.  Also had a wet sounding cough.  No nausea or vomiting.  Just did not feel well and felt very weak.  Objective: Vitals:   06/12/23 2221 06/13/23 0507 06/13/23 0744 06/13/23 1300  BP:  (!) 166/70  (!) 149/60  Pulse: 72 68  73  Resp: 19 18  19   Temp:  98.8 F (37.1 C)  98.6 F (37 C)  TempSrc:  Oral  Oral  SpO2: 96% 93% 94% 97%  Weight:  (!) 158.9 kg    Height:        Intake/Output Summary (Last 24 hours) at 06/13/2023 1601 Last data filed at 06/13/2023 1313 Gross per 24 hour  Intake 120 ml  Output 1400 ml  Net -1280 ml   Filed Weights   06/10/23 1100 06/13/23 0507  Weight: (!) 145.2 kg Marland Kitchen)  158.9 kg   Examination: Physical Exam:  Constitutional: WN/WD morbidly obese African-American female in no acute distress but does appear fatigued  Respiratory: Diminished to auscultation bilaterally with some coarse breath sounds and does have some slight rhonchi and does have some wheezing but no appreciable rales and some slight crackles. Normal respiratory effort and patient is not tachypenic. No accessory muscle use.  Wearing supplemental oxygen via nasal cannula Cardiovascular: RRR, no murmurs / rubs / gallops. S1 and S2 auscultated.  Mild 1+ extremity edema Abdomen: Soft, non-tender, distended secondary to body habitus. Bowel sounds positive.  GU: Deferred. Musculoskeletal: No clubbing / cyanosis of digits/nails. No joint deformity upper and lower extremities.  Skin: No rashes, lesions, ulcers on limited skin evaluation. No induration; Warm and dry.  Neurologic: CN  2-12 grossly intact with no focal deficits. Romberg sign and cerebellar reflexes not assessed.  Psychiatric: Normal judgment and insight. Alert and oriented x 3. Normal mood and appropriate affect.   Data Reviewed: I have personally reviewed following labs and imaging studies  CBC: Recent Labs  Lab 06/10/23 1125 06/11/23 0505 06/12/23 0527 06/13/23 0521  WBC 4.2 4.5 10.7* 10.6*  NEUTROABS  --   --  8.6* 8.5*  HGB 15.8* 15.6* 14.7 15.1*  HCT 48.1* 46.9* 45.4 45.1  MCV 96.4 95.1 98.3 95.3  PLT 218 210 235 248   Basic Metabolic Panel: Recent Labs  Lab 06/10/23 1125 06/11/23 0505 06/12/23 0527 06/13/23 0521  NA 137 134* 137 137  K 3.1* 3.4* 4.0 3.6  CL 100 97* 102 100  CO2 24 25 24 27   GLUCOSE 103* 141* 126* 116*  BUN 11 19 27* 24*  CREATININE 0.82 0.79 0.79 0.77  CALCIUM 9.9 9.9 9.7 9.4  MG  --   --  2.2 2.1  PHOS  --   --  3.7 3.6   GFR: Estimated Creatinine Clearance: 127.5 mL/min (by C-G formula based on SCr of 0.77 mg/dL). Liver Function Tests: Recent Labs  Lab 06/10/23 1125 06/12/23 0527 06/13/23 0521  AST 38 18 15  ALT 42 30 28  ALKPHOS 80 74 67  BILITOT 0.5 0.4 0.3  PROT 8.1 7.5 7.3  ALBUMIN 4.0 3.7 3.6   No results for input(s): "LIPASE", "AMYLASE" in the last 168 hours. No results for input(s): "AMMONIA" in the last 168 hours. Coagulation Profile: No results for input(s): "INR", "PROTIME" in the last 168 hours. Cardiac Enzymes: No results for input(s): "CKTOTAL", "CKMB", "CKMBINDEX", "TROPONINI" in the last 168 hours. BNP (last 3 results) No results for input(s): "PROBNP" in the last 8760 hours. HbA1C: No results for input(s): "HGBA1C" in the last 72 hours. CBG: Recent Labs  Lab 06/13/23 0640  GLUCAP 118*   Lipid Profile: No results for input(s): "CHOL", "HDL", "LDLCALC", "TRIG", "CHOLHDL", "LDLDIRECT" in the last 72 hours. Thyroid Function Tests: No results for input(s): "TSH", "T4TOTAL", "FREET4", "T3FREE", "THYROIDAB" in the last 72  hours. Anemia Panel: No results for input(s): "VITAMINB12", "FOLATE", "FERRITIN", "TIBC", "IRON", "RETICCTPCT" in the last 72 hours. Sepsis Labs: No results for input(s): "PROCALCITON", "LATICACIDVEN" in the last 168 hours.  Recent Results (from the past 240 hours)  Resp panel by RT-PCR (RSV, Flu A&B, Covid) Anterior Nasal Swab     Status: None   Collection Time: 06/10/23  2:26 PM   Specimen: Anterior Nasal Swab  Result Value Ref Range Status   SARS Coronavirus 2 by RT PCR NEGATIVE NEGATIVE Final    Comment: (NOTE) SARS-CoV-2 target nucleic acids are NOT DETECTED.  The SARS-CoV-2 RNA is generally detectable in upper respiratory specimens during the acute phase of infection. The lowest concentration of SARS-CoV-2 viral copies this assay can detect is 138 copies/mL. A negative result does not preclude SARS-Cov-2 infection and should not be used as the sole basis for treatment or other patient management decisions. A negative result may occur with  improper specimen collection/handling, submission of specimen other than nasopharyngeal swab, presence of viral mutation(s) within the areas targeted by this assay, and inadequate number of viral copies(<138 copies/mL). A negative result must be combined with clinical observations, patient history, and epidemiological information. The expected result is Negative.  Fact Sheet for Patients:  BloggerCourse.com  Fact Sheet for Healthcare Providers:  SeriousBroker.it  This test is no t yet approved or cleared by the [place] FDA and  has been authorized for detection and/or diagnosis of SARS-CoV-2 by FDA under an Emergency Use Authorization (EUA). This EUA will remain  in effect (meaning this test can be used) for the duration of the COVID-19 declaration under Section 564(b)(1) of the Act, 21 U.S.C.section 360bbb-3(b)(1), unless the authorization is terminated  or revoked sooner.        Influenza A by PCR NEGATIVE NEGATIVE Final   Influenza B by PCR NEGATIVE NEGATIVE Final    Comment: (NOTE) The Xpert Xpress SARS-CoV-2/FLU/RSV plus assay is intended as an aid in the diagnosis of influenza from Nasopharyngeal swab specimens and should not be used as a sole basis for treatment. Nasal washings and aspirates are unacceptable for Xpert Xpress SARS-CoV-2/FLU/RSV testing.  Fact Sheet for Patients: BloggerCourse.com  Fact Sheet for Healthcare Providers: SeriousBroker.it  This test is not yet approved or cleared by the [place] FDA and has been authorized for detection and/or diagnosis of SARS-CoV-2 by FDA under an Emergency Use Authorization (EUA). This EUA will remain in effect (meaning this test can be used) for the duration of the COVID-19 declaration under Section 564(b)(1) of the Act, 21 U.S.C. section 360bbb-3(b)(1), unless the authorization is terminated or revoked.     Resp Syncytial Virus by PCR NEGATIVE NEGATIVE Final    Comment: (NOTE) Fact Sheet for Patients: BloggerCourse.com  Fact Sheet for Healthcare Providers: SeriousBroker.it  This test is not yet approved or cleared by the [place] FDA and has been authorized for detection and/or diagnosis of SARS-CoV-2 by FDA under an Emergency Use Authorization (EUA). This EUA will remain in effect (meaning this test can be used) for the duration of the COVID-19 declaration under Section 564(b)(1) of the Act, 21 U.S.C. section 360bbb-3(b)(1), unless the authorization is terminated or revoked.  Performed at [facility], [address] [place], [place] [address]   Respiratory (~20 pathogens) panel by PCR     Status: None   Collection Time: 06/11/23  3:00 PM  Result Value Ref Range Status   Adenovirus NOT DETECTED NOT DETECTED Final   Coronavirus 229E NOT DETECTED NOT DETECTED  Final    Comment: (NOTE) The Coronavirus on the Respiratory Panel, DOES NOT test for the novel  Coronavirus (2019 nCoV)    Coronavirus HKU1 NOT DETECTED NOT DETECTED Final   Coronavirus NL63 NOT DETECTED NOT DETECTED Final   Coronavirus OC43 NOT DETECTED NOT DETECTED Final   Metapneumovirus NOT DETECTED NOT DETECTED Final   Rhinovirus / Enterovirus NOT DETECTED NOT DETECTED Final   Influenza A NOT DETECTED NOT DETECTED Final   Influenza B NOT DETECTED NOT DETECTED Final   Parainfluenza Virus 1 NOT DETECTED NOT DETECTED Final  Parainfluenza Virus 2 NOT DETECTED NOT DETECTED Final   Parainfluenza Virus 3 NOT DETECTED NOT DETECTED Final   Parainfluenza Virus 4 NOT DETECTED NOT DETECTED Final   Respiratory Syncytial Virus NOT DETECTED NOT DETECTED Final   Bordetella pertussis NOT DETECTED NOT DETECTED Final   Bordetella Parapertussis NOT DETECTED NOT DETECTED Final   Chlamydophila pneumoniae NOT DETECTED NOT DETECTED Final   Mycoplasma pneumoniae NOT DETECTED NOT DETECTED Final    Comment: Performed at [facility], [address], [place], [place] [address]    Radiology Studies: CT CHEST W CONTRAST Result Date: 06/13/2023 CLINICAL DATA:  Pneumonia, complication suspected, xray done EXAM: CT CHEST WITH CONTRAST TECHNIQUE: Multidetector CT imaging of the chest was performed during intravenous contrast administration. RADIATION DOSE REDUCTION: This exam was performed according to the departmental dose-optimization program which includes automated exposure control, adjustment of the mA and/or kV according to patient size and/or use of iterative reconstruction technique. CONTRAST:  75mL OMNIPAQUE IOHEXOL 300 MG/ML  SOLN COMPARISON:  X-ray 06/13/2023, CT 10/12/2022 FINDINGS: Cardiovascular: Heart size is normal. No pericardial effusion. Thoracic aorta is nonaneurysmal. Central pulmonary vasculature is within normal limits. Mediastinum/Nodes: No enlarged mediastinal, hilar, or axillary lymph  nodes. Thyroid gland, trachea, and esophagus demonstrate no significant findings. Lungs/Pleura: Mild subsegmental atelectasis within the lingula and medial right lung base. There are a few subtle areas of peripheral ground-glass opacity within the right upper lobe and to a lesser degree within the periphery of the left upper lobe. No pleural effusion. No pneumothorax. Upper Abdomen: Small diverticulum arising from the posterior aspect of the gastric fundus. No acute abnormality. Musculoskeletal: No chest wall abnormality. No acute or significant osseous findings. IMPRESSION: 1. There are a few subtle areas of peripheral ground-glass opacity within the right upper lobe and to a lesser degree within the periphery of the left upper lobe. Findings are nonspecific but may represent a mild infectious or inflammatory process. 2. Mild subsegmental atelectasis within the lingula and medial right lung base. Electronically Signed   By: Duanne Guess D.O.   On: 06/13/2023 14:23   DG CHEST PORT 1 VIEW Result Date: 06/13/2023 CLINICAL DATA:  52 year old female with shortness of breath. EXAM: PORTABLE CHEST 1 VIEW COMPARISON:  Portable chest yesterday and earlier. FINDINGS: Portable AP semi upright view at 0453 hours. Stable cardiomegaly and mediastinal contours. Stable pulmonary vascular congestion. Visualized tracheal air column is within normal limits. Stable mild thoracic aorta tortuosity, mediastinal contours. No pneumothorax, pleural effusion or confluent opacity. No acute osseous abnormality identified. Paucity of bowel gas in the visible abdomen. IMPRESSION: Stable symmetric pulmonary interstitial opacity which could be interstitial edema, viral or atypical respiratory infection. Electronically Signed   By: Odessa Fleming M.D.   On: 06/13/2023 08:24   DG CHEST PORT 1 VIEW Result Date: 06/12/2023 CLINICAL DATA:  [id number] SOB (shortness of breath) [id number] EXAM: PORTABLE CHEST 1 VIEW COMPARISON:  June 10, 2023 FINDINGS:  Incomplete assessment of the costophrenic angles. The cardiomediastinal silhouette is unchanged and enlarged in contour. Possible small RIGHT pleural effusion. No pneumothorax. Favored overall increased interstitial markings with peribronchial cuffing and vascular indistinctness. IMPRESSION: Constellation of findings are favored to reflect pulmonary edema. Electronically Signed   By: Meda Klinefelter M.D.   On: 06/12/2023 07:58   Scheduled Meds:  amLODipine  5 mg Oral QHS   arformoterol  15 mcg Nebulization BID   azelastine  2 spray Each Nare BID   budesonide (PULMICORT) nebulizer solution  0.25 mg Nebulization BID  chlorpheniramine-HYDROcodone  5 mL Oral Q12H   doxycycline  100 mg Oral Q12H   DULoxetine  60 mg Oral Daily   enoxaparin (LOVENOX) injection  70 mg Subcutaneous Q24H   guaiFENesin  1,200 mg Oral BID   ipratropium  0.5 mg Nebulization TID   labetalol  100 mg Oral BID   levalbuterol  0.63 mg Nebulization TID   methylPREDNISolone (SOLU-MEDROL) injection  40 mg Intravenous Q12H   montelukast  10 mg Oral QHS   nicotine  7 mg Transdermal Daily   pantoprazole  40 mg Oral BID   polyethylene glycol  17 g Oral BID   potassium chloride  40 mEq Oral Once   pravastatin  40 mg Oral Daily   senna-docusate  1 tablet Oral BID   spironolactone  25 mg Oral Daily   traZODone  100 mg Oral QHS   Continuous Infusions:   LOS: 2 days   Marguerita Merles, DO [facility] Available via Epic secure chat 7am-7pm After these hours, please refer to coverage provider listed on [email or web address] 06/13/2023, 4:01 PM

## 2023-06-13 NOTE — Progress Notes (Signed)
  Echocardiogram 2D Echocardiogram has been performed.  Ocie Doyne RDCS 06/13/2023, 3:13 PM

## 2023-06-13 NOTE — Consult Note (Addendum)
NAME:  Judy Baker, MRN:  [id number], DOB:  [birth year], LOS: 2 ADMISSION DATE:  06/10/2023, CONSULTATION DATE:  12/12 REFERRING MD:  Marland Mcalpine, CHIEF COMPLAINT:  shortness of breath and cough    History of Present Illness:  52 year year old female w/ h/o tobacco abuse, OSA, HTN, angina., asthma. Presented to ER 12/10 w/ 5-6d h/o increased nasal congestion, prod cough w/ greay sputum and worsening shortness of breath and chest tightness.   Room air sats 80s on arrival to ER. PCXR w/ increased interstitial markings.   Admitted w/ working dx of AECOPD. Placed on oxygen, nebs, systemic steroids and supportive care.  Given CXR findings raising concern for edema was also administered lasix on 12/12. ECHO ordered and pending.  BNP was 72, RVP was negative, influenza and covid neg, HIV AB neg  Pertinent  Medical History  OSA, obesity,  hypertension, GERD, tobacco abuse, HL, asthma   Significant Hospital Events: Including procedures, antibiotic start and stop dates in addition to other pertinent events   12/11 admitted started on Doxy, systemic steroids and nebs 12/12 got lasix for possible component of edema 12/13 pccm consulted ECHO pending CT pending no clear PE  Interim History / Subjective:  Still very SOB   Objective   Blood pressure (!) 166/70, pulse 68, temperature 98.8 F (37.1 C), temperature source Oral, resp. rate 18, height 5\' 7"  (1.702 m), weight (!) 158.9 kg, SpO2 94%.        Intake/Output Summary (Last 24 hours) at 06/13/2023 1113 Last data filed at 06/13/2023 0900 Gross per 24 hour  Intake 120 ml  Output 1350 ml  Net -1230 ml   Filed Weights   06/10/23 1100 06/13/23 0507  Weight: (!) 145.2 kg (!) 158.9 kg    Examination: General: 52 year old female laying in bed no distress HENT: NCAT MMM no JVD ++ UAW wheezing Lungs: distant wheeze mostly upper airway  Cardiovascular: rrr Abdomen: soft  Extremities: no sig edema  Neuro: awake no focal def  GU: voids    Resolved Hospital Problem list     Assessment & Plan:  Acute hypoxic resp failure Acute bronchitis AECOPD  Copd/asthma overlap  laryngopharyngeal reflux disease Tobacco abuse (stopped 2 weeks ago)  OSA (compliant w/ CPAP)  HTN GERD   Acute hypoxic resp failure 2/2 acute bronchitis and AECOPD and complicated further by laryngopharyngeal reflux disease.  Upper airway noises still fairly pronounced  w/ hoarse voice and wet cough. Think the majority of her residual symptom burden is from her upper airway  Plan Cont supplemental oxygen  Increase PPI Add nasal H2B Added low dose tussinex to assist w/ cough Reflux precautions  Cont systemic steroids  Cont scheduled  bds Day 3 doxy  Best Practice (right click and "Reselect all SmartList Selections" daily)  Per primary   Labs   CBC: Recent Labs  Lab 06/10/23 1125 06/11/23 0505 06/12/23 0527 06/13/23 0521  WBC 4.2 4.5 10.7* 10.6*  NEUTROABS  --   --  8.6* 8.5*  HGB 15.8* 15.6* 14.7 15.1*  HCT 48.1* 46.9* 45.4 45.1  MCV 96.4 95.1 98.3 95.3  PLT 218 210 235 248    Basic Metabolic Panel: Recent Labs  Lab 06/10/23 1125 06/11/23 0505 06/12/23 0527 06/13/23 0521  NA 137 134* 137 137  K 3.1* 3.4* 4.0 3.6  CL 100 97* 102 100  CO2 24 25 24 27   GLUCOSE 103* 141* 126* 116*  BUN 11 19 27* 24*  CREATININE 0.82 0.79 0.79 0.77  CALCIUM 9.9 9.9 9.7 9.4  MG  --   --  2.2 2.1  PHOS  --   --  3.7 3.6   GFR: Estimated Creatinine Clearance: 127.5 mL/min (by C-G formula based on SCr of 0.77 mg/dL). Recent Labs  Lab 06/10/23 1125 06/11/23 0505 06/12/23 0527 06/13/23 0521  WBC 4.2 4.5 10.7* 10.6*    Liver Function Tests: Recent Labs  Lab 06/10/23 1125 06/12/23 0527 06/13/23 0521  AST 38 18 15  ALT 42 30 28  ALKPHOS 80 74 67  BILITOT 0.5 0.4 0.3  PROT 8.1 7.5 7.3  ALBUMIN 4.0 3.7 3.6   No results for input(s): "LIPASE", "AMYLASE" in the last 168 hours. No results for input(s): "AMMONIA" in the last 168  hours.  ABG    Component Value Date/Time   HCO3 27.8 06/10/2023 1426   TCO2 31 10/11/2022 2143   O2SAT 87.4 06/10/2023 1426     Coagulation Profile: No results for input(s): "INR", "PROTIME" in the last 168 hours.  Cardiac Enzymes: No results for input(s): "CKTOTAL", "CKMB", "CKMBINDEX", "TROPONINI" in the last 168 hours.  HbA1C: No results found for: "HGBA1C"  CBG: Recent Labs  Lab 06/13/23 0640  GLUCAP 118*    Review of Systems:   Review of Systems  Constitutional:  Positive for malaise/fatigue. Negative for fever.  HENT:  Positive for congestion, sinus pain and sore throat.   Eyes: Negative.   Respiratory:  Positive for cough, sputum production, shortness of breath and wheezing.   Cardiovascular: Negative.   Gastrointestinal: Negative.   Genitourinary: Negative.   Musculoskeletal: Negative.   Skin: Negative.   Endo/Heme/Allergies: Negative.      Past Medical History:  She,  has a past medical history of Arthritis, Asthma, Carpal tunnel syndrome, GERD (gastroesophageal reflux disease) (02/04/2019), HLD (hyperlipidemia) (02/04/2019), HTN (hypertension) (02/04/2019), Hypertension, and Osteoarthritis.   Surgical History:   Past Surgical History:  Procedure Laterality Date   BREAST BIOPSY Right 12/21/2018   [facility]   BREAST SURGERY     CARPAL TUNNEL RELEASE     CHOLECYSTECTOMY     LEFT HEART CATH AND CORONARY ANGIOGRAPHY N/A 03/30/2019   Procedure: LEFT HEART CATH AND CORONARY ANGIOGRAPHY;  Surgeon: Yates Decamp, MD;  Location: [facility];  Service: Cardiovascular;  Laterality: N/A;   LEFT HEART CATH AND CORONARY ANGIOGRAPHY N/A 02/21/2021   Procedure: LEFT HEART CATH AND CORONARY ANGIOGRAPHY;  Surgeon: Elder Negus, MD;  Location: [facility];  Service: Cardiovascular;  Laterality: N/A;   TUBAL LIGATION       Social History:   reports that she has been smoking cigarettes. She has a 5 pack-year smoking history. She has been exposed to  tobacco smoke. She has never used smokeless tobacco. She reports current alcohol use. She reports that she does not use drugs.   Family History:  Her family history includes Aneurysm in her mother; Cardiomyopathy in her brother; Heart murmur in her sister; Stroke in her father; Valvular heart disease in her mother.   Allergies Allergies  Allergen Reactions   Molds & Smuts    Grass Pollen(K-O-R-T-Swt Vern)     Allscripts Description: Grass   Penicillins Hives and Swelling    Did it involve swelling of the face/tongue/throat, SOB, or low BP? Yes Did it involve sudden or severe rash/hives, skin peeling, or any reaction on the inside of your mouth or nose? Yes Did you need to seek medical attention at a hospital or doctor's office? Yes When did it  last happen? childhood (Teenage) allergy  If all above answers are "NO", may proceed with cephalosporin use.    Sulfa Antibiotics Swelling     Home Medications  Prior to Admission medications   Medication Sig Start Date End Date Taking? Authorizing Provider  acetaminophen (TYLENOL) 500 MG tablet Take 2 tablets (1,000 mg total) by mouth every 6 (six) hours as needed. 10/12/22  Yes Fondaw, Wylder S, PA  albuterol (PROVENTIL) (2.5 MG/3ML) 0.083% nebulizer solution Inhale 3 mLs (2.5 mg total) into the lungs every 6 (six) hours as needed for wheezing or shortness of breath. 03/31/23 06/29/23 Yes Rucker, Magdalen Spatz, MD  albuterol (VENTOLIN HFA) 108 (90 Base) MCG/ACT inhaler Inhale 2 puffs into the lungs every 6 (six) hours as needed for wheezing or shortness of breath.   Yes [provider]  amLODipine (NORVASC) 5 MG tablet Take 5 mg by mouth at bedtime.   Yes [provider]  azelastine (ASTELIN) 0.1 % nasal spray Place 2 sprays into both nostrils 2 (two) times daily as needed (allergies).  12/21/18  Yes [provider]  budesonide-formoterol (SYMBICORT) 160-4.5 MCG/ACT inhaler Inhale 2 puffs into the lungs in the morning and at  bedtime.   Yes [provider]  busPIRone (BUSPAR) 10 MG tablet Take 1 tablet (10 mg total) by mouth 2 (two) times daily. 03/13/23  Yes Rucker, Magdalen Spatz, MD  DULoxetine (CYMBALTA) 60 MG capsule TAKE 1 CAPSULE BY MOUTH EVERY DAY 04/22/23  Yes Rucker, Magdalen Spatz, MD  fluticasone (FLONASE) 50 MCG/ACT nasal spray Place 1 spray into both nostrils 2 (two) times daily as needed for allergies.  12/21/18  Yes [provider]  furosemide (LASIX) 20 MG tablet Take 20 mg by mouth daily as needed for fluid.   Yes [provider]  HYDROcodone-acetaminophen (NORCO) 10-325 MG tablet Take 1 tablet by mouth 2 (two) times daily as needed for severe pain (pain score 7-10).   Yes [provider]  labetalol (NORMODYNE) 100 MG tablet TAKE 1 TABLET TWICE A DAY (HOLD IF TOP BLOOD PRESSURE NUMBER IS LESS THAN 100 OR PULSE LESS THAN 60) 10/14/22  Yes Tolia, Sunit, DO  montelukast (SINGULAIR) 10 MG tablet Take 10 mg by mouth at bedtime.   Yes [provider]  Multiple Vitamins-Minerals (CENTRUM SILVER 50+WOMEN) TABS Take 1 tablet by mouth daily.   Yes [provider]  nicotine (NICODERM CQ - DOSED IN MG/24 HR) 7 mg/24hr patch Place 1 patch (7 mg total) onto the skin daily. 03/13/23  Yes Rucker, Magdalen Spatz, MD  nitroGLYCERIN (NITROSTAT) 0.4 MG SL tablet PLACE 1 TABLET UNDER THE TONGUE EVERY 5 MINUTES AS NEEDED FOR CHEST PAIN. 03/07/22  Yes Tolia, Sunit, DO  omeprazole (PRILOSEC) 40 MG capsule Take 1 capsule (40 mg total) by mouth daily. 03/13/23  Yes Rucker, Magdalen Spatz, MD  pravastatin (PRAVACHOL) 40 MG tablet Take 1 tablet (40 mg total) by mouth daily. 04/17/23  Yes Rucker, Magdalen Spatz, MD  spironolactone-hydrochlorothiazide (ALDACTAZIDE) 25-25 MG tablet Take 1 tablet by mouth daily. Restarted on 01/31/20 01/24/20  Yes [provider]  tiZANidine (ZANAFLEX) 4 MG tablet Take 4 mg by mouth every 8 (eight) hours as needed for muscle spasms. 02/11/23  Yes [provider]   traMADol (ULTRAM) 50 MG tablet Take 50 mg by mouth every 12 (twelve) hours as needed for moderate pain (pain score 4-6).   Yes [provider]  traZODone (DESYREL) 100 MG tablet Take 1 tablet (100 mg total) by mouth at bedtime.  03/13/23  Yes Rucker, Magdalen Spatz, MD  zolpidem (AMBIEN) 10 MG tablet Take 10 mg by mouth at bedtime as needed for sleep. 09/19/20  Yes [provider]  cyclobenzaprine (FLEXERIL) 10 MG tablet Take 10 mg by mouth 2 (two) times daily as needed for muscle spasms. Patient not taking: Reported on 06/10/2023 06/02/23   [provider]  levonorgestrel (MIRENA) 20 MCG/DAY IUD by Intrauterine route. 05/10/20 05/02/27  [provider]  methylPREDNISolone (MEDROL DOSEPAK) 4 MG TBPK tablet Take 4 mg by mouth daily. Patient not taking: Reported on 06/10/2023 06/02/23   [provider]     Critical care time: NA

## 2023-06-14 ENCOUNTER — Inpatient Hospital Stay (HOSPITAL_COMMUNITY): Payer: Medicaid Other

## 2023-06-14 DIAGNOSIS — J441 Chronic obstructive pulmonary disease with (acute) exacerbation: Secondary | ICD-10-CM | POA: Diagnosis not present

## 2023-06-14 DIAGNOSIS — D72829 Elevated white blood cell count, unspecified: Secondary | ICD-10-CM | POA: Diagnosis not present

## 2023-06-14 DIAGNOSIS — J9601 Acute respiratory failure with hypoxia: Secondary | ICD-10-CM | POA: Diagnosis not present

## 2023-06-14 DIAGNOSIS — E876 Hypokalemia: Secondary | ICD-10-CM | POA: Diagnosis not present

## 2023-06-14 LAB — CBC WITH DIFFERENTIAL/PLATELET
Abs Immature Granulocytes: 0.08 10*3/uL — ABNORMAL HIGH (ref 0.00–0.07)
Basophils Absolute: 0 10*3/uL (ref 0.0–0.1)
Basophils Relative: 0 %
Eosinophils Absolute: 0 10*3/uL (ref 0.0–0.5)
Eosinophils Relative: 0 %
HCT: 45.7 % (ref 36.0–46.0)
Hemoglobin: 15 g/dL (ref 12.0–15.0)
Immature Granulocytes: 1 %
Lymphocytes Relative: 20 %
Lymphs Abs: 2.4 10*3/uL (ref 0.7–4.0)
MCH: 31.6 pg (ref 26.0–34.0)
MCHC: 32.8 g/dL (ref 30.0–36.0)
MCV: 96.4 fL (ref 80.0–100.0)
Monocytes Absolute: 1.4 10*3/uL — ABNORMAL HIGH (ref 0.1–1.0)
Monocytes Relative: 12 %
Neutro Abs: 7.9 10*3/uL — ABNORMAL HIGH (ref 1.7–7.7)
Neutrophils Relative %: 67 %
Platelets: 244 10*3/uL (ref 150–400)
RBC: 4.74 MIL/uL (ref 3.87–5.11)
RDW: 12.9 % (ref 11.5–15.5)
WBC: 11.8 10*3/uL — ABNORMAL HIGH (ref 4.0–10.5)
nRBC: 0 % (ref 0.0–0.2)

## 2023-06-14 LAB — COMPREHENSIVE METABOLIC PANEL
ALT: 31 U/L (ref 0–44)
AST: 17 U/L (ref 15–41)
Albumin: 3.4 g/dL — ABNORMAL LOW (ref 3.5–5.0)
Alkaline Phosphatase: 66 U/L (ref 38–126)
Anion gap: 6 (ref 5–15)
BUN: 26 mg/dL — ABNORMAL HIGH (ref 6–20)
CO2: 29 mmol/L (ref 22–32)
Calcium: 9.1 mg/dL (ref 8.9–10.3)
Chloride: 100 mmol/L (ref 98–111)
Creatinine, Ser: 0.84 mg/dL (ref 0.44–1.00)
GFR, Estimated: >60 mL/min (ref >60)
Glucose, Bld: 107 mg/dL — ABNORMAL HIGH (ref 70–99)
Potassium: 3.7 mmol/L (ref 3.5–5.1)
Sodium: 135 mmol/L (ref 135–145)
Total Bilirubin: 0.5 mg/dL (ref <1.2)
Total Protein: 6.9 g/dL (ref 6.5–8.1)

## 2023-06-14 LAB — MAGNESIUM: Magnesium: 2 mg/dL (ref 1.7–2.4)

## 2023-06-14 LAB — PHOSPHORUS: Phosphorus: 3.5 mg/dL (ref 2.5–4.6)

## 2023-06-14 MED ORDER — BENZONATATE 100 MG PO CAPS
200.0000 mg | ORAL_CAPSULE | Freq: Three times a day (TID) | ORAL | Status: DC
  Administered 2023-06-14 – 2023-06-18 (×11): 200 mg via ORAL
  Filled 2023-06-14 (×12): qty 2

## 2023-06-14 MED ORDER — FUROSEMIDE 10 MG/ML IJ SOLN
40.0000 mg | Freq: Once | INTRAMUSCULAR | Status: AC
  Administered 2023-06-14: 40 mg via INTRAVENOUS
  Filled 2023-06-14: qty 4

## 2023-06-14 MED ORDER — HYDROCOD POLI-CHLORPHE POLI ER 10-8 MG/5ML PO SUER
5.0000 mL | Freq: Two times a day (BID) | ORAL | Status: DC | PRN
  Administered 2023-06-15 – 2023-06-16 (×3): 5 mL via ORAL
  Filled 2023-06-14 (×3): qty 5

## 2023-06-14 MED ORDER — ENOXAPARIN SODIUM 80 MG/0.8ML IJ SOSY
80.0000 mg | PREFILLED_SYRINGE | INTRAMUSCULAR | Status: DC
  Administered 2023-06-14 – 2023-06-15 (×2): 80 mg via SUBCUTANEOUS
  Filled 2023-06-14 (×2): qty 0.8

## 2023-06-14 NOTE — Progress Notes (Signed)
PROGRESS NOTE    Judy Baker  ZOX:[id number] DOB: [birth year] DOA: 06/10/2023 PCP: Suzan Slick, MD   Brief Narrative:  The patient is a 52 year old African-American female with a past medical history significant for vomiting hypertension, hyperlipidemia, GERD, tobacco abuse, obesity as well as other comorbidities including asthma who was admitted to the hospital with suspected exacerbation of acute COPD with acute respiratory failure with hypoxia.  She presented with a cough and shortness of breath that been getting worse with some congestion.  Cough was productive with gray sputum and she had chest tightness for the last 5 to 6 days.  She does not wear oxygen at baseline and she was transferred to the ED and given nebulized inhaler, Solu-Medrol, Zofran.  She is also placed on 2 L supplemental oxygen nasal cannula and she was noted to desaturate in the mid 80s on room air.  Currently she still feels wheezy and tight in her chest and very Dyspenic on Exertion.   Given CXR findings started gentle diuresis and obtained ECHOCardiogram.  And will also obtain a CT scan of the chest with contrast and obtain pulmonary consultation.  Echocardiogram i finally read and showed normal EF but did show that the left ventricular diastolic parameters are indeterminate.  Pulmonary recommends to continue current regimen and agrees with gentle diuresis.  Assessment and Plan:  Acute Exacerbation of COPD -In the setting of ongoing tobacco abuse, increased cough, chest tightness, and wheezing.  No clear inciting factor, respiratory virus panel is negative. -Observation admission changed to Inpatient  -Supplemental oxygen to keep O2 saturation greater than 90% -Change DuoNebs scheduled to Xopenex and Atrovent scheduled and add budesonide Brovana -C/w IV Solu-Medrol 60 mg twice daily  ABG    Component Value Date/Time   HCO3 27.8 06/10/2023 1426   TCO2 31 10/11/2022 2143   O2SAT 87.4 06/10/2023 1426   -Treatment as below -Added Doxycycline 100 mg po BID -Respiratory Virus Panel Negative -See below for further treatment  Acute Respiratory Failure with Hypoxia -Likely due to COPD exacerbation, though she does not have respiratory acidosis. ? Component of CHF  -Added budesonide 0.25 mg nebs twice daily as well as arformoterol 15 mcg nebs twice daily -Continue with Xopenex and Atrovent every 6 scheduled and increase Solu-Medrol dose to 60 mg every 12 -Continue with flutter valve, incentive spirometry and guaifenesin 1200 mL p.o. twice daily -Low suspicion for PE, but will obtain D-dimer to rule it out and this was less than 0.27 SpO2: 97 % O2 Flow Rate (L/min): 2 L/min -Checked respiratory virus panel and was negative as well as influenza A/B, RSV and SARS-CoV-2 were negative -Check BNP and was 72.3 but not as reliable in obese  -Check ECHOCardiogram and showed "Left ventricular ejection fraction, by estimation, is 55%. The left ventricle has normal function. Left ventricular endocardial border not optimally defined to evaluate regional wall motion. There is mild left ventricular hypertrophy. Left ventricular diastolic parameters are indeterminate. Right ventricular systolic function is normal " -C/w IV Lasix and will give her IV 40 mg x1 again (received a dose yesterday)  -CXR findings yesterday showing "Stable cardiomediastinal silhouette with mild cardiomegaly. No pneumothorax. No significant pleural effusions, noting exclusion of portions of the costophrenic angles from the image. No overt pulmonary edema. No consolidative airspace disease.." -Continuous supplemental oxygen via nasal cannula wean O2 as tolerated -Continuous pulse oximetry maintain O2 saturations greater than 90% -Will need an ambulatory home O2 screen and repeat chest x-ray in the a.m. -Given her  lack of improvement and significant dyspnea we will check a CT scan of the chest with contrast which showed "There are a few  subtle areas of peripheral ground-glass opacity within the right upper lobe and to a lesser degree within the periphery of the left upper lobe. Findings are nonspecific but may represent a mild infectious or inflammatory process. Mild subsegmental atelectasis within the lingula and medial right lung base." -Pulmonary consulted for further evaluation and recommendations and they are recommending continue supplemental oxygen, increasing her PPI and adding a nasal H2 blocker and also adding low-dose Tussionex to assist with cough as well as reflux precaution -Pulmonary now also adding Tessalon Perles and agreeing with diuresis and recommending weaning O2 for an O2 saturation for goal greater than 88%   Tobacco Abuse -Smoking cessation counseling given -Continue with nicotine patch 7 mg Transdermally every 24   Hypertension -Currently on Amlodipine 5 mg po qHS and will continue Spirolactone 25 mg po Daily and will hold hydrochlorothiazide 25 mg po Daily  -Continue to monitor blood pressures per protocol -Continue with labetalol 100 mg p.o. twice daily -Last blood pressure reading was 140/76   Hyperlipidemia -Continue Pravastatin 40 mg po Daily    Depression -C/w Duloxetine 60 mg po Daily   Leukocytosis -Mild and WBC Trend: Recent Labs  Lab 06/10/23 1125 06/11/23 0505 06/12/23 0527 06/13/23 0521 06/14/23 0632  WBC 4.2 4.5 10.7* 10.6* 11.8*  -In the setting of Steroid Demargination -Continue to Monitor for S/Sx of Infection -Repeat CBC in the AM   Hyponatremia -Na+ Trend: Recent Labs  Lab 06/10/23 1125 06/11/23 0505 06/12/23 0527 06/13/23 0521 06/14/23 0632  NA 137 134* 137 137 135  -Continue to Monitor and Trend and repeat CMP in the AM  Hypokalemia -Patient's K+ Level Trend: Recent Labs  Lab 06/10/23 1125 06/11/23 0505 06/12/23 0527 06/13/23 0521 06/14/23 0632  K 3.1* 3.4* 4.0 3.6 3.7  -Continue to Monitor and Replete as Necessary -Repeat CMP in the AM    GERD/GI Prophylaxis -C/w Pantoprazole 40 mg po Daily   Hypoalbuminemia -Patient's Albumin Trend: Recent Labs  Lab 06/10/23 1125 06/12/23 0527 06/13/23 0521 06/14/23 0632  ALBUMIN 4.0 3.7 3.6 3.4*  -Continue to Monitor and Trend and repeat CMP in the AM  Class III (Superly Morbid) Obesity -Complicates overall prognosis and care -Estimated body mass index is 54.97 kg/m as calculated from the following:   Height as of this encounter: 5\' 7"  (1.702 m).   Weight as of this encounter: 159.2 kg.  -Weight Loss and Dietary Counseling given   DVT prophylaxis: SCDs Start: 06/10/23 1633    Code Status: Full Code Family Communication: No family currently at bedside  Disposition Plan:  Level of care: Med-Surg Status is: Inpatient Remains inpatient appropriate because: Needs further clinical improvement and clearance by pulmonary  Consultants:  Pulmonary  Procedures:  ECHOCARDIOGRAM CT Chest w/ Contrast  Antimicrobials:  Anti-infectives (From admission, onward)    Start     Dose/Rate Route Frequency Ordered Stop   06/11/23 1700  doxycycline (VIBRA-TABS) tablet 100 mg        100 mg Oral Every 12 hours 06/11/23 1555         Subjective: Seen and examined at bedside and continues to be dyspneic on exertion and feels very fatigued.  Continues to wheeze.  Thinks he is doing little bit better this morning compared to yesterday.  No nausea or vomiting.  No other concerns or complaints this time.  Objective: Vitals:   06/13/23  2032 06/14/23 0500 06/14/23 0509 06/14/23 0816  BP: (!) 151/52  (!) 140/76   Pulse: 72  (!) 57   Resp: 20  20   Temp: 98.4 F (36.9 C)  98.2 F (36.8 C)   TempSrc: Oral  Oral   SpO2: 98%  96% 97%  Weight:  (!) 159.2 kg    Height:        Intake/Output Summary (Last 24 hours) at 06/14/2023 1300 Last data filed at 06/14/2023 0825 Gross per 24 hour  Intake 240 ml  Output 1552 ml  Net -1312 ml   Filed Weights   06/10/23 1100 06/13/23 0507  06/14/23 0500  Weight: (!) 145.2 kg (!) 158.9 kg (!) 159.2 kg   Examination: Physical Exam:  Constitutional: WN/WD morbidly obese African-American female in no acute distress but appears a little fatigued Respiratory: Diminished to auscultation bilaterally with some coarse breath sounds and does have some slight rhonchi but does also have some wheezing but no appreciable rales.  Has some slight crackles normal respiratory effort and patient is not tachypenic. No accessory muscle use.  Wearing supplemental oxygen via nasal cannula Cardiovascular: RRR, no murmurs / rubs / gallops. S1 and S2 auscultated.  Mild extremity edema Abdomen: Soft, non-tender, distended secondary body habitus. Bowel sounds positive.  GU: Deferred. Musculoskeletal: No clubbing / cyanosis of digits/nails. No joint deformity upper and lower extremities.  Skin: No rashes, lesions, ulcers on limited skin evaluation. No induration; Warm and dry.  Neurologic: CN 2-12 grossly intact with no focal deficits. Romberg sign and cerebellar reflexes not assessed.  Psychiatric: Normal judgment and insight. Alert and oriented x 3. Normal mood and appropriate affect.   Data Reviewed: I have personally reviewed following labs and imaging studies  CBC: Recent Labs  Lab 06/10/23 1125 06/11/23 0505 06/12/23 0527 06/13/23 0521 06/14/23 0632  WBC 4.2 4.5 10.7* 10.6* 11.8*  NEUTROABS  --   --  8.6* 8.5* 7.9*  HGB 15.8* 15.6* 14.7 15.1* 15.0  HCT 48.1* 46.9* 45.4 45.1 45.7  MCV 96.4 95.1 98.3 95.3 96.4  PLT 218 210 235 248 244   Basic Metabolic Panel: Recent Labs  Lab 06/10/23 1125 06/11/23 0505 06/12/23 0527 06/13/23 0521 06/14/23 0632  NA 137 134* 137 137 135  K 3.1* 3.4* 4.0 3.6 3.7  CL 100 97* 102 100 100  CO2 24 25 24 27 29   GLUCOSE 103* 141* 126* 116* 107*  BUN 11 19 27* 24* 26*  CREATININE 0.82 0.79 0.79 0.77 0.84  CALCIUM 9.9 9.9 9.7 9.4 9.1  MG  --   --  2.2 2.1 2.0  PHOS  --   --  3.7 3.6 3.5    GFR: Estimated Creatinine Clearance: 121.6 mL/min (by C-G formula based on SCr of 0.84 mg/dL). Liver Function Tests: Recent Labs  Lab 06/10/23 1125 06/12/23 0527 06/13/23 0521 06/14/23 0632  AST 38 18 15 17   ALT 42 30 28 31   ALKPHOS 80 74 67 66  BILITOT 0.5 0.4 0.3 0.5  PROT 8.1 7.5 7.3 6.9  ALBUMIN 4.0 3.7 3.6 3.4*   No results for input(s): "LIPASE", "AMYLASE" in the last 168 hours. No results for input(s): "AMMONIA" in the last 168 hours. Coagulation Profile: No results for input(s): "INR", "PROTIME" in the last 168 hours. Cardiac Enzymes: No results for input(s): "CKTOTAL", "CKMB", "CKMBINDEX", "TROPONINI" in the last 168 hours. BNP (last 3 results) No results for input(s): "PROBNP" in the last 8760 hours. HbA1C: No results for input(s): "HGBA1C" in the last  72 hours. CBG: Recent Labs  Lab 06/13/23 0640  GLUCAP 118*   Lipid Profile: No results for input(s): "CHOL", "HDL", "LDLCALC", "TRIG", "CHOLHDL", "LDLDIRECT" in the last 72 hours. Thyroid Function Tests: No results for input(s): "TSH", "T4TOTAL", "FREET4", "T3FREE", "THYROIDAB" in the last 72 hours. Anemia Panel: No results for input(s): "VITAMINB12", "FOLATE", "FERRITIN", "TIBC", "IRON", "RETICCTPCT" in the last 72 hours. Sepsis Labs: No results for input(s): "PROCALCITON", "LATICACIDVEN" in the last 168 hours.  Recent Results (from the past 240 hours)  Resp panel by RT-PCR (RSV, Flu A&B, Covid) Anterior Nasal Swab     Status: None   Collection Time: 06/10/23  2:26 PM   Specimen: Anterior Nasal Swab  Result Value Ref Range Status   SARS Coronavirus 2 by RT PCR NEGATIVE NEGATIVE Final    Comment: (NOTE) SARS-CoV-2 target nucleic acids are NOT DETECTED.  The SARS-CoV-2 RNA is generally detectable in upper respiratory specimens during the acute phase of infection. The lowest concentration of SARS-CoV-2 viral copies this assay can detect is 138 copies/mL. A negative result does not preclude  SARS-Cov-2 infection and should not be used as the sole basis for treatment or other patient management decisions. A negative result may occur with  improper specimen collection/handling, submission of specimen other than nasopharyngeal swab, presence of viral mutation(s) within the areas targeted by this assay, and inadequate number of viral copies(<138 copies/mL). A negative result must be combined with clinical observations, patient history, and epidemiological information. The expected result is Negative.  Fact Sheet for Patients:  BloggerCourse.com  Fact Sheet for Healthcare Providers:  SeriousBroker.it  This test is no t yet approved or cleared by the [place] FDA and  has been authorized for detection and/or diagnosis of SARS-CoV-2 by FDA under an Emergency Use Authorization (EUA). This EUA will remain  in effect (meaning this test can be used) for the duration of the COVID-19 declaration under Section 564(b)(1) of the Act, 21 U.S.C.section 360bbb-3(b)(1), unless the authorization is terminated  or revoked sooner.       Influenza A by PCR NEGATIVE NEGATIVE Final   Influenza B by PCR NEGATIVE NEGATIVE Final    Comment: (NOTE) The Xpert Xpress SARS-CoV-2/FLU/RSV plus assay is intended as an aid in the diagnosis of influenza from Nasopharyngeal swab specimens and should not be used as a sole basis for treatment. Nasal washings and aspirates are unacceptable for Xpert Xpress SARS-CoV-2/FLU/RSV testing.  Fact Sheet for Patients: BloggerCourse.com  Fact Sheet for Healthcare Providers: SeriousBroker.it  This test is not yet approved or cleared by the [place] FDA and has been authorized for detection and/or diagnosis of SARS-CoV-2 by FDA under an Emergency Use Authorization (EUA). This EUA will remain in effect (meaning this test can be used) for the duration of  the COVID-19 declaration under Section 564(b)(1) of the Act, 21 U.S.C. section 360bbb-3(b)(1), unless the authorization is terminated or revoked.     Resp Syncytial Virus by PCR NEGATIVE NEGATIVE Final    Comment: (NOTE) Fact Sheet for Patients: BloggerCourse.com  Fact Sheet for Healthcare Providers: SeriousBroker.it  This test is not yet approved or cleared by the [place] FDA and has been authorized for detection and/or diagnosis of SARS-CoV-2 by FDA under an Emergency Use Authorization (EUA). This EUA will remain in effect (meaning this test can be used) for the duration of the COVID-19 declaration under Section 564(b)(1) of the Act, 21 U.S.C. section 360bbb-3(b)(1), unless the authorization is terminated or revoked.  Performed at [facility], [address] [place], [place] [address]   Respiratory (~20 pathogens) panel by PCR     Status: None   Collection Time: 06/11/23  3:00 PM  Result Value Ref Range Status   Adenovirus NOT DETECTED NOT DETECTED Final   Coronavirus 229E NOT DETECTED NOT DETECTED Final    Comment: (NOTE) The Coronavirus on the Respiratory Panel, DOES NOT test for the novel  Coronavirus (2019 nCoV)    Coronavirus HKU1 NOT DETECTED NOT DETECTED Final   Coronavirus NL63 NOT DETECTED NOT DETECTED Final   Coronavirus OC43 NOT DETECTED NOT DETECTED Final   Metapneumovirus NOT DETECTED NOT DETECTED Final   Rhinovirus / Enterovirus NOT DETECTED NOT DETECTED Final   Influenza A NOT DETECTED NOT DETECTED Final   Influenza B NOT DETECTED NOT DETECTED Final   Parainfluenza Virus 1 NOT DETECTED NOT DETECTED Final   Parainfluenza Virus 2 NOT DETECTED NOT DETECTED Final   Parainfluenza Virus 3 NOT DETECTED NOT DETECTED Final   Parainfluenza Virus 4 NOT DETECTED NOT DETECTED Final   Respiratory Syncytial Virus NOT DETECTED NOT DETECTED Final   Bordetella pertussis NOT DETECTED NOT DETECTED  Final   Bordetella Parapertussis NOT DETECTED NOT DETECTED Final   Chlamydophila pneumoniae NOT DETECTED NOT DETECTED Final   Mycoplasma pneumoniae NOT DETECTED NOT DETECTED Final    Comment: Performed at [facility], [address] [place], [place] [address]    Radiology Studies: DG CHEST PORT 1 VIEW Result Date: 06/14/2023 CLINICAL DATA:  141880 SOB (shortness of breath) [id number] EXAM: PORTABLE CHEST 1 VIEW COMPARISON:  Chest radiograph from one day prior. FINDINGS: Stable cardiomediastinal silhouette with mild cardiomegaly. No pneumothorax. No significant pleural effusions, noting exclusion of portions of the costophrenic angles from the image. No overt pulmonary edema. No consolidative airspace disease. IMPRESSION: Stable mild cardiomegaly. No overt pulmonary edema. Electronically Signed   By: Delbert Phenix M.D.   On: 06/14/2023 09:55   ECHOCARDIOGRAM COMPLETE Result Date: 06/13/2023    ECHOCARDIOGRAM REPORT   Patient Name:   Zarie Cheuvront Date of Exam: 06/13/2023 Medical Rec #:  [id number]     Height:       67.0 in Accession #:    [id number]    Weight:       350.3 lb Date of Birth:  [birth year]     BSA:          2.566 m Patient Age:    52 years      BP:           166/70 mmHg Patient Gender: F             HR:           73 bpm. Exam Location:  Inpatient Procedure: 2D Echo, Cardiac Doppler and Color Doppler Indications:    Dyspnea  History:        Patient has prior history of Echocardiogram examinations, most                 recent 02/20/2021. COPD, Signs/Symptoms:Chest Pain; Risk                 Factors:Dyslipidemia and Hypertension.  Sonographer:    Vern Claude Referring Phys: [id number] Marili Vader LATIF [facility] IMPRESSIONS  1. Left ventricular ejection fraction, by estimation, is 55%. The left ventricle has normal function. Left ventricular endocardial border not optimally defined to evaluate regional wall motion. There is mild left ventricular hypertrophy. Left ventricular diastolic parameters are  indeterminate.  2. Right ventricular systolic function is  normal. The right ventricular size is normal. Tricuspid regurgitation signal is inadequate for assessing PA pressure.  3. The mitral valve is grossly normal. Trivial mitral valve regurgitation. No evidence of mitral stenosis.  4. The aortic valve is tricuspid. Aortic valve regurgitation is not visualized. No aortic stenosis is present.  5. The inferior vena cava is normal in size with greater than 50% respiratory variability, suggesting right atrial pressure of 3 mmHg. FINDINGS  Left Ventricle: Left ventricular ejection fraction, by estimation, is 55%. The left ventricle has normal function. Left ventricular endocardial border not optimally defined to evaluate regional wall motion. The left ventricular internal cavity size was normal in size. There is mild left ventricular hypertrophy. Left ventricular diastolic parameters are indeterminate. Right Ventricle: The right ventricular size is normal. No increase in right ventricular wall thickness. Right ventricular systolic function is normal. Tricuspid regurgitation signal is inadequate for assessing PA pressure. Left Atrium: Left atrial size was normal in size. Right Atrium: Right atrial size was normal in size. Pericardium: There is no evidence of pericardial effusion. Mitral Valve: The mitral valve is grossly normal. Trivial mitral valve regurgitation. No evidence of mitral valve stenosis. MV peak gradient, 2.2 mmHg. The mean mitral valve gradient is 1.0 mmHg. Tricuspid Valve: The tricuspid valve is grossly normal. Tricuspid valve regurgitation is trivial. No evidence of tricuspid stenosis. Aortic Valve: The aortic valve is tricuspid. Aortic valve regurgitation is not visualized. No aortic stenosis is present. Aortic valve mean gradient measures 5.0 mmHg. Aortic valve peak gradient measures 10.5 mmHg. Aortic valve area, by VTI measures 2.73  cm. Pulmonic Valve: The pulmonic valve was grossly normal. Pulmonic  valve regurgitation is trivial. No evidence of pulmonic stenosis. Aorta: The aortic root is normal in size and structure. Venous: The inferior vena cava is normal in size with greater than 50% respiratory variability, suggesting right atrial pressure of 3 mmHg. IAS/Shunts: The interatrial septum was not well visualized.  LEFT VENTRICLE PLAX 2D LVIDd:         5.00 cm      Diastology LVIDs:         3.40 cm      LV e' medial:    5.66 cm/s LV PW:         1.20 cm      LV E/e' medial:  13.8 LV IVS:        1.00 cm      LV e' lateral:   7.94 cm/s LVOT diam:     2.20 cm      LV E/e' lateral: 9.8 LV SV:         80 LV SV Index:   31 LVOT Area:     3.80 cm  LV Volumes (MOD) LV vol d, MOD A2C: 129.0 ml LV vol d, MOD A4C: 186.0 ml LV vol s, MOD A2C: 51.2 ml LV vol s, MOD A4C: 92.0 ml LV SV MOD A2C:     77.8 ml LV SV MOD A4C:     186.0 ml LV SV MOD BP:      90.3 ml RIGHT VENTRICLE             IVC RV Basal diam:  3.50 cm     IVC diam: 1.00 cm RV Mid diam:    2.50 cm RV S prime:     10.70 cm/s TAPSE (M-mode): 3.0 cm LEFT ATRIUM             Index        RIGHT  ATRIUM           Index LA diam:        3.10 cm 1.21 cm/m   RA Area:     15.50 cm LA Vol (A2C):   56.4 ml 21.98 ml/m  RA Volume:   36.80 ml  14.34 ml/m LA Vol (A4C):   34.1 ml 13.29 ml/m LA Biplane Vol: 44.9 ml 17.50 ml/m  AORTIC VALVE                    PULMONIC VALVE AV Area (Vmax):    2.35 cm     PV Vmax:       0.86 m/s AV Area (Vmean):   2.19 cm     PV Peak grad:  2.9 mmHg AV Area (VTI):     2.73 cm AV Vmax:           162.00 cm/s AV Vmean:          98.300 cm/s AV VTI:            0.294 m AV Peak Grad:      10.5 mmHg AV Mean Grad:      5.0 mmHg LVOT Vmax:         100.35 cm/s LVOT Vmean:        56.600 cm/s LVOT VTI:          0.212 m LVOT/AV VTI ratio: 0.72  AORTA Ao Root diam: 3.10 cm Ao Asc diam:  2.90 cm MITRAL VALVE MV Area (PHT): 3.03 cm    SHUNTS MV Area VTI:   3.38 cm    Systemic VTI:  0.21 m MV Peak grad:  2.2 mmHg    Systemic Diam: 2.20 cm MV Mean grad:  1.0  mmHg MV Vmax:       0.74 m/s MV Vmean:      55.7 cm/s MV Decel Time: 250 msec MV E velocity: 78.20 cm/s MV A velocity: 83.70 cm/s MV E/A ratio:  0.93 Weston Brass MD Electronically signed by Weston Brass MD Signature Date/Time: 06/13/2023/7:46:03 PM    Final    CT CHEST W CONTRAST Result Date: 06/13/2023 CLINICAL DATA:  Pneumonia, complication suspected, xray done EXAM: CT CHEST WITH CONTRAST TECHNIQUE: Multidetector CT imaging of the chest was performed during intravenous contrast administration. RADIATION DOSE REDUCTION: This exam was performed according to the departmental dose-optimization program which includes automated exposure control, adjustment of the mA and/or kV according to patient size and/or use of iterative reconstruction technique. CONTRAST:  75mL OMNIPAQUE IOHEXOL 300 MG/ML  SOLN COMPARISON:  X-ray 06/13/2023, CT 10/12/2022 FINDINGS: Cardiovascular: Heart size is normal. No pericardial effusion. Thoracic aorta is nonaneurysmal. Central pulmonary vasculature is within normal limits. Mediastinum/Nodes: No enlarged mediastinal, hilar, or axillary lymph nodes. Thyroid gland, trachea, and esophagus demonstrate no significant findings. Lungs/Pleura: Mild subsegmental atelectasis within the lingula and medial right lung base. There are a few subtle areas of peripheral ground-glass opacity within the right upper lobe and to a lesser degree within the periphery of the left upper lobe. No pleural effusion. No pneumothorax. Upper Abdomen: Small diverticulum arising from the posterior aspect of the gastric fundus. No acute abnormality. Musculoskeletal: No chest wall abnormality. No acute or significant osseous findings. IMPRESSION: 1. There are a few subtle areas of peripheral ground-glass opacity within the right upper lobe and to a lesser degree within the periphery of the left upper lobe. Findings are nonspecific but may represent a mild infectious or inflammatory process. 2. Mild subsegmental  atelectasis within the lingula and medial right lung base. Electronically Signed   By: Duanne Guess D.O.   On: 06/13/2023 14:23   DG CHEST PORT 1 VIEW Result Date: 06/13/2023 CLINICAL DATA:  52 year old female with shortness of breath. EXAM: PORTABLE CHEST 1 VIEW COMPARISON:  Portable chest yesterday and earlier. FINDINGS: Portable AP semi upright view at 0453 hours. Stable cardiomegaly and mediastinal contours. Stable pulmonary vascular congestion. Visualized tracheal air column is within normal limits. Stable mild thoracic aorta tortuosity, mediastinal contours. No pneumothorax, pleural effusion or confluent opacity. No acute osseous abnormality identified. Paucity of bowel gas in the visible abdomen. IMPRESSION: Stable symmetric pulmonary interstitial opacity which could be interstitial edema, viral or atypical respiratory infection. Electronically Signed   By: Odessa Fleming M.D.   On: 06/13/2023 08:24   Scheduled Meds:  amLODipine  5 mg Oral QHS   arformoterol  15 mcg Nebulization BID   azelastine  2 spray Each Nare BID   benzonatate  200 mg Oral TID   budesonide (PULMICORT) nebulizer solution  0.25 mg Nebulization BID   doxycycline  100 mg Oral Q12H   DULoxetine  60 mg Oral Daily   enoxaparin (LOVENOX) injection  80 mg Subcutaneous Q24H   guaiFENesin  1,200 mg Oral BID   ipratropium  0.5 mg Nebulization TID   labetalol  100 mg Oral BID   levalbuterol  0.63 mg Nebulization TID   methylPREDNISolone (SOLU-MEDROL) injection  40 mg Intravenous Q12H   montelukast  10 mg Oral QHS   nicotine  7 mg Transdermal Daily   pantoprazole  40 mg Oral BID   polyethylene glycol  17 g Oral BID   pravastatin  40 mg Oral Daily   senna-docusate  1 tablet Oral BID   spironolactone  25 mg Oral Daily   traZODone  100 mg Oral QHS   Continuous Infusions:   LOS: 3 days   Marguerita Merles, DO [facility] Available via Epic secure chat 7am-7pm After these hours, please refer to coverage provider listed on  [email or web address] 06/14/2023, 1:00 PM

## 2023-06-14 NOTE — Plan of Care (Signed)

## 2023-06-14 NOTE — Consult Note (Addendum)
   NAME:  Judy Baker, MRN:  [id number], DOB:  04-03-1971, LOS: 3 ADMISSION DATE:  06/10/2023, CONSULTATION DATE:  12/12 REFERRING MD:  Marland Mcalpine, CHIEF COMPLAINT:  shortness of breath and cough    History of Present Illness:  42 year year old female w/ h/o tobacco abuse, OSA, HTN, angina., asthma. Presented to ER 12/10 w/ 5-6d h/o increased nasal congestion, prod cough w/ greay sputum and worsening shortness of breath and chest tightness.   Room air sats 80s on arrival to ER. PCXR w/ increased interstitial markings.   Admitted w/ working dx of AECOPD. Placed on oxygen, nebs, systemic steroids and supportive care.  Given CXR findings raising concern for edema was also administered lasix on 12/12. ECHO ordered and pending.  BNP was 72, RVP was negative, influenza and covid neg, HIV AB neg  Pertinent  Medical History  OSA, obesity,  hypertension, GERD, tobacco abuse, HL, asthma   Significant Hospital Events: Including procedures, antibiotic start and stop dates in addition to other pertinent events   12/11 admitted started on Doxy, systemic steroids and nebs 12/12 got lasix for possible component of edema 12/13 pccm consulted ECHO pending CT pending no clear PE  Interim History / Subjective:  Complaining of increased cough  Objective   Blood pressure (!) 140/76, pulse (!) 57, temperature 98.2 F (36.8 C), temperature source Oral, resp. rate 20, height 5\' 7"  (1.702 m), weight (!) 159.2 kg, SpO2 97%.        Intake/Output Summary (Last 24 hours) at 06/14/2023 1058 Last data filed at 06/14/2023 0825 Gross per 24 hour  Intake 240 ml  Output 1552 ml  Net -1312 ml   Filed Weights   06/10/23 1100 06/13/23 0507 06/14/23 0500  Weight: (!) 145.2 kg (!) 158.9 kg (!) 159.2 kg    Physical Exam: General: Well-appearing, no acute distress HENT: [place], AT Eyes: EOMI, no scleral icterus Respiratory:Upper lung wheezing bilaterally.    Cardiovascular: RRR, -M/R/G, no  JVD Extremities:-Edema,-tenderness Neuro: AAO x4, CNII-XII grossly intact Psych: Normal mood, normal affect   Resolved Hospital Problem list     Assessment & Plan:  Acute hypoxic resp failure Acute bronchitis AECOPD  Copd/asthma overlap  laryngopharyngeal reflux disease Tobacco abuse (stopped 2 weeks ago)  OSA (compliant w/ CPAP)  HTN GERD    Acute hypoxemic respiratory failure in setting of asthma +/- COPD overlap Probable LPRD with upper airway wheezing/hoarseness --Wean supplemental O2 for goal >88% --Continue solumedrol BID --Continue nebulizers: brovana, pulmicort --Continue doxy for 7 days --Agree with gentle diuresis if able to tolerate --Pulmonary toilet including IS, OOB as tolerated, mucinex --Add upper airway management including increaseing PPI, flonase and cough syrup for vocal cord rest --Add tessalon perles  Best Practice (right click and "Reselect all SmartList Selections" daily)  Per primary    Critical care time: NA   Care Time: 35 min  Mechele Collin, M.D. [facility] Pulmonary/Critical Care Medicine 06/14/2023 11:00 AM   See Amion for personal pager For hours between 7 PM to 7 AM, please call Elink for urgent questions

## 2023-06-14 NOTE — Plan of Care (Signed)
?  Problem: Clinical Measurements: ?Goal: Ability to maintain clinical measurements within normal limits will improve ?Outcome: Progressing ?Goal: Will remain free from infection ?Outcome: Progressing ?Goal: Diagnostic test results will improve ?Outcome: Progressing ?  ?

## 2023-06-14 NOTE — Progress Notes (Signed)
   06/14/23 2116  BiPAP/CPAP/SIPAP  BiPAP/CPAP/SIPAP Pt Type Adult  Reason BIPAP/CPAP not in use Non-compliant (equipment remains on standby should she change her mind)

## 2023-06-15 ENCOUNTER — Inpatient Hospital Stay (HOSPITAL_COMMUNITY): Payer: Medicaid Other

## 2023-06-15 DIAGNOSIS — J9601 Acute respiratory failure with hypoxia: Secondary | ICD-10-CM | POA: Diagnosis not present

## 2023-06-15 DIAGNOSIS — D72829 Elevated white blood cell count, unspecified: Secondary | ICD-10-CM | POA: Diagnosis not present

## 2023-06-15 DIAGNOSIS — E876 Hypokalemia: Secondary | ICD-10-CM | POA: Diagnosis not present

## 2023-06-15 DIAGNOSIS — J441 Chronic obstructive pulmonary disease with (acute) exacerbation: Secondary | ICD-10-CM | POA: Diagnosis not present

## 2023-06-15 LAB — PHOSPHORUS: Phosphorus: 3.7 mg/dL (ref 2.5–4.6)

## 2023-06-15 LAB — COMPREHENSIVE METABOLIC PANEL
ALT: 34 U/L (ref 0–44)
AST: 17 U/L (ref 15–41)
Albumin: 3.4 g/dL — ABNORMAL LOW (ref 3.5–5.0)
Alkaline Phosphatase: 72 U/L (ref 38–126)
Anion gap: 8 (ref 5–15)
BUN: 32 mg/dL — ABNORMAL HIGH (ref 6–20)
CO2: 27 mmol/L (ref 22–32)
Calcium: 9.2 mg/dL (ref 8.9–10.3)
Chloride: 99 mmol/L (ref 98–111)
Creatinine, Ser: 0.85 mg/dL (ref 0.44–1.00)
GFR, Estimated: >60 mL/min (ref >60)
Glucose, Bld: 138 mg/dL — ABNORMAL HIGH (ref 70–99)
Potassium: 3.6 mmol/L (ref 3.5–5.1)
Sodium: 134 mmol/L — ABNORMAL LOW (ref 135–145)
Total Bilirubin: 0.3 mg/dL (ref <1.2)
Total Protein: 7 g/dL (ref 6.5–8.1)

## 2023-06-15 LAB — CBC WITH DIFFERENTIAL/PLATELET
Abs Immature Granulocytes: 0.09 10*3/uL — ABNORMAL HIGH (ref 0.00–0.07)
Basophils Absolute: 0 10*3/uL (ref 0.0–0.1)
Basophils Relative: 0 %
Eosinophils Absolute: 0 10*3/uL (ref 0.0–0.5)
Eosinophils Relative: 0 %
HCT: 45.9 % (ref 36.0–46.0)
Hemoglobin: 14.9 g/dL (ref 12.0–15.0)
Immature Granulocytes: 1 %
Lymphocytes Relative: 17 %
Lymphs Abs: 2.3 10*3/uL (ref 0.7–4.0)
MCH: 31.7 pg (ref 26.0–34.0)
MCHC: 32.5 g/dL (ref 30.0–36.0)
MCV: 97.7 fL (ref 80.0–100.0)
Monocytes Absolute: 1.6 10*3/uL — ABNORMAL HIGH (ref 0.1–1.0)
Monocytes Relative: 12 %
Neutro Abs: 9.9 10*3/uL — ABNORMAL HIGH (ref 1.7–7.7)
Neutrophils Relative %: 70 %
Platelets: 295 10*3/uL (ref 150–400)
RBC: 4.7 MIL/uL (ref 3.87–5.11)
RDW: 12.8 % (ref 11.5–15.5)
WBC: 14 10*3/uL — ABNORMAL HIGH (ref 4.0–10.5)
nRBC: 0 % (ref 0.0–0.2)

## 2023-06-15 LAB — MAGNESIUM: Magnesium: 2.1 mg/dL (ref 1.7–2.4)

## 2023-06-15 MED ORDER — BUDESONIDE 0.5 MG/2ML IN SUSP
0.5000 mg | Freq: Two times a day (BID) | RESPIRATORY_TRACT | Status: DC
  Administered 2023-06-15 (×2): 0.5 mg via RESPIRATORY_TRACT
  Filled 2023-06-15 (×3): qty 2

## 2023-06-15 MED ORDER — FUROSEMIDE 10 MG/ML IJ SOLN
40.0000 mg | Freq: Once | INTRAMUSCULAR | Status: AC
  Administered 2023-06-15: 40 mg via INTRAVENOUS
  Filled 2023-06-15: qty 4

## 2023-06-15 NOTE — Plan of Care (Signed)

## 2023-06-15 NOTE — Consult Note (Signed)
   NAME:  Judy Baker, MRN:  [id number], DOB:  10/18/1970, LOS: 4 ADMISSION DATE:  06/10/2023, CONSULTATION DATE:  12/12 REFERRING MD:  Marland Mcalpine, CHIEF COMPLAINT:  shortness of breath and cough    History of Present Illness:  42 year year old female w/ h/o tobacco abuse, OSA, HTN, angina., asthma. Presented to ER 12/10 w/ 5-6d h/o increased nasal congestion, prod cough w/ greay sputum and worsening shortness of breath and chest tightness.   Room air sats 80s on arrival to ER. PCXR w/ increased interstitial markings.   Admitted w/ working dx of AECOPD. Placed on oxygen, nebs, systemic steroids and supportive care.  Given CXR findings raising concern for edema was also administered lasix on 12/12. ECHO ordered and pending.  BNP was 72, RVP was negative, influenza and covid neg, HIV AB neg  Pertinent  Medical History  OSA, obesity,  hypertension, GERD, tobacco abuse, HL, asthma   Significant Hospital Events: Including procedures, antibiotic start and stop dates in addition to other pertinent events   12/11 admitted started on Doxy, systemic steroids and nebs 12/12 got lasix for possible component of edema 12/13 pccm consulted ECHO pending CT pending no clear PE  Interim History / Subjective:  Continues to have increased cough On 2L with SpO2 96%  Objective   Blood pressure (!) 147/75, pulse (!) 58, temperature 97.8 F (36.6 C), resp. rate 20, height 5\' 7"  (1.702 m), weight (!) 159.2 kg, SpO2 96%.        Intake/Output Summary (Last 24 hours) at 06/15/2023 1112 Last data filed at 06/15/2023 0845 Gross per 24 hour  Intake 720 ml  Output --  Net 720 ml   Filed Weights   06/10/23 1100 06/13/23 0507 06/14/23 0500  Weight: (!) 145.2 kg (!) 158.9 kg (!) 159.2 kg   Physical Exam: General: Well-appearing, no acute distress HENT: [place], AT Eyes: EOMI, no scleral icterus Respiratory: Upper airway and lung wheezing bilaterally Cardiovascular: RRR, -M/R/G, no  JVD Extremities:-Edema,-tenderness Neuro: AAO x4, CNII-XII grossly intact Psych: Normal mood, normal affect  WBC 14  Resolved Hospital Problem list     Assessment & Plan:  Acute hypoxic resp failure Acute bronchitis AECOPD  Copd/asthma overlap  laryngopharyngeal reflux disease Tobacco abuse (stopped 2 weeks ago)  OSA (compliant w/ CPAP)  HTN GERD   Acute hypoxemic respiratory failure in setting of asthma +/- COPD overlap Probable LPRD with upper airway wheezing/hoarseness --Wean supplemental O2 for goal >88% --Continue solumedrol BID --Continue nebulizers: brovana, pulmicort. Increased pulmicort to 0.5 on 12/15 --Continue doxy for 7 days --Agree with gentle diuresis if able to tolerate --Pulmonary toilet including IS, OOB as tolerated, mucinex --Add upper airway management including increaseing PPI, flonase and cough syrup for vocal cord rest --Add tessalon perles 12/14  Best Practice (right click and "Reselect all SmartList Selections" daily)  Per primary    Critical care time: NA   Care Time: 36 min  Mechele Collin, M.D. [facility] Pulmonary/Critical Care Medicine 06/15/2023 11:12 AM   See Amion for personal pager For hours between 7 PM to 7 AM, please call Elink for urgent questions

## 2023-06-15 NOTE — Progress Notes (Signed)
PROGRESS NOTE    Judy Baker  HQI:[id number] DOB: July 26, 1970 DOA: 06/10/2023 PCP: Suzan Slick, MD   Brief Narrative:  The patient is a 52 year old African-American female with a past medical history significant for vomiting hypertension, hyperlipidemia, GERD, tobacco abuse, obesity as well as other comorbidities including asthma who was admitted to the hospital with suspected exacerbation of acute COPD with acute respiratory failure with hypoxia.  She presented with a cough and shortness of breath that been getting worse with some congestion.  Cough was productive with gray sputum and she had chest tightness for the last 5 to 6 days.  She does not wear oxygen at baseline and she was transferred to the ED and given nebulized inhaler, Solu-Medrol, Zofran.  She is also placed on 2 L supplemental oxygen nasal cannula and she was noted to desaturate in the mid 80s on room air.  Currently she still feels wheezy and tight in her chest and very Dyspenic on Exertion.   Given CXR findings started gentle diuresis and obtained ECHOCardiogram.  And will also obtain a CT scan of the chest with contrast and obtain pulmonary consultation.  Echocardiogram i finally read and showed normal EF but did show that the left ventricular diastolic parameters are indeterminate.  Pulmonary recommends to continue current regimen.  She was gently diuresed and will give her another dose of IV Lasix today.  Slowly improving  Assessment and Plan:  Acute Exacerbation of COPD -In the setting of ongoing tobacco abuse, increased cough, chest tightness, and wheezing.  No clear inciting factor, respiratory virus panel is negative. -Observation admission changed to Inpatient  -Supplemental oxygen to keep O2 saturation greater than 90% -Change DuoNebs scheduled to Xopenex and Atrovent scheduled and add scheduled budesonide and Brovana; the pulmonary team has increased her budesonide neb to 0.5 mg twice daily -C/w IV Solu-Medrol  60 mg twice daily  ABG    Component Value Date/Time   HCO3 27.8 06/10/2023 1426   TCO2 31 10/11/2022 2143   O2SAT 87.4 06/10/2023 1426  -Treatment as below -Added Doxycycline 100 mg po BID -Respiratory Virus Panel Negative -See below for further treatment  Acute Respiratory Failure with Hypoxia -Likely due to COPD exacerbation, though she does not have respiratory acidosis. ? Component of CHF  -Added budesonide 0.25 mg nebs twice daily as well as arformoterol 15 mcg nebs twice daily -Continue with Xopenex and Atrovent every 6 scheduled and increase Solu-Medrol dose to 60 mg every 12 -Continue with flutter valve, incentive spirometry and guaifenesin 1200 mL p.o. twice daily -Low suspicion for PE, but will obtain D-dimer to rule it out and this was less than 0.27 SpO2: 96 % O2 Flow Rate (L/min): 2 L/min -Checked respiratory virus panel and was negative as well as influenza A/B, RSV and SARS-CoV-2 were negative -Check BNP and was 72.3 but not as reliable in obese  -Check ECHOCardiogram and showed "Left ventricular ejection fraction, by estimation, is 55%. The left ventricle has normal function. Left ventricular endocardial border not optimally defined to evaluate regional wall motion. There is mild left ventricular hypertrophy. Left ventricular diastolic parameters are indeterminate. Right ventricular systolic function is normal " -C/w IV Lasix and will give her IV 40 mg x1 again (received a dose yesterday)  -Continue Supplemental Oxygen via nasal cannula wean O2 as tolerated -Continuous pulse oximetry maintain O2 saturations greater than 90% -Will need an Ambulatory home O2 screen and repeat chest x-ray in the a.m. -Given her lack of improvement and significant dyspnea we  will check a CT scan of the chest with contrast which showed "There are a few subtle areas of peripheral ground-glass opacity within the right upper lobe and to a lesser degree within the periphery of the left upper lobe.  Findings are nonspecific but may represent a mild infectious or inflammatory process. Mild subsegmental atelectasis within the lingula and medial right lung base." -Pulmonary consulted for further evaluation and recommendations and they are recommending continue supplemental oxygen, increasing her PPI and adding a nasal H2 blocker and also adding low-dose Tussionex to assist with cough as well as reflux precaution -Pulmonary now also added Tessalon Perles and agreeing with diuresis and recommending weaning O2 for an O2 saturation for goal greater than 88% -Repeat -CXR this AM done and showed "Stable lung volumes and ventilation since the chest CT on 06/13/2023. No new cardiopulmonary abnormality."   Tobacco Abuse -Smoking Cessation counseling given -Continue with Nicotine Patch 7 mg Transdermally every 24   Hypertension -Currently on Amlodipine 5 mg po qHS and will continue Spirolactone 25 mg po Daily and will hold hydrochlorothiazide 25 mg po Daily  -Continue to monitor blood pressures per protocol -Continue with labetalol 100 mg p.o. twice daily -Last blood pressure reading was 147/75   Hyperlipidemia -Continue Pravastatin 40 mg po Daily    Depression -C/w Duloxetine 60 mg po Daily   Leukocytosis -Mild  and in the setting of Steroid Demargination; Current WBC Trend: Recent Labs  Lab 06/10/23 1125 06/11/23 0505 06/12/23 0527 06/13/23 0521 06/14/23 0632 06/15/23 0542  WBC 4.2 4.5 10.7* 10.6* 11.8* 14.0*  -Continue to Monitor for S/Sx of Infection -Repeat CBC in the AM   Hyponatremia -Na+ Trending from 134-137 the last 5 days -Continue to Monitor and Trend and repeat CMP in the AM  Hypokalemia -Patient's K+ was 3.6 on last check -Continue to Monitor and Replete as Necessary -Repeat CMP in the AM   GERD/GI Prophylaxis -C/w Pantoprazole 40 mg po Daily   Hypoalbuminemia -Patient's Albumin Trending from 3.4-4.0 -Continue to Monitor and Trend and repeat CMP in the  AM  Class III (Superly Morbid) Obesity -Complicates overall prognosis and care -Estimated body mass index is 54.97 kg/m as calculated from the following:   Height as of this encounter: 5\' 7"  (1.702 m).   Weight as of this encounter: 159.2 kg.  -Weight Loss and Dietary Counseling given  DVT prophylaxis: SCDs Start: 06/10/23 1633    Code Status: Full Code Family Communication: No family present at bedside   Disposition Plan:  Level of care: Med-Surg Status is: Inpatient Remains inpatient appropriate because: Needs further clinical improvement and clearance   Consultants:  Pulmonary  Procedures:  ECHOCARDIOGRAM IMPRESSIONS     1. Left ventricular ejection fraction, by estimation, is 55%. The left  ventricle has normal function. Left ventricular endocardial border not  optimally defined to evaluate regional wall motion. There is mild left  ventricular hypertrophy. Left  ventricular diastolic parameters are indeterminate.   2. Right ventricular systolic function is normal. The right ventricular  size is normal. Tricuspid regurgitation signal is inadequate for assessing  PA pressure.   3. The mitral valve is grossly normal. Trivial mitral valve  regurgitation. No evidence of mitral stenosis.   4. The aortic valve is tricuspid. Aortic valve regurgitation is not  visualized. No aortic stenosis is present.   5. The inferior vena cava is normal in size with greater than 50%  respiratory variability, suggesting right atrial pressure of 3 mmHg.   FINDINGS  Left Ventricle: Left ventricular ejection fraction, by estimation, is  55%. The left ventricle has normal function. Left ventricular endocardial  border not optimally defined to evaluate regional wall motion. The left  ventricular internal cavity size was  normal in size. There is mild left ventricular hypertrophy. Left  ventricular diastolic parameters are indeterminate.   Right Ventricle: The right ventricular size is  normal. No increase in  right ventricular wall thickness. Right ventricular systolic function is  normal. Tricuspid regurgitation signal is inadequate for assessing PA  pressure.   Left Atrium: Left atrial size was normal in size.   Right Atrium: Right atrial size was normal in size.   Pericardium: There is no evidence of pericardial effusion.   Mitral Valve: The mitral valve is grossly normal. Trivial mitral valve  regurgitation. No evidence of mitral valve stenosis. MV peak gradient, 2.2  mmHg. The mean mitral valve gradient is 1.0 mmHg.   Tricuspid Valve: The tricuspid valve is grossly normal. Tricuspid valve  regurgitation is trivial. No evidence of tricuspid stenosis.   Aortic Valve: The aortic valve is tricuspid. Aortic valve regurgitation is  not visualized. No aortic stenosis is present. Aortic valve mean gradient  measures 5.0 mmHg. Aortic valve peak gradient measures 10.5 mmHg. Aortic  valve area, by VTI measures 2.73   cm.   Pulmonic Valve: The pulmonic valve was grossly normal. Pulmonic valve  regurgitation is trivial. No evidence of pulmonic stenosis.   Aorta: The aortic root is normal in size and structure.   Venous: The inferior vena cava is normal in size with greater than 50%  respiratory variability, suggesting right atrial pressure of 3 mmHg.   IAS/Shunts: The interatrial septum was not well visualized.     LEFT VENTRICLE  PLAX 2D  LVIDd:         5.00 cm      Diastology  LVIDs:         3.40 cm      LV e' medial:    5.66 cm/s  LV PW:         1.20 cm      LV E/e' medial:  13.8  LV IVS:        1.00 cm      LV e' lateral:   7.94 cm/s  LVOT diam:     2.20 cm      LV E/e' lateral: 9.8  LV SV:         80  LV SV Index:   31  LVOT Area:     3.80 cm    LV Volumes (MOD)  LV vol d, MOD A2C: 129.0 ml  LV vol d, MOD A4C: 186.0 ml  LV vol s, MOD A2C: 51.2 ml  LV vol s, MOD A4C: 92.0 ml  LV SV MOD A2C:     77.8 ml  LV SV MOD A4C:     186.0 ml  LV SV MOD BP:       90.3 ml   RIGHT VENTRICLE             IVC  RV Basal diam:  3.50 cm     IVC diam: 1.00 cm  RV Mid diam:    2.50 cm  RV S prime:     10.70 cm/s  TAPSE (M-mode): 3.0 cm   LEFT ATRIUM             Index        RIGHT ATRIUM  Index  LA diam:        3.10 cm 1.21 cm/m   RA Area:     15.50 cm  LA Vol (A2C):   56.4 ml 21.98 ml/m  RA Volume:   36.80 ml  14.34 ml/m  LA Vol (A4C):   34.1 ml 13.29 ml/m  LA Biplane Vol: 44.9 ml 17.50 ml/m   AORTIC VALVE                    PULMONIC VALVE  AV Area (Vmax):    2.35 cm     PV Vmax:       0.86 m/s  AV Area (Vmean):   2.19 cm     PV Peak grad:  2.9 mmHg  AV Area (VTI):     2.73 cm  AV Vmax:           162.00 cm/s  AV Vmean:          98.300 cm/s  AV VTI:            0.294 m  AV Peak Grad:      10.5 mmHg  AV Mean Grad:      5.0 mmHg  LVOT Vmax:         100.35 cm/s  LVOT Vmean:        56.600 cm/s  LVOT VTI:          0.212 m  LVOT/AV VTI ratio: 0.72    AORTA  Ao Root diam: 3.10 cm  Ao Asc diam:  2.90 cm   MITRAL VALVE  MV Area (PHT): 3.03 cm    SHUNTS  MV Area VTI:   3.38 cm    Systemic VTI:  0.21 m  MV Peak grad:  2.2 mmHg    Systemic Diam: 2.20 cm  MV Mean grad:  1.0 mmHg  MV Vmax:       0.74 m/s  MV Vmean:      55.7 cm/s  MV Decel Time: 250 msec  MV E velocity: 78.20 cm/s  MV A velocity: 83.70 cm/s  MV E/A ratio:  0.93   CT Chest w/ Contrast  Antimicrobials:  Anti-infectives (From admission, onward)    Start     Dose/Rate Route Frequency Ordered Stop   06/11/23 1700  doxycycline (VIBRA-TABS) tablet 100 mg        100 mg Oral Every 12 hours 06/11/23 1555         Subjective: Seen and examined at bedside and thinks her respiratory status is doing little bit better.  She denies any nausea or vomiting and thinks her breathing is a bit better and was able to sit up at the edge of the bed today.  States that she has been ambulating with minimal dyspnea.  No lightheadedness or dizziness.  No other concerns or complaints at  this time.  Objective: Vitals:   06/14/23 2114 06/15/23 0538 06/15/23 0829 06/15/23 1105  BP:  (!) 147/75  (!) 147/75  Pulse:  (!) 58  (!) 58  Resp:  20    Temp:  97.8 F (36.6 C)    TempSrc:      SpO2: 96% 93% 96%   Weight:      Height:        Intake/Output Summary (Last 24 hours) at 06/15/2023 1602 Last data filed at 06/15/2023 0845 Gross per 24 hour  Intake 480 ml  Output --  Net 480 ml   Filed Weights   06/10/23 1100 06/13/23  0507 06/14/23 0500  Weight: (!) 145.2 kg (!) 158.9 kg (!) 159.2 kg   Examination: Physical Exam:  Constitutional: WN/WD morbidly obese African-American female in no acute distress and manage sitting up at the edge of the bed today Respiratory: Diminished to auscultation bilaterally with some slight coarse breath sounds and have some slight wheezing and rhonchi with minimal crackles but no appreciable rales noted. Normal respiratory effort and patient is not tachypenic. No accessory muscle use.  Unlabored breathing but wearing supplemental oxygen via nasal cannula Cardiovascular: RRR, no murmurs / rubs / gallops. S1 and S2 auscultated.  Mild extremity edema Abdomen: Soft, non-tender, distended secondary to body habitus. Bowel sounds positive.  GU: Deferred. Musculoskeletal: No clubbing / cyanosis of digits/nails. No joint deformity upper and lower extremities. Skin: No rashes, lesions, ulcers on a limited skin evaluation. No induration; Warm and dry.  Neurologic: CN 2-12 grossly intact with no focal deficits. Romberg sign and cerebellar reflexes not assessed.  Psychiatric: Normal judgment and insight. Alert and oriented x 3. Normal mood and appropriate affect.   Data Reviewed: I have personally reviewed following labs and imaging studies  CBC: Recent Labs  Lab 06/11/23 0505 06/12/23 0527 06/13/23 0521 06/14/23 0632 06/15/23 0542  WBC 4.5 10.7* 10.6* 11.8* 14.0*  NEUTROABS  --  8.6* 8.5* 7.9* 9.9*  HGB 15.6* 14.7 15.1* 15.0 14.9  HCT 46.9*  45.4 45.1 45.7 45.9  MCV 95.1 98.3 95.3 96.4 97.7  PLT 210 235 248 244 295   Basic Metabolic Panel: Recent Labs  Lab 06/11/23 0505 06/12/23 0527 06/13/23 0521 06/14/23 0632 06/15/23 0542  NA 134* 137 137 135 134*  K 3.4* 4.0 3.6 3.7 3.6  CL 97* 102 100 100 99  CO2 25 24 27 29 27   GLUCOSE 141* 126* 116* 107* 138*  BUN 19 27* 24* 26* 32*  CREATININE 0.79 0.79 0.77 0.84 0.85  CALCIUM 9.9 9.7 9.4 9.1 9.2  MG  --  2.2 2.1 2.0 2.1  PHOS  --  3.7 3.6 3.5 3.7   GFR: Estimated Creatinine Clearance: 120.2 mL/min (by C-G formula based on SCr of 0.85 mg/dL). Liver Function Tests: Recent Labs  Lab 06/10/23 1125 06/12/23 0527 06/13/23 0521 06/14/23 0632 06/15/23 0542  AST 38 18 15 17 17   ALT 42 30 28 31  34  ALKPHOS 80 74 67 66 72  BILITOT 0.5 0.4 0.3 0.5 0.3  PROT 8.1 7.5 7.3 6.9 7.0  ALBUMIN 4.0 3.7 3.6 3.4* 3.4*   No results for input(s): "LIPASE", "AMYLASE" in the last 168 hours. No results for input(s): "AMMONIA" in the last 168 hours. Coagulation Profile: No results for input(s): "INR", "PROTIME" in the last 168 hours. Cardiac Enzymes: No results for input(s): "CKTOTAL", "CKMB", "CKMBINDEX", "TROPONINI" in the last 168 hours. BNP (last 3 results) No results for input(s): "PROBNP" in the last 8760 hours. HbA1C: No results for input(s): "HGBA1C" in the last 72 hours. CBG: Recent Labs  Lab 06/13/23 0640  GLUCAP 118*   Lipid Profile: No results for input(s): "CHOL", "HDL", "LDLCALC", "TRIG", "CHOLHDL", "LDLDIRECT" in the last 72 hours. Thyroid Function Tests: No results for input(s): "TSH", "T4TOTAL", "FREET4", "T3FREE", "THYROIDAB" in the last 72 hours. Anemia Panel: No results for input(s): "VITAMINB12", "FOLATE", "FERRITIN", "TIBC", "IRON", "RETICCTPCT" in the last 72 hours. Sepsis Labs: No results for input(s): "PROCALCITON", "LATICACIDVEN" in the last 168 hours.  Recent Results (from the past 240 hours)  Resp panel by RT-PCR (RSV, Flu A&B, Covid) Anterior  Nasal Swab  Status: None   Collection Time: 06/10/23  2:26 PM   Specimen: Anterior Nasal Swab  Result Value Ref Range Status   SARS Coronavirus 2 by RT PCR NEGATIVE NEGATIVE Final    Comment: (NOTE) SARS-CoV-2 target nucleic acids are NOT DETECTED.  The SARS-CoV-2 RNA is generally detectable in upper respiratory specimens during the acute phase of infection. The lowest concentration of SARS-CoV-2 viral copies this assay can detect is 138 copies/mL. A negative result does not preclude SARS-Cov-2 infection and should not be used as the sole basis for treatment or other patient management decisions. A negative result may occur with  improper specimen collection/handling, submission of specimen other than nasopharyngeal swab, presence of viral mutation(s) within the areas targeted by this assay, and inadequate number of viral copies(<138 copies/mL). A negative result must be combined with clinical observations, patient history, and epidemiological information. The expected result is Negative.  Fact Sheet for Patients:  BloggerCourse.com  Fact Sheet for Healthcare Providers:  SeriousBroker.it  This test is no t yet approved or cleared by the [place] FDA and  has been authorized for detection and/or diagnosis of SARS-CoV-2 by FDA under an Emergency Use Authorization (EUA). This EUA will remain  in effect (meaning this test can be used) for the duration of the COVID-19 declaration under Section 564(b)(1) of the Act, 21 U.S.C.section 360bbb-3(b)(1), unless the authorization is terminated  or revoked sooner.       Influenza A by PCR NEGATIVE NEGATIVE Final   Influenza B by PCR NEGATIVE NEGATIVE Final    Comment: (NOTE) The Xpert Xpress SARS-CoV-2/FLU/RSV plus assay is intended as an aid in the diagnosis of influenza from Nasopharyngeal swab specimens and should not be used as a sole basis for treatment. Nasal washings  and aspirates are unacceptable for Xpert Xpress SARS-CoV-2/FLU/RSV testing.  Fact Sheet for Patients: BloggerCourse.com  Fact Sheet for Healthcare Providers: SeriousBroker.it  This test is not yet approved or cleared by the [place] FDA and has been authorized for detection and/or diagnosis of SARS-CoV-2 by FDA under an Emergency Use Authorization (EUA). This EUA will remain in effect (meaning this test can be used) for the duration of the COVID-19 declaration under Section 564(b)(1) of the Act, 21 U.S.C. section 360bbb-3(b)(1), unless the authorization is terminated or revoked.     Resp Syncytial Virus by PCR NEGATIVE NEGATIVE Final    Comment: (NOTE) Fact Sheet for Patients: BloggerCourse.com  Fact Sheet for Healthcare Providers: SeriousBroker.it  This test is not yet approved or cleared by the [place] FDA and has been authorized for detection and/or diagnosis of SARS-CoV-2 by FDA under an Emergency Use Authorization (EUA). This EUA will remain in effect (meaning this test can be used) for the duration of the COVID-19 declaration under Section 564(b)(1) of the Act, 21 U.S.C. section 360bbb-3(b)(1), unless the authorization is terminated or revoked.  Performed at [facility], [address], [place], [place] [address]   Respiratory (~20 pathogens) panel by PCR     Status: None   Collection Time: 06/11/23  3:00 PM  Result Value Ref Range Status   Adenovirus NOT DETECTED NOT DETECTED Final   Coronavirus 229E NOT DETECTED NOT DETECTED Final    Comment: (NOTE) The Coronavirus on the Respiratory Panel, DOES NOT test for the novel  Coronavirus (2019 nCoV)    Coronavirus HKU1 NOT DETECTED NOT DETECTED Final   Coronavirus NL63 NOT DETECTED NOT DETECTED Final   Coronavirus OC43 NOT DETECTED NOT DETECTED Final   Metapneumovirus  NOT DETECTED NOT  DETECTED Final   Rhinovirus / Enterovirus NOT DETECTED NOT DETECTED Final   Influenza A NOT DETECTED NOT DETECTED Final   Influenza B NOT DETECTED NOT DETECTED Final   Parainfluenza Virus 1 NOT DETECTED NOT DETECTED Final   Parainfluenza Virus 2 NOT DETECTED NOT DETECTED Final   Parainfluenza Virus 3 NOT DETECTED NOT DETECTED Final   Parainfluenza Virus 4 NOT DETECTED NOT DETECTED Final   Respiratory Syncytial Virus NOT DETECTED NOT DETECTED Final   Bordetella pertussis NOT DETECTED NOT DETECTED Final   Bordetella Parapertussis NOT DETECTED NOT DETECTED Final   Chlamydophila pneumoniae NOT DETECTED NOT DETECTED Final   Mycoplasma pneumoniae NOT DETECTED NOT DETECTED Final    Comment: Performed at [facility], [address], [place], [place] [address]    Radiology Studies: DG CHEST PORT 1 VIEW Result Date: 06/15/2023 CLINICAL DATA:  52 year old female with shortness of breath. EXAM: PORTABLE CHEST 1 VIEW COMPARISON:  Portable chest yesterday and earlier. FINDINGS: Portable AP semi upright view at 0549 hours. Stable borderline to mild cardiomegaly. Other mediastinal contours are within normal limits. Stable lung volumes and ventilation since the chest CT on 06/13/2023. No pneumothorax or confluent lung opacity. Visualized tracheal air column is within normal limits. No acute osseous abnormality identified. IMPRESSION: Stable lung volumes and ventilation since the chest CT on 06/13/2023. No new cardiopulmonary abnormality. Electronically Signed   By: Odessa Fleming M.D.   On: 06/15/2023 08:22   DG CHEST PORT 1 VIEW Result Date: 06/14/2023 CLINICAL DATA:  [id number] SOB (shortness of breath) [id number] EXAM: PORTABLE CHEST 1 VIEW COMPARISON:  Chest radiograph from one day prior. FINDINGS: Stable cardiomediastinal silhouette with mild cardiomegaly. No pneumothorax. No significant pleural effusions, noting exclusion of portions of the costophrenic angles from the image. No overt pulmonary edema. No  consolidative airspace disease. IMPRESSION: Stable mild cardiomegaly. No overt pulmonary edema. Electronically Signed   By: Delbert Phenix M.D.   On: 06/14/2023 09:55   Scheduled Meds:  amLODipine  5 mg Oral QHS   arformoterol  15 mcg Nebulization BID   azelastine  2 spray Each Nare BID   benzonatate  200 mg Oral TID   budesonide (PULMICORT) nebulizer solution  0.5 mg Nebulization BID   doxycycline  100 mg Oral Q12H   DULoxetine  60 mg Oral Daily   enoxaparin (LOVENOX) injection  80 mg Subcutaneous Q24H   furosemide  40 mg Intravenous Once   guaiFENesin  1,200 mg Oral BID   ipratropium  0.5 mg Nebulization TID   labetalol  100 mg Oral BID   levalbuterol  0.63 mg Nebulization TID   methylPREDNISolone (SOLU-MEDROL) injection  40 mg Intravenous Q12H   montelukast  10 mg Oral QHS   nicotine  7 mg Transdermal Daily   pantoprazole  40 mg Oral BID   polyethylene glycol  17 g Oral BID   pravastatin  40 mg Oral Daily   senna-docusate  1 tablet Oral BID   spironolactone  25 mg Oral Daily   traZODone  100 mg Oral QHS   Continuous Infusions:   LOS: 4 days   Marguerita Merles, DO [facility] Available via Epic secure chat 7am-7pm After these hours, please refer to coverage provider listed on [email or web address] 06/15/2023, 4:02 PM

## 2023-06-16 DIAGNOSIS — J441 Chronic obstructive pulmonary disease with (acute) exacerbation: Secondary | ICD-10-CM | POA: Diagnosis not present

## 2023-06-16 DIAGNOSIS — J9601 Acute respiratory failure with hypoxia: Secondary | ICD-10-CM | POA: Diagnosis not present

## 2023-06-16 LAB — COMPREHENSIVE METABOLIC PANEL
ALT: 35 U/L (ref 0–44)
AST: 17 U/L (ref 15–41)
Albumin: 3.3 g/dL — ABNORMAL LOW (ref 3.5–5.0)
Alkaline Phosphatase: 68 U/L (ref 38–126)
Anion gap: 7 (ref 5–15)
BUN: 29 mg/dL — ABNORMAL HIGH (ref 6–20)
CO2: 28 mmol/L (ref 22–32)
Calcium: 9.4 mg/dL (ref 8.9–10.3)
Chloride: 100 mmol/L (ref 98–111)
Creatinine, Ser: 0.95 mg/dL (ref 0.44–1.00)
GFR, Estimated: >60 mL/min (ref >60)
Glucose, Bld: 117 mg/dL — ABNORMAL HIGH (ref 70–99)
Potassium: 3.4 mmol/L — ABNORMAL LOW (ref 3.5–5.1)
Sodium: 135 mmol/L (ref 135–145)
Total Bilirubin: 0.4 mg/dL (ref <1.2)
Total Protein: 6.8 g/dL (ref 6.5–8.1)

## 2023-06-16 LAB — CBC WITH DIFFERENTIAL/PLATELET
Abs Immature Granulocytes: 0.16 10*3/uL — ABNORMAL HIGH (ref 0.00–0.07)
Basophils Absolute: 0 10*3/uL (ref 0.0–0.1)
Basophils Relative: 0 %
Eosinophils Absolute: 0 10*3/uL (ref 0.0–0.5)
Eosinophils Relative: 0 %
HCT: 46.4 % — ABNORMAL HIGH (ref 36.0–46.0)
Hemoglobin: 15.2 g/dL — ABNORMAL HIGH (ref 12.0–15.0)
Immature Granulocytes: 1 %
Lymphocytes Relative: 15 %
Lymphs Abs: 2.1 10*3/uL (ref 0.7–4.0)
MCH: 31.5 pg (ref 26.0–34.0)
MCHC: 32.8 g/dL (ref 30.0–36.0)
MCV: 96.3 fL (ref 80.0–100.0)
Monocytes Absolute: 1.6 10*3/uL — ABNORMAL HIGH (ref 0.1–1.0)
Monocytes Relative: 11 %
Neutro Abs: 10.3 10*3/uL — ABNORMAL HIGH (ref 1.7–7.7)
Neutrophils Relative %: 73 %
Platelets: 322 10*3/uL (ref 150–400)
RBC: 4.82 MIL/uL (ref 3.87–5.11)
RDW: 12.8 % (ref 11.5–15.5)
WBC: 14.1 10*3/uL — ABNORMAL HIGH (ref 4.0–10.5)
nRBC: 0 % (ref 0.0–0.2)

## 2023-06-16 LAB — PHOSPHORUS: Phosphorus: 3.9 mg/dL (ref 2.5–4.6)

## 2023-06-16 LAB — MAGNESIUM: Magnesium: 2.1 mg/dL (ref 1.7–2.4)

## 2023-06-16 MED ORDER — FLUCONAZOLE 200 MG PO TABS
200.0000 mg | ORAL_TABLET | Freq: Once | ORAL | Status: AC
  Administered 2023-06-16: 200 mg via ORAL
  Filled 2023-06-16: qty 1

## 2023-06-16 MED ORDER — HYDROCOD POLI-CHLORPHE POLI ER 10-8 MG/5ML PO SUER
5.0000 mL | Freq: Two times a day (BID) | ORAL | Status: DC | PRN
  Administered 2023-06-16 – 2023-06-17 (×2): 5 mL via ORAL
  Filled 2023-06-16 (×2): qty 5

## 2023-06-16 MED ORDER — ENOXAPARIN SODIUM 80 MG/0.8ML IJ SOSY
75.0000 mg | PREFILLED_SYRINGE | INTRAMUSCULAR | Status: DC
  Administered 2023-06-16: 75 mg via SUBCUTANEOUS
  Filled 2023-06-16: qty 0.8

## 2023-06-16 MED ORDER — LEVALBUTEROL HCL 0.63 MG/3ML IN NEBU
0.6300 mg | INHALATION_SOLUTION | Freq: Two times a day (BID) | RESPIRATORY_TRACT | Status: DC
Start: 2023-06-16 — End: 2023-06-18
  Administered 2023-06-16 – 2023-06-17 (×2): 0.63 mg via RESPIRATORY_TRACT
  Filled 2023-06-16 (×3): qty 3

## 2023-06-16 MED ORDER — METHYLPREDNISOLONE SODIUM SUCC 40 MG IJ SOLR
40.0000 mg | INTRAMUSCULAR | Status: DC
  Administered 2023-06-17: 40 mg via INTRAVENOUS
  Filled 2023-06-16: qty 1

## 2023-06-16 MED ORDER — IPRATROPIUM BROMIDE 0.02 % IN SOLN
0.5000 mg | Freq: Two times a day (BID) | RESPIRATORY_TRACT | Status: DC
  Administered 2023-06-16 – 2023-06-17 (×2): 0.5 mg via RESPIRATORY_TRACT
  Filled 2023-06-16 (×4): qty 2.5

## 2023-06-16 MED ORDER — FAMOTIDINE 20 MG PO TABS
20.0000 mg | ORAL_TABLET | Freq: Every day | ORAL | Status: DC
  Administered 2023-06-16 – 2023-06-17 (×2): 20 mg via ORAL
  Filled 2023-06-16 (×2): qty 1

## 2023-06-16 MED ORDER — BUDESONIDE 0.25 MG/2ML IN SUSP
0.2500 mg | Freq: Two times a day (BID) | RESPIRATORY_TRACT | Status: DC
  Administered 2023-06-16 – 2023-06-17 (×2): 0.25 mg via RESPIRATORY_TRACT
  Filled 2023-06-16 (×4): qty 2

## 2023-06-16 MED ORDER — FLUCONAZOLE 100 MG PO TABS
100.0000 mg | ORAL_TABLET | Freq: Every day | ORAL | Status: DC
  Administered 2023-06-17 – 2023-06-18 (×2): 100 mg via ORAL
  Filled 2023-06-16 (×2): qty 1

## 2023-06-16 NOTE — Progress Notes (Signed)
PROGRESS NOTE    Judy Baker  ZOX:[id number] DOB: Feb 28, 1971 DOA: 06/10/2023 PCP: Suzan Slick, MD    Brief Narrative:  52 year old morbidly obese with history of HTN, HLD, GERD, tobacco use, obesity, asthma admitted for acute hypoxic respiratory failure secondary to COPD   Assessment & Plan:  Principal Problem:   COPD with acute exacerbation (HCC)     Acute Exacerbation of COPD Oral Thrush -On bronchodilators, steroids, I-S/flutter valve.  Pepcid at bedtime.  Finished 5 days of doxycycline. On Diflucan -Respiratory viral panel negative   Acute Respiratory Failure with Hypoxia Secondary to COPD exacerbation with possible component of CHF therefore received as needed Lasix during this hospitalization  Tobacco Abuse Nicotine patch   Hypertension Norvasc, labetalol, Aldactone.  IV as needed   Hyperlipidemia -Continue Pravastatin 40 mg po Daily    Depression -C/w Duloxetine 60 mg po Daily    Leukocytosis Secondary to steroids   Hyponatremia Stable   Hypokalemia As needed repletion   GERD/GI Prophylaxis Protonix and Pepcid   Class III (Superly Morbid) Obesity -Complicates overall prognosis and care -Estimated body mass index is 54.97 kg/m as calculated from the following:   Height as of this encounter: 5\' 7"  (1.702 m).   Weight as of this encounter: 159.2 kg.  -Weight Loss and Dietary Counseling given    DVT prophylaxis: SCDs Start: 06/10/23 1633 Code Status: Full Family Communication:   Status is: Inpatient Remains inpatient appropriate because: Cont hosp stay    Subjective: Doing ok, symptoms are slowly improving. Does have somewhat worsening cough and reporting of some sore throat   Examination:  General exam: Appears calm and comfortable  Respiratory system: Clear to auscultation. Respiratory effort normal. Cardiovascular system: S1 & S2 heard, RRR. No JVD, murmurs, rubs, gallops or clicks. No pedal edema. Gastrointestinal system:  Abdomen is nondistended, soft and nontender. No organomegaly or masses felt. Normal bowel sounds heard. Central nervous system: Alert and oriented. No focal neurological deficits. Extremities: Symmetric 5 x 5 power. Skin: No rashes, lesions or ulcers Psychiatry: Judgement and insight appear normal. Mood & affect appropriate.                Diet Orders (From admission, onward)     Start     Ordered   06/10/23 1633  Diet regular Room service appropriate? Yes; Fluid consistency: Thin  Diet effective now       Question Answer Comment  Room service appropriate? Yes   Fluid consistency: Thin      06/10/23 1633            Objective: Vitals:   06/15/23 2050 06/15/23 2146 06/16/23 0500 06/16/23 1340  BP: (!) 156/81  (!) 140/70   Pulse: 69  61   Resp: 18  18   Temp: 98.7 F (37.1 C)  97.9 F (36.6 C)   TempSrc: Oral     SpO2: 98% 97% 95% 95%  Weight:   (!) 154.4 kg   Height:        Intake/Output Summary (Last 24 hours) at 06/16/2023 1410 Last data filed at 06/15/2023 2302 Gross per 24 hour  Intake 240 ml  Output 2300 ml  Net -2060 ml   Filed Weights   06/13/23 0507 06/14/23 0500 06/16/23 0500  Weight: (!) 158.9 kg (!) 159.2 kg (!) 154.4 kg    Scheduled Meds:  amLODipine  5 mg Oral QHS   arformoterol  15 mcg Nebulization BID   azelastine  2 spray Each Nare BID  benzonatate  200 mg Oral TID   budesonide (PULMICORT) nebulizer solution  0.25 mg Nebulization BID   DULoxetine  60 mg Oral Daily   enoxaparin (LOVENOX) injection  75 mg Subcutaneous Q24H   famotidine  20 mg Oral QHS   [START ON 06/17/2023] fluconazole  100 mg Oral Daily   guaiFENesin  1,200 mg Oral BID   ipratropium  0.5 mg Nebulization BID   labetalol  100 mg Oral BID   levalbuterol  0.63 mg Nebulization BID   [START ON 06/17/2023] methylPREDNISolone (SOLU-MEDROL) injection  40 mg Intravenous Q24H   montelukast  10 mg Oral QHS   nicotine  7 mg Transdermal Daily   pantoprazole  40 mg Oral  BID   polyethylene glycol  17 g Oral BID   pravastatin  40 mg Oral Daily   senna-docusate  1 tablet Oral BID   spironolactone  25 mg Oral Daily   traZODone  100 mg Oral QHS   Continuous Infusions:  Nutritional status     Body mass index is 53.31 kg/m.  Data Reviewed:   CBC: Recent Labs  Lab 06/12/23 0527 06/13/23 0521 06/14/23 0632 06/15/23 0542 06/16/23 0516  WBC 10.7* 10.6* 11.8* 14.0* 14.1*  NEUTROABS 8.6* 8.5* 7.9* 9.9* 10.3*  HGB 14.7 15.1* 15.0 14.9 15.2*  HCT 45.4 45.1 45.7 45.9 46.4*  MCV 98.3 95.3 96.4 97.7 96.3  PLT 235 248 244 295 322   Basic Metabolic Panel: Recent Labs  Lab 06/12/23 0527 06/13/23 0521 06/14/23 0632 06/15/23 0542 06/16/23 0516  NA 137 137 135 134* 135  K 4.0 3.6 3.7 3.6 3.4*  CL 102 100 100 99 100  CO2 24 27 29 27 28   GLUCOSE 126* 116* 107* 138* 117*  BUN 27* 24* 26* 32* 29*  CREATININE 0.79 0.77 0.84 0.85 0.95  CALCIUM 9.7 9.4 9.1 9.2 9.4  MG 2.2 2.1 2.0 2.1 2.1  PHOS 3.7 3.6 3.5 3.7 3.9   GFR: Estimated Creatinine Clearance: 105.5 mL/min (by C-G formula based on SCr of 0.95 mg/dL). Liver Function Tests: Recent Labs  Lab 06/12/23 0527 06/13/23 0521 06/14/23 0632 06/15/23 0542 06/16/23 0516  AST 18 15 17 17 17   ALT 30 28 31  34 35  ALKPHOS 74 67 66 72 68  BILITOT 0.4 0.3 0.5 0.3 0.4  PROT 7.5 7.3 6.9 7.0 6.8  ALBUMIN 3.7 3.6 3.4* 3.4* 3.3*   No results for input(s): "LIPASE", "AMYLASE" in the last 168 hours. No results for input(s): "AMMONIA" in the last 168 hours. Coagulation Profile: No results for input(s): "INR", "PROTIME" in the last 168 hours. Cardiac Enzymes: No results for input(s): "CKTOTAL", "CKMB", "CKMBINDEX", "TROPONINI" in the last 168 hours. BNP (last 3 results) No results for input(s): "PROBNP" in the last 8760 hours. HbA1C: No results for input(s): "HGBA1C" in the last 72 hours. CBG: Recent Labs  Lab 06/13/23 0640  GLUCAP 118*   Lipid Profile: No results for input(s): "CHOL", "HDL",  "LDLCALC", "TRIG", "CHOLHDL", "LDLDIRECT" in the last 72 hours. Thyroid Function Tests: No results for input(s): "TSH", "T4TOTAL", "FREET4", "T3FREE", "THYROIDAB" in the last 72 hours. Anemia Panel: No results for input(s): "VITAMINB12", "FOLATE", "FERRITIN", "TIBC", "IRON", "RETICCTPCT" in the last 72 hours. Sepsis Labs: No results for input(s): "PROCALCITON", "LATICACIDVEN" in the last 168 hours.  Recent Results (from the past 240 hours)  Resp panel by RT-PCR (RSV, Flu A&B, Covid) Anterior Nasal Swab     Status: None   Collection Time: 06/10/23  2:26 PM   Specimen: Anterior  Nasal Swab  Result Value Ref Range Status   SARS Coronavirus 2 by RT PCR NEGATIVE NEGATIVE Final    Comment: (NOTE) SARS-CoV-2 target nucleic acids are NOT DETECTED.  The SARS-CoV-2 RNA is generally detectable in upper respiratory specimens during the acute phase of infection. The lowest concentration of SARS-CoV-2 viral copies this assay can detect is 138 copies/mL. A negative result does not preclude SARS-Cov-2 infection and should not be used as the sole basis for treatment or other patient management decisions. A negative result may occur with  improper specimen collection/handling, submission of specimen other than nasopharyngeal swab, presence of viral mutation(s) within the areas targeted by this assay, and inadequate number of viral copies(<138 copies/mL). A negative result must be combined with clinical observations, patient history, and epidemiological information. The expected result is Negative.  Fact Sheet for Patients:  BloggerCourse.com  Fact Sheet for Healthcare Providers:  SeriousBroker.it  This test is no t yet approved or cleared by the [place] FDA and  has been authorized for detection and/or diagnosis of SARS-CoV-2 by FDA under an Emergency Use Authorization (EUA). This EUA will remain  in effect (meaning this test can be used) for  the duration of the COVID-19 declaration under Section 564(b)(1) of the Act, 21 U.S.C.section 360bbb-3(b)(1), unless the authorization is terminated  or revoked sooner.       Influenza A by PCR NEGATIVE NEGATIVE Final   Influenza B by PCR NEGATIVE NEGATIVE Final    Comment: (NOTE) The Xpert Xpress SARS-CoV-2/FLU/RSV plus assay is intended as an aid in the diagnosis of influenza from Nasopharyngeal swab specimens and should not be used as a sole basis for treatment. Nasal washings and aspirates are unacceptable for Xpert Xpress SARS-CoV-2/FLU/RSV testing.  Fact Sheet for Patients: BloggerCourse.com  Fact Sheet for Healthcare Providers: SeriousBroker.it  This test is not yet approved or cleared by the [place] FDA and has been authorized for detection and/or diagnosis of SARS-CoV-2 by FDA under an Emergency Use Authorization (EUA). This EUA will remain in effect (meaning this test can be used) for the duration of the COVID-19 declaration under Section 564(b)(1) of the Act, 21 U.S.C. section 360bbb-3(b)(1), unless the authorization is terminated or revoked.     Resp Syncytial Virus by PCR NEGATIVE NEGATIVE Final    Comment: (NOTE) Fact Sheet for Patients: BloggerCourse.com  Fact Sheet for Healthcare Providers: SeriousBroker.it  This test is not yet approved or cleared by the [place] FDA and has been authorized for detection and/or diagnosis of SARS-CoV-2 by FDA under an Emergency Use Authorization (EUA). This EUA will remain in effect (meaning this test can be used) for the duration of the COVID-19 declaration under Section 564(b)(1) of the Act, 21 U.S.C. section 360bbb-3(b)(1), unless the authorization is terminated or revoked.  Performed at [facility], [address] [place], [place] [address]   Respiratory (~20 pathogens) panel by PCR      Status: None   Collection Time: 06/11/23  3:00 PM  Result Value Ref Range Status   Adenovirus NOT DETECTED NOT DETECTED Final   Coronavirus 229E NOT DETECTED NOT DETECTED Final    Comment: (NOTE) The Coronavirus on the Respiratory Panel, DOES NOT test for the novel  Coronavirus (2019 nCoV)    Coronavirus HKU1 NOT DETECTED NOT DETECTED Final   Coronavirus NL63 NOT DETECTED NOT DETECTED Final   Coronavirus OC43 NOT DETECTED NOT DETECTED Final   Metapneumovirus NOT DETECTED NOT DETECTED Final   Rhinovirus / Enterovirus NOT DETECTED NOT  DETECTED Final   Influenza A NOT DETECTED NOT DETECTED Final   Influenza B NOT DETECTED NOT DETECTED Final   Parainfluenza Virus 1 NOT DETECTED NOT DETECTED Final   Parainfluenza Virus 2 NOT DETECTED NOT DETECTED Final   Parainfluenza Virus 3 NOT DETECTED NOT DETECTED Final   Parainfluenza Virus 4 NOT DETECTED NOT DETECTED Final   Respiratory Syncytial Virus NOT DETECTED NOT DETECTED Final   Bordetella pertussis NOT DETECTED NOT DETECTED Final   Bordetella Parapertussis NOT DETECTED NOT DETECTED Final   Chlamydophila pneumoniae NOT DETECTED NOT DETECTED Final   Mycoplasma pneumoniae NOT DETECTED NOT DETECTED Final    Comment: Performed at [facility], [address], [place], [place] [address]         Radiology Studies: DG CHEST PORT 1 VIEW Result Date: 06/15/2023 CLINICAL DATA:  52 year old female with shortness of breath. EXAM: PORTABLE CHEST 1 VIEW COMPARISON:  Portable chest yesterday and earlier. FINDINGS: Portable AP semi upright view at 0549 hours. Stable borderline to mild cardiomegaly. Other mediastinal contours are within normal limits. Stable lung volumes and ventilation since the chest CT on 06/13/2023. No pneumothorax or confluent lung opacity. Visualized tracheal air column is within normal limits. No acute osseous abnormality identified. IMPRESSION: Stable lung volumes and ventilation since the chest CT on 06/13/2023. No new  cardiopulmonary abnormality. Electronically Signed   By: Odessa Fleming M.D.   On: 06/15/2023 08:22           LOS: 5 days   Time spent= 35 mins    Miguel Rota, MD [facility]  If 7PM-7AM, please contact night-coverage  06/16/2023, 2:10 PM

## 2023-06-16 NOTE — Plan of Care (Signed)

## 2023-06-16 NOTE — Progress Notes (Signed)
   06/15/23 2155  BiPAP/CPAP/SIPAP  BiPAP/CPAP/SIPAP Pt Type Adult  BiPAP/CPAP/SIPAP DREAMSTATIOND (Remains in room, wants left in Rm.?)  Reason BIPAP/CPAP not in use Non-compliant   Pt. Declined placement this shift for h/s, but wants in room.

## 2023-06-16 NOTE — Progress Notes (Signed)
   NAME:  Judy Baker, MRN:  [id number], DOB:  [birth year], LOS: 5 ADMISSION DATE:  06/10/2023, CONSULTATION DATE:  12/12 REFERRING MD:  Marland Mcalpine, CHIEF COMPLAINT:  shortness of breath and cough    History of Present Illness:  52 year year old female w/ h/o tobacco abuse, OSA, HTN, angina., asthma. Presented to ER 12/10 w/ 5-6d h/o increased nasal congestion, prod cough w/ greay sputum and worsening shortness of breath and chest tightness.   Room air sats 80s on arrival to ER. PCXR w/ increased interstitial markings.   Admitted w/ working dx of AECOPD. Placed on oxygen, nebs, systemic steroids and supportive care.  Given CXR findings raising concern for edema was also administered lasix on 12/12. ECHO ordered and pending.  BNP was 72, RVP was negative, influenza and covid neg, HIV AB neg  Pertinent  Medical History  OSA, obesity,  hypertension, GERD, tobacco abuse, HL, asthma   Significant Hospital Events: Including procedures, antibiotic start and stop dates in addition to other pertinent events   12/11 admitted started on Doxy, systemic steroids and nebs 12/12 got lasix for possible component of edema 12/13 pccm consulted ECHO pending CT pending no clear PE 12/16 still w/ marked vocal hoarseness and now w/ sore throat starting rx for oropharyngeal thrush    Interim History / Subjective:  Still w/ marked wet cough Now has sore throat   Objective   Blood pressure (!) 140/70, pulse 61, temperature 97.9 F (36.6 C), resp. rate 18, height 5\' 7"  (1.702 m), weight (!) 154.4 kg, SpO2 95%.        Intake/Output Summary (Last 24 hours) at 06/16/2023 0716 Last data filed at 06/15/2023 2302 Gross per 24 hour  Intake 720 ml  Output 2300 ml  Net -1580 ml   Filed Weights   06/13/23 0507 06/14/23 0500 06/16/23 0500  Weight: (!) 158.9 kg (!) 159.2 kg (!) 154.4 kg   Physical Exam:  General this is a 52 year old female who is sitting in bed. She is on room air and no distress however remains  uncomfortable d/t constant NP wet cough  HENT NCAT no JVD MMM does have raised white spots noted on right tonsillar area as well posterior pharynx. Cont to have upper airway predominant wheezing Pulm essentially clear  Card rrr Abd soft Ext warm and dry  Neuro intact     Resolved Hospital Problem list     Assessment & Plan:  Acute hypoxic resp failure Acute bronchitis AECOPD  Copd/asthma overlap  laryngopharyngeal reflux disease Oropharyngeal Candidiasis   Tobacco abuse (stopped 2 weeks ago)  OSA (compliant w/ CPAP)  HTN GERD   Cough due to AECOPD w/  asthma +/- COPD overlap w/ Majority of her symptom burden is due to  LPR with upper airway wheezing/hoarseness which is now being driven by Oropharyngeal Candidiasis   Plan Adding fluconazole  Reminded of importance to rinse mouth after her nebs Dec steroids to daily. Doubt they are adding much Cont aggressive cough suppression Cont nasal antihistamines Cont aggressive reflux rx. Added pepcid at HS  Reflux precautions.  Prob can stop the doxy If not better in 48 hrs next step is esophagram and if not diagnostic ENT eval    Best Practice (right click and "Reselect all SmartList Selections" daily)  Per primary    Critical care time: NA

## 2023-06-17 DIAGNOSIS — J441 Chronic obstructive pulmonary disease with (acute) exacerbation: Secondary | ICD-10-CM | POA: Diagnosis not present

## 2023-06-17 MED ORDER — PREDNISONE 20 MG PO TABS: 20.0000 mg | ORAL_TABLET | Freq: Every day | ORAL | Status: DC

## 2023-06-17 MED ORDER — FLUCONAZOLE 100 MG PO TABS: 100.0000 mg | ORAL_TABLET | Freq: Every day | ORAL | 0 refills | Status: AC

## 2023-06-17 MED ORDER — POTASSIUM CHLORIDE CRYS ER 20 MEQ PO TBCR
40.0000 meq | EXTENDED_RELEASE_TABLET | Freq: Once | ORAL | Status: AC
  Administered 2023-06-17: 40 meq via ORAL
  Filled 2023-06-17: qty 2

## 2023-06-17 MED ORDER — ENOXAPARIN SODIUM 80 MG/0.8ML IJ SOSY
80.0000 mg | PREFILLED_SYRINGE | INTRAMUSCULAR | Status: DC
  Administered 2023-06-17: 80 mg via SUBCUTANEOUS
  Filled 2023-06-17: qty 0.8

## 2023-06-17 MED ORDER — MAGIC MOUTHWASH W/LIDOCAINE
15.0000 mL | Freq: Four times a day (QID) | ORAL | Status: DC
  Administered 2023-06-17 (×3): 15 mL via ORAL
  Filled 2023-06-17 (×6): qty 15

## 2023-06-17 MED ORDER — PREDNISONE 10 MG PO TABS: 10.0000 mg | ORAL_TABLET | Freq: Every day | ORAL | Status: DC

## 2023-06-17 MED ORDER — PREDNISONE 10 MG PO TABS
30.0000 mg | ORAL_TABLET | Freq: Every day | ORAL | Status: DC
  Administered 2023-06-18: 30 mg via ORAL
  Filled 2023-06-17: qty 1

## 2023-06-17 MED ORDER — PREDNISONE 10 MG PO TABS: ORAL_TABLET | ORAL | 0 refills | Status: AC

## 2023-06-17 NOTE — Progress Notes (Signed)
   06/17/23 2257  BiPAP/CPAP/SIPAP  BiPAP/CPAP/SIPAP Pt Type Adult  Reason BIPAP/CPAP not in use Non-compliant (Pt states she does not want to wear cpap tonight.)

## 2023-06-17 NOTE — Progress Notes (Signed)
   06/16/23 2147  BiPAP/CPAP/SIPAP  BiPAP/CPAP/SIPAP Pt Type Adult  BiPAP/CPAP/SIPAP DREAMSTATIOND  Reason BIPAP/CPAP not in use Non-compliant (Pulled unit from room.)  BiPAP/CPAP /SiPAP Vitals  SpO2 97 %  Bilateral Breath Sounds Diminished;Expiratory wheezes   Declined placement second nite, pulled from room.

## 2023-06-17 NOTE — Discharge Summary (Signed)
Physician Discharge Summary  Judy Baker ZOX:[id number] DOB: May 20, 1971 DOA: 06/10/2023  PCP: Suzan Slick, MD  Admit date: 06/10/2023 Discharge date: 06/17/2023  Admitted From: Home Disposition: Home  Recommendations for Outpatient Follow-up:  Follow up with PCP in 1-2 weeks Please obtain BMP/CBC in one week your next doctors visit.  Outpatient pulmonary follow-up Oral steroid taper over 2 weeks Fluconazole until 06/30/2023 Continue home bronchodilators   Discharge Condition: Stable CODE STATUS: Full code code Diet recommendation: Low-salt  Brief/Interim Summary: Brief Narrative:  52 year old morbidly obese with history of HTN, HLD, GERD, tobacco use, obesity, asthma admitted for acute hypoxic respiratory failure secondary to COPD.  During the hospitalization patient received steroids, bronchodilators, short course of doxycycline.  Additionally required intermittent diuresis as well.  There was concerns of oral candidiasis/thrush therefore started on Diflucan as well. Over the course of hospitalization she significantly improved with bronchodilators, steroids and doxycycline.  Also received slight diuretics. Overall medically stable for discharge today  Assessment & Plan:  Principal Problem:   COPD with acute exacerbation (HCC)     Acute Exacerbation of COPD Oral Thrush -On bronchodilators, steroids, I-S/flutter valve.  Pepcid at bedtime.  Finished 5 days of doxycycline.  On fluconazole, EOT 12/30 n.  Taper steroids over next 2 weeks.  Ambulatory pulse ox remains greater than 95% on room air. -Respiratory viral panel negative   Acute Respiratory Failure with Hypoxia Significant improvement.  This morning ambulatory pulse ox greater than 95% on room air  Tobacco Abuse Nicotine patch   Hypertension Resume home medications   Hyperlipidemia -Continue Pravastatin 40 mg po Daily    Depression -C/w Duloxetine 60 mg po Daily    Leukocytosis Secondary to  steroids   Hyponatremia Stable   Hypokalemia As needed repletion   GERD/GI Prophylaxis Protonix and Pepcid   Class III (Superly Morbid) Obesity -Complicates overall prognosis and care -Estimated body mass index is 54.97 kg/m as calculated from the following:   Height as of this encounter: 5\' 7"  (1.702 m).   Weight as of this encounter: 159.2 kg.  -Weight Loss and Dietary Counseling given    DVT prophylaxis: SCDs Start: 06/10/23 1633 Code Status: Full Family Communication:   Status is: Inpatient Remains inpatient appropriate because: Cont hosp stay    Subjective:  Seen at bedside, significantly feeling well.  Examination:  General exam: Appears calm and comfortable  Respiratory system: Clear to auscultation. Respiratory effort normal. Cardiovascular system: S1 & S2 heard, RRR. No JVD, murmurs, rubs, gallops or clicks. No pedal edema. Gastrointestinal system: Abdomen is nondistended, soft and nontender. No organomegaly or masses felt. Normal bowel sounds heard. Central nervous system: Alert and oriented. No focal neurological deficits. Extremities: Symmetric 5 x 5 power. Skin: No rashes, lesions or ulcers Psychiatry: Judgement and insight appear normal. Mood & affect appropriate.    Discharge Diagnoses:  Principal Problem:   COPD with acute exacerbation (HCC)      Discharge Exam: Vitals:   06/17/23 0517 06/17/23 0757  BP: 137/73   Pulse: (!) 56   Resp: 16   Temp: 98.6 F (37 C)   SpO2: 100% 96%   Vitals:   06/16/23 2147 06/17/23 0500 06/17/23 0517 06/17/23 0757  BP:   137/73   Pulse:   (!) 56   Resp:   16   Temp:   98.6 F (37 C)   TempSrc:   Oral   SpO2: 97%  100% 96%  Weight:  (!) 160.8 kg    Height:  Discharge Instructions   Allergies as of 06/17/2023       Reactions   Molds & Smuts    Grass Pollen([id number]-swt Vern)    Allscripts Description: Grass   Penicillins Hives, Swelling   Did it involve swelling of the  face/tongue/throat, SOB, or low BP? Yes Did it involve sudden or severe rash/hives, skin peeling, or any reaction on the inside of your mouth or nose? Yes Did you need to seek medical attention at a hospital or doctor's office? Yes When did it last happen? childhood (Teenage) allergy  If all above answers are "NO", may proceed with cephalosporin use.   Sulfa Antibiotics Swelling        Medication List     STOP taking these medications    levonorgestrel 20 MCG/DAY Iud Commonly known as: MIRENA   methylPREDNISolone 4 MG Tbpk tablet Commonly known as: MEDROL DOSEPAK   traZODone 100 MG tablet Commonly known as: DESYREL       TAKE these medications    acetaminophen 500 MG tablet Commonly known as: TYLENOL Take 2 tablets (1,000 mg total) by mouth every 6 (six) hours as needed.   albuterol 108 (90 Base) MCG/ACT inhaler Commonly known as: VENTOLIN HFA Inhale 2 puffs into the lungs every 6 (six) hours as needed for wheezing or shortness of breath.   albuterol (2.5 MG/3ML) 0.083% nebulizer solution Commonly known as: PROVENTIL Inhale 3 mLs (2.5 mg total) into the lungs every 6 (six) hours as needed for wheezing or shortness of breath.   amLODipine 5 MG tablet Commonly known as: NORVASC Take 5 mg by mouth at bedtime.   azelastine 0.1 % nasal spray Commonly known as: ASTELIN Place 2 sprays into both nostrils 2 (two) times daily as needed (allergies).   budesonide-formoterol 160-4.5 MCG/ACT inhaler Commonly known as: SYMBICORT Inhale 2 puffs into the lungs in the morning and at bedtime.   busPIRone 10 MG tablet Commonly known as: BUSPAR Take 1 tablet (10 mg total) by mouth 2 (two) times daily.   Centrum Silver 50+Women Tabs Take 1 tablet by mouth daily.   cyclobenzaprine 10 MG tablet Commonly known as: FLEXERIL Take 10 mg by mouth 2 (two) times daily as needed for muscle spasms.   DULoxetine 60 MG capsule Commonly known as: CYMBALTA TAKE 1 CAPSULE BY MOUTH EVERY  DAY   fluconazole 100 MG tablet Commonly known as: DIFLUCAN Take 1 tablet (100 mg total) by mouth daily for 12 days. Start taking on: June 18, 2023   fluticasone 50 MCG/ACT nasal spray Commonly known as: FLONASE Place 1 spray into both nostrils 2 (two) times daily as needed for allergies.   furosemide 20 MG tablet Commonly known as: LASIX Take 20 mg by mouth daily as needed for fluid.   HYDROcodone-acetaminophen 10-325 MG tablet Commonly known as: NORCO Take 1 tablet by mouth 2 (two) times daily as needed for severe pain (pain score 7-10).   labetalol 100 MG tablet Commonly known as: NORMODYNE TAKE 1 TABLET TWICE A DAY (HOLD IF TOP BLOOD PRESSURE NUMBER IS LESS THAN 100 OR PULSE LESS THAN 60)   montelukast 10 MG tablet Commonly known as: SINGULAIR Take 10 mg by mouth at bedtime.   nicotine 7 mg/24hr patch Commonly known as: NICODERM CQ - dosed in mg/24 hr Place 1 patch (7 mg total) onto the skin daily.   nitroGLYCERIN 0.4 MG SL tablet Commonly known as: NITROSTAT PLACE 1 TABLET UNDER THE TONGUE EVERY 5 MINUTES AS NEEDED FOR CHEST PAIN.  omeprazole 40 MG capsule Commonly known as: PRILOSEC Take 1 capsule (40 mg total) by mouth daily.   pravastatin 40 MG tablet Commonly known as: PRAVACHOL Take 1 tablet (40 mg total) by mouth daily.   predniSONE 10 MG tablet Commonly known as: DELTASONE Take 3 tablets (30 mg total) by mouth daily with breakfast for 5 days, THEN 2 tablets (20 mg total) daily with breakfast for 5 days, THEN 1 tablet (10 mg total) daily with breakfast for 5 days. Start taking on: June 18, 2023   spironolactone-hydrochlorothiazide 25-25 MG tablet Commonly known as: ALDACTAZIDE Take 1 tablet by mouth daily. Restarted on 01/31/20   tiZANidine 4 MG tablet Commonly known as: ZANAFLEX Take 4 mg by mouth every 8 (eight) hours as needed for muscle spasms.   traMADol 50 MG tablet Commonly known as: ULTRAM Take 50 mg by mouth every 12 (twelve) hours  as needed for moderate pain (pain score 4-6).   zolpidem 10 MG tablet Commonly known as: AMBIEN Take 10 mg by mouth at bedtime as needed for sleep.        Allergies  Allergen Reactions   Molds & Smuts    Grass Pollen(K-O-R-T-Swt Vern)     Allscripts Description: Grass   Penicillins Hives and Swelling    Did it involve swelling of the face/tongue/throat, SOB, or low BP? Yes Did it involve sudden or severe rash/hives, skin peeling, or any reaction on the inside of your mouth or nose? Yes Did you need to seek medical attention at a hospital or doctor's office? Yes When did it last happen? childhood (Teenage) allergy  If all above answers are "NO", may proceed with cephalosporin use.    Sulfa Antibiotics Swelling    You were cared for by a hospitalist during your hospital stay. If you have any questions about your discharge medications or the care you received while you were in the hospital after you are discharged, you can call the unit and asked to speak with the hospitalist on call if the hospitalist that took care of you is not available. Once you are discharged, your primary care physician will handle any further medical issues. Please note that no refills for any discharge medications will be authorized once you are discharged, as it is imperative that you return to your primary care physician (or establish a relationship with a primary care physician if you do not have one) for your aftercare needs so that they can reassess your need for medications and monitor your lab values.  You were cared for by a hospitalist during your hospital stay. If you have any questions about your discharge medications or the care you received while you were in the hospital after you are discharged, you can call the unit and asked to speak with the hospitalist on call if the hospitalist that took care of you is not available. Once you are discharged, your primary care physician will handle any further  medical issues. Please note that NO REFILLS for any discharge medications will be authorized once you are discharged, as it is imperative that you return to your primary care physician (or establish a relationship with a primary care physician if you do not have one) for your aftercare needs so that they can reassess your need for medications and monitor your lab values.  Please request your Prim.MD to go over all Hospital Tests and Procedure/Radiological results at the follow up, please get all Hospital records sent to your Prim MD by signing hospital release  before you go home.  Get CBC, CMP, 2 view Chest X ray checked  by Primary MD during your next visit or SNF MD in 5-7 days ( we routinely change or add medications that can affect your baseline labs and fluid status, therefore we recommend that you get the mentioned basic workup next visit with your PCP, your PCP may decide not to get them or add new tests based on their clinical decision)  On your next visit with your primary care physician please Get Medicines reviewed and adjusted.  If you experience worsening of your admission symptoms, develop shortness of breath, life threatening emergency, suicidal or homicidal thoughts you must seek medical attention immediately by calling 911 or calling your MD immediately  if symptoms less severe.  You Must read complete instructions/literature along with all the possible adverse reactions/side effects for all the Medicines you take and that have been prescribed to you. Take any new Medicines after you have completely understood and accpet all the possible adverse reactions/side effects.   Do not drive, operate heavy machinery, perform activities at heights, swimming or participation in water activities or provide baby sitting services if your were admitted for syncope or siezures until you have seen by Primary MD or a Neurologist and advised to do so again.  Do not drive when taking Pain  medications.   Procedures/Studies: DG CHEST PORT 1 VIEW Result Date: 06/15/2023 CLINICAL DATA:  52 year old female with shortness of breath. EXAM: PORTABLE CHEST 1 VIEW COMPARISON:  Portable chest yesterday and earlier. FINDINGS: Portable AP semi upright view at 0549 hours. Stable borderline to mild cardiomegaly. Other mediastinal contours are within normal limits. Stable lung volumes and ventilation since the chest CT on 06/13/2023. No pneumothorax or confluent lung opacity. Visualized tracheal air column is within normal limits. No acute osseous abnormality identified. IMPRESSION: Stable lung volumes and ventilation since the chest CT on 06/13/2023. No new cardiopulmonary abnormality. Electronically Signed   By: Odessa Fleming M.D.   On: 06/15/2023 08:22   DG CHEST PORT 1 VIEW Result Date: 06/14/2023 CLINICAL DATA:  141880 SOB (shortness of breath) 141880 EXAM: PORTABLE CHEST 1 VIEW COMPARISON:  Chest radiograph from one day prior. FINDINGS: Stable cardiomediastinal silhouette with mild cardiomegaly. No pneumothorax. No significant pleural effusions, noting exclusion of portions of the costophrenic angles from the image. No overt pulmonary edema. No consolidative airspace disease. IMPRESSION: Stable mild cardiomegaly. No overt pulmonary edema. Electronically Signed   By: Delbert Phenix M.D.   On: 06/14/2023 09:55   ECHOCARDIOGRAM COMPLETE Result Date: 06/13/2023    ECHOCARDIOGRAM REPORT   Patient Name:   Judy Baker Date of Exam: 06/13/2023 Medical Rec #:  [id number]     Height:       67.0 in Accession #:    [id number]    Weight:       350.3 lb Date of Birth:  Feb 11, 1971     BSA:          2.566 m Patient Age:    52 years      BP:           166/70 mmHg Patient Gender: F             HR:           73 bpm. Exam Location:  Inpatient Procedure: 2D Echo, Cardiac Doppler and Color Doppler Indications:    Dyspnea  History:        Patient has prior history of Echocardiogram examinations, most  recent  02/20/2021. COPD, Signs/Symptoms:Chest Pain; Risk                 Factors:Dyslipidemia and Hypertension.  Sonographer:    Vern Claude Referring Phys: [id number] OMAIR LATIF [facility] IMPRESSIONS  1. Left ventricular ejection fraction, by estimation, is 55%. The left ventricle has normal function. Left ventricular endocardial border not optimally defined to evaluate regional wall motion. There is mild left ventricular hypertrophy. Left ventricular diastolic parameters are indeterminate.  2. Right ventricular systolic function is normal. The right ventricular size is normal. Tricuspid regurgitation signal is inadequate for assessing PA pressure.  3. The mitral valve is grossly normal. Trivial mitral valve regurgitation. No evidence of mitral stenosis.  4. The aortic valve is tricuspid. Aortic valve regurgitation is not visualized. No aortic stenosis is present.  5. The inferior vena cava is normal in size with greater than 50% respiratory variability, suggesting right atrial pressure of 3 mmHg. FINDINGS  Left Ventricle: Left ventricular ejection fraction, by estimation, is 55%. The left ventricle has normal function. Left ventricular endocardial border not optimally defined to evaluate regional wall motion. The left ventricular internal cavity size was normal in size. There is mild left ventricular hypertrophy. Left ventricular diastolic parameters are indeterminate. Right Ventricle: The right ventricular size is normal. No increase in right ventricular wall thickness. Right ventricular systolic function is normal. Tricuspid regurgitation signal is inadequate for assessing PA pressure. Left Atrium: Left atrial size was normal in size. Right Atrium: Right atrial size was normal in size. Pericardium: There is no evidence of pericardial effusion. Mitral Valve: The mitral valve is grossly normal. Trivial mitral valve regurgitation. No evidence of mitral valve stenosis. MV peak gradient, 2.2 mmHg. The mean mitral valve gradient  is 1.0 mmHg. Tricuspid Valve: The tricuspid valve is grossly normal. Tricuspid valve regurgitation is trivial. No evidence of tricuspid stenosis. Aortic Valve: The aortic valve is tricuspid. Aortic valve regurgitation is not visualized. No aortic stenosis is present. Aortic valve mean gradient measures 5.0 mmHg. Aortic valve peak gradient measures 10.5 mmHg. Aortic valve area, by VTI measures 2.73  cm. Pulmonic Valve: The pulmonic valve was grossly normal. Pulmonic valve regurgitation is trivial. No evidence of pulmonic stenosis. Aorta: The aortic root is normal in size and structure. Venous: The inferior vena cava is normal in size with greater than 50% respiratory variability, suggesting right atrial pressure of 3 mmHg. IAS/Shunts: The interatrial septum was not well visualized.  LEFT VENTRICLE PLAX 2D LVIDd:         5.00 cm      Diastology LVIDs:         3.40 cm      LV e' medial:    5.66 cm/s LV PW:         1.20 cm      LV E/e' medial:  13.8 LV IVS:        1.00 cm      LV e' lateral:   7.94 cm/s LVOT diam:     2.20 cm      LV E/e' lateral: 9.8 LV SV:         80 LV SV Index:   31 LVOT Area:     3.80 cm  LV Volumes (MOD) LV vol d, MOD A2C: 129.0 ml LV vol d, MOD A4C: 186.0 ml LV vol s, MOD A2C: 51.2 ml LV vol s, MOD A4C: 92.0 ml LV SV MOD A2C:     77.8 ml LV SV MOD A4C:  186.0 ml LV SV MOD BP:      90.3 ml RIGHT VENTRICLE             IVC RV Basal diam:  3.50 cm     IVC diam: 1.00 cm RV Mid diam:    2.50 cm RV S prime:     10.70 cm/s TAPSE (M-mode): 3.0 cm LEFT ATRIUM             Index        RIGHT ATRIUM           Index LA diam:        3.10 cm 1.21 cm/m   RA Area:     15.50 cm LA Vol (A2C):   56.4 ml 21.98 ml/m  RA Volume:   36.80 ml  14.34 ml/m LA Vol (A4C):   34.1 ml 13.29 ml/m LA Biplane Vol: 44.9 ml 17.50 ml/m  AORTIC VALVE                    PULMONIC VALVE AV Area (Vmax):    2.35 cm     PV Vmax:       0.86 m/s AV Area (Vmean):   2.19 cm     PV Peak grad:  2.9 mmHg AV Area (VTI):     2.73 cm AV  Vmax:           162.00 cm/s AV Vmean:          98.300 cm/s AV VTI:            0.294 m AV Peak Grad:      10.5 mmHg AV Mean Grad:      5.0 mmHg LVOT Vmax:         100.35 cm/s LVOT Vmean:        56.600 cm/s LVOT VTI:          0.212 m LVOT/AV VTI ratio: 0.72  AORTA Ao Root diam: 3.10 cm Ao Asc diam:  2.90 cm MITRAL VALVE MV Area (PHT): 3.03 cm    SHUNTS MV Area VTI:   3.38 cm    Systemic VTI:  0.21 m MV Peak grad:  2.2 mmHg    Systemic Diam: 2.20 cm MV Mean grad:  1.0 mmHg MV Vmax:       0.74 m/s MV Vmean:      55.7 cm/s MV Decel Time: 250 msec MV E velocity: 78.20 cm/s MV A velocity: 83.70 cm/s MV E/A ratio:  0.93 Weston Brass MD Electronically signed by Weston Brass MD Signature Date/Time: 06/13/2023/7:46:03 PM    Final    CT CHEST W CONTRAST Result Date: 06/13/2023 CLINICAL DATA:  Pneumonia, complication suspected, xray done EXAM: CT CHEST WITH CONTRAST TECHNIQUE: Multidetector CT imaging of the chest was performed during intravenous contrast administration. RADIATION DOSE REDUCTION: This exam was performed according to the departmental dose-optimization program which includes automated exposure control, adjustment of the mA and/or kV according to patient size and/or use of iterative reconstruction technique. CONTRAST:  75mL OMNIPAQUE IOHEXOL 300 MG/ML  SOLN COMPARISON:  X-ray 06/13/2023, CT 10/12/2022 FINDINGS: Cardiovascular: Heart size is normal. No pericardial effusion. Thoracic aorta is nonaneurysmal. Central pulmonary vasculature is within normal limits. Mediastinum/Nodes: No enlarged mediastinal, hilar, or axillary lymph nodes. Thyroid gland, trachea, and esophagus demonstrate no significant findings. Lungs/Pleura: Mild subsegmental atelectasis within the lingula and medial right lung base. There are a few subtle areas of peripheral ground-glass opacity within the right upper lobe and to a lesser degree  within the periphery of the left upper lobe. No pleural effusion. No pneumothorax. Upper  Abdomen: Small diverticulum arising from the posterior aspect of the gastric fundus. No acute abnormality. Musculoskeletal: No chest wall abnormality. No acute or significant osseous findings. IMPRESSION: 1. There are a few subtle areas of peripheral ground-glass opacity within the right upper lobe and to a lesser degree within the periphery of the left upper lobe. Findings are nonspecific but may represent a mild infectious or inflammatory process. 2. Mild subsegmental atelectasis within the lingula and medial right lung base. Electronically Signed   By: Duanne Guess D.O.   On: 06/13/2023 14:23   DG CHEST PORT 1 VIEW Result Date: 06/13/2023 CLINICAL DATA:  52 year old female with shortness of breath. EXAM: PORTABLE CHEST 1 VIEW COMPARISON:  Portable chest yesterday and earlier. FINDINGS: Portable AP semi upright view at 0453 hours. Stable cardiomegaly and mediastinal contours. Stable pulmonary vascular congestion. Visualized tracheal air column is within normal limits. Stable mild thoracic aorta tortuosity, mediastinal contours. No pneumothorax, pleural effusion or confluent opacity. No acute osseous abnormality identified. Paucity of bowel gas in the visible abdomen. IMPRESSION: Stable symmetric pulmonary interstitial opacity which could be interstitial edema, viral or atypical respiratory infection. Electronically Signed   By: Odessa Fleming M.D.   On: 06/13/2023 08:24   DG CHEST PORT 1 VIEW Result Date: 06/12/2023 CLINICAL DATA:  141880 SOB (shortness of breath) [id number] EXAM: PORTABLE CHEST 1 VIEW COMPARISON:  June 10, 2023 FINDINGS: Incomplete assessment of the costophrenic angles. The cardiomediastinal silhouette is unchanged and enlarged in contour. Possible small RIGHT pleural effusion. No pneumothorax. Favored overall increased interstitial markings with peribronchial cuffing and vascular indistinctness. IMPRESSION: Constellation of findings are favored to reflect pulmonary edema. Electronically  Signed   By: Meda Klinefelter M.D.   On: 06/12/2023 07:58   DG Chest Portable 1 View Result Date: 06/10/2023 CLINICAL DATA:  Shortness of breath with chest tightness, cough and wheezing x 5 EXAM: PORTABLE CHEST 1 VIEW COMPARISON:  Radiographs 05/13/2019 and 03/01/2019.  CT 10/12/2022. FINDINGS: 1340 hours. Two views are submitted. The heart size and mediastinal contours are stable. Mildly increased atelectasis at both lung bases. No confluent airspace disease, significant pleural effusion or pneumothorax. The bones appear unchanged. Telemetry leads overlie the chest. IMPRESSION: Mildly increased bibasilar atelectasis. No other acute cardiopulmonary process. Electronically Signed   By: Carey Bullocks M.D.   On: 06/10/2023 15:17   MR SHOULDER RIGHT WO CONTRAST Result Date: 05/27/2023 CLINICAL DATA:  Rotator cuff tear arthropathy. Chronic. Worsening over the past 2 weeks. Weakness. EXAM: MRI OF THE RIGHT SHOULDER WITHOUT CONTRAST TECHNIQUE: Multiplanar, multisequence MR imaging of the shoulder was performed. No intravenous contrast was administered. COMPARISON:  None Available. FINDINGS: Rotator cuff: There is moderate intermediate T2 signal and mild thickening tendinosis of the anterior greater than posterior supraspinatus tendon footprint. No fluid bright tear or tendon retraction. The infraspinatus is intact. Mild superior subscapularis intermediate T2 signal tendinosis with possible minimal superior degenerative fraying (sagittal series 109 images 7 and 8). The teres minor is intact. Muscles: No rotator cuff muscle atrophy, fatty infiltration, or edema. Biceps long head: Minimal intermediate T2 signal proximal long head of the biceps tendinosis. Acromioclavicular Joint: There are moderate degenerative changes of the acromioclavicular joint including joint space narrowing, subchondral marrow edema, and peripheral osteophytosis. Type III acromion, with mild downsloping of the anterolateral acromion. No  fluid within the subacromial/subdeltoid bursa. Glenohumeral Joint: Moderate glenohumeral cartilage thinning, greatest within the mid and anterior glenoid. There is  focal full-thickness cartilage loss within a 6 mm craniocaudal portion of the superomedial humeral head (coronal series 107, image 9). Mild glenohumeral joint effusion. Labrum: Mild peripheral degenerative fraying of the superior glenoid labrum. Bones: No acute fracture. Mild subcortical cystic change within the posterior superolateral humeral head deep to the posterior infraspinatus tendon insertion. Other: None. IMPRESSION: 1. Moderate anterior greater than posterior supraspinatus tendinosis. No fluid bright tear or tendon retraction. 2. Mild superior subscapularis tendinosis with possible minimal superior degenerative fraying. 3. Minimal proximal long head of the biceps tendinosis. 4. Moderate degenerative changes of the acromioclavicular joint. Type III acromion, with mild downsloping of the anterolateral acromion. 5. Moderate glenohumeral cartilage thinning. Electronically Signed   By: Neita Garnet M.D.   On: 05/27/2023 18:09     The results of significant diagnostics from this hospitalization (including imaging, microbiology, ancillary and laboratory) are listed below for reference.     Microbiology: Recent Results (from the past 240 hours)  Resp panel by RT-PCR (RSV, Flu A&B, Covid) Anterior Nasal Swab     Status: None   Collection Time: 06/10/23  2:26 PM   Specimen: Anterior Nasal Swab  Result Value Ref Range Status   SARS Coronavirus 2 by RT PCR NEGATIVE NEGATIVE Final    Comment: (NOTE) SARS-CoV-2 target nucleic acids are NOT DETECTED.  The SARS-CoV-2 RNA is generally detectable in upper respiratory specimens during the acute phase of infection. The lowest concentration of SARS-CoV-2 viral copies this assay can detect is 138 copies/mL. A negative result does not preclude SARS-Cov-2 infection and should not be used as the  sole basis for treatment or other patient management decisions. A negative result may occur with  improper specimen collection/handling, submission of specimen other than nasopharyngeal swab, presence of viral mutation(s) within the areas targeted by this assay, and inadequate number of viral copies(<138 copies/mL). A negative result must be combined with clinical observations, patient history, and epidemiological information. The expected result is Negative.  Fact Sheet for Patients:  BloggerCourse.com  Fact Sheet for Healthcare Providers:  SeriousBroker.it  This test is no t yet approved or cleared by the [place] FDA and  has been authorized for detection and/or diagnosis of SARS-CoV-2 by FDA under an Emergency Use Authorization (EUA). This EUA will remain  in effect (meaning this test can be used) for the duration of the COVID-19 declaration under Section 564(b)(1) of the Act, 21 U.S.C.section 360bbb-3(b)(1), unless the authorization is terminated  or revoked sooner.       Influenza A by PCR NEGATIVE NEGATIVE Final   Influenza B by PCR NEGATIVE NEGATIVE Final    Comment: (NOTE) The Xpert Xpress SARS-CoV-2/FLU/RSV plus assay is intended as an aid in the diagnosis of influenza from Nasopharyngeal swab specimens and should not be used as a sole basis for treatment. Nasal washings and aspirates are unacceptable for Xpert Xpress SARS-CoV-2/FLU/RSV testing.  Fact Sheet for Patients: BloggerCourse.com  Fact Sheet for Healthcare Providers: SeriousBroker.it  This test is not yet approved or cleared by the [place] FDA and has been authorized for detection and/or diagnosis of SARS-CoV-2 by FDA under an Emergency Use Authorization (EUA). This EUA will remain in effect (meaning this test can be used) for the duration of the COVID-19 declaration under Section 564(b)(1) of the  Act, 21 U.S.C. section 360bbb-3(b)(1), unless the authorization is terminated or revoked.     Resp Syncytial Virus by PCR NEGATIVE NEGATIVE Final    Comment: (NOTE) Fact Sheet for Patients: BloggerCourse.com  Fact Sheet for  Healthcare Providers: SeriousBroker.it  This test is not yet approved or cleared by the [place] and has been authorized for detection and/or diagnosis of SARS-CoV-2 by FDA under an Emergency Use Authorization (EUA). This EUA will remain in effect (meaning this test can be used) for the duration of the COVID-19 declaration under Section 564(b)(1) of the Act, 21 U.S.C. section 360bbb-3(b)(1), unless the authorization is terminated or revoked.  Performed at [facility], [address] [place], [place] [address]   Respiratory (~20 pathogens) panel by PCR     Status: None   Collection Time: 06/11/23  3:00 PM  Result Value Ref Range Status   Adenovirus NOT DETECTED NOT DETECTED Final   Coronavirus 229E NOT DETECTED NOT DETECTED Final    Comment: (NOTE) The Coronavirus on the Respiratory Panel, DOES NOT test for the novel  Coronavirus (2019 nCoV)    Coronavirus HKU1 NOT DETECTED NOT DETECTED Final   Coronavirus NL63 NOT DETECTED NOT DETECTED Final   Coronavirus OC43 NOT DETECTED NOT DETECTED Final   Metapneumovirus NOT DETECTED NOT DETECTED Final   Rhinovirus / Enterovirus NOT DETECTED NOT DETECTED Final   Influenza A NOT DETECTED NOT DETECTED Final   Influenza B NOT DETECTED NOT DETECTED Final   Parainfluenza Virus 1 NOT DETECTED NOT DETECTED Final   Parainfluenza Virus 2 NOT DETECTED NOT DETECTED Final   Parainfluenza Virus 3 NOT DETECTED NOT DETECTED Final   Parainfluenza Virus 4 NOT DETECTED NOT DETECTED Final   Respiratory Syncytial Virus NOT DETECTED NOT DETECTED Final   Bordetella pertussis NOT DETECTED NOT DETECTED Final   Bordetella Parapertussis NOT DETECTED NOT  DETECTED Final   Chlamydophila pneumoniae NOT DETECTED NOT DETECTED Final   Mycoplasma pneumoniae NOT DETECTED NOT DETECTED Final    Comment: Performed at [facility], [address] [place], [place] [address]     Labs: BNP (last 3 results) Recent Labs    06/12/23 0527  BNP 72.3   Basic Metabolic Panel: Recent Labs  Lab 06/12/23 0527 06/13/23 0521 06/14/23 0632 06/15/23 0542 06/16/23 0516  NA 137 137 135 134* 135  K 4.0 3.6 3.7 3.6 3.4*  CL 102 100 100 99 100  CO2 24 27 29 27 28   GLUCOSE 126* 116* 107* 138* 117*  BUN 27* 24* 26* 32* 29*  CREATININE 0.79 0.77 0.84 0.85 0.95  CALCIUM 9.7 9.4 9.1 9.2 9.4  MG 2.2 2.1 2.0 2.1 2.1  PHOS 3.7 3.6 3.5 3.7 3.9   Liver Function Tests: Recent Labs  Lab 06/12/23 0527 06/13/23 0521 06/14/23 0632 06/15/23 0542 06/16/23 0516  AST 18 15 17 17 17   ALT 30 28 31  34 35  ALKPHOS 74 67 66 72 68  BILITOT 0.4 0.3 0.5 0.3 0.4  PROT 7.5 7.3 6.9 7.0 6.8  ALBUMIN 3.7 3.6 3.4* 3.4* 3.3*   No results for input(s): "LIPASE", "AMYLASE" in the last 168 hours. No results for input(s): "AMMONIA" in the last 168 hours. CBC: Recent Labs  Lab 06/12/23 0527 06/13/23 0521 06/14/23 0632 06/15/23 0542 06/16/23 0516  WBC 10.7* 10.6* 11.8* 14.0* 14.1*  NEUTROABS 8.6* 8.5* 7.9* 9.9* 10.3*  HGB 14.7 15.1* 15.0 14.9 15.2*  HCT 45.4 45.1 45.7 45.9 46.4*  MCV 98.3 95.3 96.4 97.7 96.3  PLT 235 248 244 295 322   Cardiac Enzymes: No results for input(s): "CKTOTAL", "CKMB", "CKMBINDEX", "TROPONINI" in the last 168 hours. BNP: Invalid input(s): "POCBNP" CBG: Recent Labs  Lab 06/13/23 0640  GLUCAP 118*  D-Dimer No results for input(s): "DDIMER" in the last 72 hours. Hgb A1c No results for input(s): "HGBA1C" in the last 72 hours. Lipid Profile No results for input(s): "CHOL", "HDL", "LDLCALC", "TRIG", "CHOLHDL", "LDLDIRECT" in the last 72 hours. Thyroid function studies No results for input(s): "TSH", "T4TOTAL", "T3FREE", "THYROIDAB"  in the last 72 hours.  Invalid input(s): "FREET3" Anemia work up No results for input(s): "VITAMINB12", "FOLATE", "FERRITIN", "TIBC", "IRON", "RETICCTPCT" in the last 72 hours. Urinalysis No results found for: "COLORURINE", "APPEARANCEUR", "LABSPEC", "PHURINE", "GLUCOSEU", "HGBUR", "BILIRUBINUR", "KETONESUR", "PROTEINUR", "UROBILINOGEN", "NITRITE", "LEUKOCYTESUR" Sepsis Labs Recent Labs  Lab 06/13/23 0521 06/14/23 0632 06/15/23 0542 06/16/23 0516  WBC 10.6* 11.8* 14.0* 14.1*   Microbiology Recent Results (from the past 240 hours)  Resp panel by RT-PCR (RSV, Flu A&B, Covid) Anterior Nasal Swab     Status: None   Collection Time: 06/10/23  2:26 PM   Specimen: Anterior Nasal Swab  Result Value Ref Range Status   SARS Coronavirus 2 by RT PCR NEGATIVE NEGATIVE Final    Comment: (NOTE) SARS-CoV-2 target nucleic acids are NOT DETECTED.  The SARS-CoV-2 RNA is generally detectable in upper respiratory specimens during the acute phase of infection. The lowest concentration of SARS-CoV-2 viral copies this assay can detect is 138 copies/mL. A negative result does not preclude SARS-Cov-2 infection and should not be used as the sole basis for treatment or other patient management decisions. A negative result may occur with  improper specimen collection/handling, submission of specimen other than nasopharyngeal swab, presence of viral mutation(s) within the areas targeted by this assay, and inadequate number of viral copies(<138 copies/mL). A negative result must be combined with clinical observations, patient history, and epidemiological information. The expected result is Negative.  Fact Sheet for Patients:  BloggerCourse.com  Fact Sheet for Healthcare Providers:  SeriousBroker.it  This test is no t yet approved or cleared by the [place] FDA and  has been authorized for detection and/or diagnosis of SARS-CoV-2 by FDA under an  Emergency Use Authorization (EUA). This EUA will remain  in effect (meaning this test can be used) for the duration of the COVID-19 declaration under Section 564(b)(1) of the Act, 21 U.S.C.section 360bbb-3(b)(1), unless the authorization is terminated  or revoked sooner.       Influenza A by PCR NEGATIVE NEGATIVE Final   Influenza B by PCR NEGATIVE NEGATIVE Final    Comment: (NOTE) The Xpert Xpress SARS-CoV-2/FLU/RSV plus assay is intended as an aid in the diagnosis of influenza from Nasopharyngeal swab specimens and should not be used as a sole basis for treatment. Nasal washings and aspirates are unacceptable for Xpert Xpress SARS-CoV-2/FLU/RSV testing.  Fact Sheet for Patients: BloggerCourse.com  Fact Sheet for Healthcare Providers: SeriousBroker.it  This test is not yet approved or cleared by the [place] FDA and has been authorized for detection and/or diagnosis of SARS-CoV-2 by FDA under an Emergency Use Authorization (EUA). This EUA will remain in effect (meaning this test can be used) for the duration of the COVID-19 declaration under Section 564(b)(1) of the Act, 21 U.S.C. section 360bbb-3(b)(1), unless the authorization is terminated or revoked.     Resp Syncytial Virus by PCR NEGATIVE NEGATIVE Final    Comment: (NOTE) Fact Sheet for Patients: BloggerCourse.com  Fact Sheet for Healthcare Providers: SeriousBroker.it  This test is not yet approved or cleared by the [place] FDA and has been authorized for detection and/or diagnosis of SARS-CoV-2 by FDA under an Emergency Use Authorization (EUA). This EUA will remain in  effect (meaning this test can be used) for the duration of the COVID-19 declaration under Section 564(b)(1) of the Act, 21 U.S.C. section 360bbb-3(b)(1), unless the authorization is terminated or revoked.  Performed at [facility], [address], [place], [place] [address]   Respiratory (~20 pathogens) panel by PCR     Status: None   Collection Time: 06/11/23  3:00 PM  Result Value Ref Range Status   Adenovirus NOT DETECTED NOT DETECTED Final   Coronavirus 229E NOT DETECTED NOT DETECTED Final    Comment: (NOTE) The Coronavirus on the Respiratory Panel, DOES NOT test for the novel  Coronavirus (2019 nCoV)    Coronavirus HKU1 NOT DETECTED NOT DETECTED Final   Coronavirus NL63 NOT DETECTED NOT DETECTED Final   Coronavirus OC43 NOT DETECTED NOT DETECTED Final   Metapneumovirus NOT DETECTED NOT DETECTED Final   Rhinovirus / Enterovirus NOT DETECTED NOT DETECTED Final   Influenza A NOT DETECTED NOT DETECTED Final   Influenza B NOT DETECTED NOT DETECTED Final   Parainfluenza Virus 1 NOT DETECTED NOT DETECTED Final   Parainfluenza Virus 2 NOT DETECTED NOT DETECTED Final   Parainfluenza Virus 3 NOT DETECTED NOT DETECTED Final   Parainfluenza Virus 4 NOT DETECTED NOT DETECTED Final   Respiratory Syncytial Virus NOT DETECTED NOT DETECTED Final   Bordetella pertussis NOT DETECTED NOT DETECTED Final   Bordetella Parapertussis NOT DETECTED NOT DETECTED Final   Chlamydophila pneumoniae NOT DETECTED NOT DETECTED Final   Mycoplasma pneumoniae NOT DETECTED NOT DETECTED Final    Comment: Performed at [facility], [address], [place], [place] [address]     Time coordinating discharge:  I have spent 35 minutes face to face with the patient and on the ward discussing the patients care, assessment, plan and disposition with other care givers. >50% of the time was devoted counseling the patient about the risks and benefits of treatment/Discharge disposition and coordinating care.   SIGNED:   Miguel Rota, MD  [facility] 06/17/2023, 12:47 PM   If 7PM-7AM, please contact night-coverage

## 2023-06-17 NOTE — Progress Notes (Signed)
   NAME:  Judy Baker, MRN:  [id number], DOB:  14-Nov-1970, LOS: 6 ADMISSION DATE:  06/10/2023, CONSULTATION DATE:  12/12 REFERRING MD:  Marland Mcalpine, CHIEF COMPLAINT:  shortness of breath and cough    History of Present Illness:  66 year year old female w/ h/o tobacco abuse, OSA, HTN, angina., asthma. Presented to ER 12/10 w/ 5-6d h/o increased nasal congestion, prod cough w/ greay sputum and worsening shortness of breath and chest tightness.   Room air sats 80s on arrival to ER. PCXR w/ increased interstitial markings.   Admitted w/ working dx of AECOPD. Placed on oxygen, nebs, systemic steroids and supportive care.  Given CXR findings raising concern for edema was also administered lasix on 12/12. ECHO ordered and pending.  BNP was 72, RVP was negative, influenza and covid neg, HIV AB neg  Pertinent  Medical History  OSA, obesity,  hypertension, GERD, tobacco abuse, HL, asthma   Significant Hospital Events: Including procedures, antibiotic start and stop dates in addition to other pertinent events   12/11 admitted started on Doxy, systemic steroids and nebs 12/12 got lasix for possible component of edema 12/13 pccm consulted ECHO pending CT pending no clear PE 12/16 still w/ marked vocal hoarseness and now w/ sore throat starting rx for oropharyngeal thrush    Interim History / Subjective:  Throat more sore. Cough about the same. Still w/ sig vocal hoarseness   Objective   Blood pressure 137/73, pulse (!) 56, temperature 98.6 F (37 C), temperature source Oral, resp. rate 16, height 5\' 7"  (1.702 m), weight (!) 160.8 kg, SpO2 96%.       No intake or output data in the 24 hours ending 06/17/23 0944  Filed Weights   06/14/23 0500 06/16/23 0500 06/17/23 0500  Weight: (!) 159.2 kg (!) 154.4 kg (!) 160.8 kg   Physical Exam:  General this is a pleasant 52 year old female who is laying in bed. No acute distress but still has sig cough HENT NCAT throat sore. Marked upper airway  wheezing Pulm dec bases  Card rrr Abd soft Ext warm  Neuro intact     Resolved Hospital Problem list     Assessment & Plan:  Acute hypoxic resp failure Acute bronchitis AECOPD  Copd/asthma overlap  laryngopharyngeal reflux disease Oropharyngeal Candidiasis   Tobacco abuse (stopped 2 weeks ago)  OSA (compliant w/ CPAP)  HTN GERD   Cough due to AECOPD w/  asthma +/- COPD overlap w/ Majority of her symptom burden is due to  LPR with upper airway wheezing/hoarseness which is now being driven by Oropharyngeal Candidiasis    Plan Day 2 fluconazole (would rx for 10-14d)  Adding magic MW w/ Lido to assist w/ discomfort  Reminded of importance to rinse mouth after her nebs Change steroids to oral w/ slow taper  Cont aggressive cough suppression Cont nasal antihistamines Cont aggressive reflux rx. Added pepcid at HS  Reflux precautions.     Best Practice (right click and "Reselect all SmartList Selections" daily)  Per primary    Critical care time: NA

## 2023-06-18 ENCOUNTER — Telehealth: Payer: Self-pay | Admitting: Acute Care

## 2023-06-18 ENCOUNTER — Telehealth (HOSPITAL_BASED_OUTPATIENT_CLINIC_OR_DEPARTMENT_OTHER): Payer: Self-pay | Admitting: Pulmonary Disease

## 2023-06-18 DIAGNOSIS — J441 Chronic obstructive pulmonary disease with (acute) exacerbation: Secondary | ICD-10-CM | POA: Diagnosis not present

## 2023-06-18 MED ORDER — POTASSIUM CHLORIDE CRYS ER 20 MEQ PO TBCR
40.0000 meq | EXTENDED_RELEASE_TABLET | Freq: Once | ORAL | Status: AC
  Administered 2023-06-18: 40 meq via ORAL
  Filled 2023-06-18: qty 2

## 2023-06-18 NOTE — Plan of Care (Signed)
  Problem: Activity: Goal: Risk for activity intolerance will decrease Outcome: Progressing   Problem: Nutrition: Goal: Adequate nutrition will be maintained Outcome: Progressing   

## 2023-06-18 NOTE — Telephone Encounter (Signed)
Patient has been scheduled and letter has been mailed.

## 2023-06-18 NOTE — Progress Notes (Signed)
Seen and examined at bedside, no complaints Vital signs are overall stable. Patient discharged yesterday and also summary completed on 12/17.  She had stayed overnight due to transportation issue.  Stephania Fragmin MD

## 2023-06-18 NOTE — Telephone Encounter (Signed)
Will arrange follow-up with me or Dr. Vassie Loll at [facility] for hospital follow-up

## 2023-06-18 NOTE — Progress Notes (Signed)
   NAME:  Judy Baker, MRN:  [id number], DOB:  27-May-1971, LOS: 7 ADMISSION DATE:  06/10/2023, CONSULTATION DATE:  12/12 REFERRING MD:  Marland Mcalpine, CHIEF COMPLAINT:  shortness of breath and cough    History of Present Illness:  52 year year old female w/ h/o tobacco abuse, OSA, HTN, angina., asthma. Presented to ER 12/10 w/ 5-6d h/o increased nasal congestion, prod cough w/ greay sputum and worsening shortness of breath and chest tightness.   Room air sats 80s on arrival to ER. PCXR w/ increased interstitial markings.   Admitted w/ working dx of AECOPD. Placed on oxygen, nebs, systemic steroids and supportive care.  Given CXR findings raising concern for edema was also administered lasix on 12/12. ECHO ordered and pending.  BNP was 72, RVP was negative, influenza and covid neg, HIV AB neg  Pertinent  Medical History  OSA, obesity,  hypertension, GERD, tobacco abuse, HL, asthma   Significant Hospital Events: Including procedures, antibiotic start and stop dates in addition to other pertinent events   12/11 admitted started on Doxy, systemic steroids and nebs 12/12 got lasix for possible component of edema 12/13 pccm consulted ECHO pending CT pending no clear PE 12/16 still w/ marked vocal hoarseness and now w/ sore throat starting rx for oropharyngeal thrush    Interim History / Subjective:  Throat more sore. Cough about the same. Still w/ sig vocal hoarseness   Objective   Blood pressure (!) 157/74, pulse 66, temperature 98.3 F (36.8 C), resp. rate 16, height 5\' 7"  (1.702 m), weight (!) 160.8 kg, SpO2 98%.       No intake or output data in the 24 hours ending 06/18/23 1052  Filed Weights   06/14/23 0500 06/16/23 0500 06/17/23 0500  Weight: (!) 159.2 kg (!) 154.4 kg (!) 160.8 kg   Physical Exam:  General 52 year old female sitting up in bed no distress HENT still w/ sig UAW wheezing but no longer has any raised white areas of tonsil area or visible post pharynx  Pulm clear w/ upper  airway transmitted wz Card rrr Abd soft Ext warm and dry  Neuro intact    Resolved Hospital Problem list     Assessment & Plan:  Acute hypoxic resp failure Acute bronchitis AECOPD  Copd/asthma overlap  laryngopharyngeal reflux disease Oropharyngeal Candidiasis   Tobacco abuse (stopped 2 weeks ago)  OSA (compliant w/ CPAP)  HTN GERD   Cough due to AECOPD w/  asthma +/- COPD overlap w/ Majority of her symptom burden is due to  LPR with upper airway wheezing/hoarseness which is now being driven by Oropharyngeal Candidiasis   -she is finally a little better. The cough will cont to improve w/ time   Plan Day 3 fluconazole (would rx for 10-14d)  Adding magic MW w/ Lido to assist w/ discomfort  Reminded of importance to rinse mouth after her nebs Taper steroids Cont nasal antihistamines Cont aggressive reflux rx. Added pepcid at HS  Reflux precautions.   Will arrange f/u with [place]   Best Practice (right click and "Reselect all SmartList Selections" daily)  Per primary    Critical care time: NA

## 2023-06-20 ENCOUNTER — Telehealth: Payer: Self-pay | Admitting: Family Medicine

## 2023-06-20 NOTE — Telephone Encounter (Signed)
Copied from CRM [id number]. Topic: Referral - Question >> Jun 19, 2023  3:40 PM Louie Casa B wrote: Reason for CRM: pt calling for update on her referral for home healthcare call pt back @[phone] 

## 2023-06-26 ENCOUNTER — Telehealth: Payer: Self-pay

## 2023-06-26 DIAGNOSIS — R110 Nausea: Secondary | ICD-10-CM

## 2023-06-26 MED ORDER — ONDANSETRON 4 MG PO TBDP: 4.0000 mg | ORAL_TABLET | Freq: Three times a day (TID) | ORAL | 0 refills | Status: AC | PRN

## 2023-06-26 NOTE — Telephone Encounter (Signed)
Spoke with patient and patient states her symptoms started two days ago. She also stated that she is taking Diflucan and states that she has 3 more days left to take medication, patient is also taking prednisone and has 4 more days to finish medication.    Please advise

## 2023-06-26 NOTE — Telephone Encounter (Signed)
Copied from CRM [id number]. Topic: Clinical - Medical Advice >> Jun 26, 2023 10:59 AM Fonda Kinder J wrote: Reason for CRM: Pt is wanting to know if Dr. Wyline Mood can prescribe her something for diarrhea and nausea, she says none of the over the counter medications have been working

## 2023-06-26 NOTE — Addendum Note (Signed)
Addended by: Suzan Slick on: 06/26/2023 05:34 PM   Modules accepted: Orders

## 2023-06-26 NOTE — Telephone Encounter (Signed)
Sent Zofran in for nausea. She may have stomach virus. Will take 7-10 days to resolve, if this is the cause. You don't want to stop the diarrhea if it's a virus. Stay hydrated with water and electrolyte replacement drinks.

## 2023-06-26 NOTE — Telephone Encounter (Signed)
Need more information. How long has pt had symptoms? Any recent antibiotic usage?

## 2023-06-30 NOTE — Telephone Encounter (Signed)
Referral, clinical notes, demographics and copies of insurance cards have been faxed to [facility] at [phone]. Office will contact patient to schedule referral appointment.

## 2023-07-01 NOTE — Telephone Encounter (Signed)
 07/01/23-DHB-3051 form completed by Dr. Wyline Mood and faxed to [facility] at [phone] with a copy of recent clinic notes and insurance cards.

## 2023-07-01 NOTE — Telephone Encounter (Signed)
 07/01/23- Spoke with Starlyn Skeans, office is currently out of network with patients insurance. Candy also mentioned that patient would need primary discipline other than HHA in order to get insurance to pay for services.

## 2023-07-01 NOTE — Telephone Encounter (Signed)
 06/30/23-Contacted [facility] (spoke with Dorothe)- Company has referral but no authorization for services. Normally DHB-3051 form needs to be completed and sent to insurance in order to get authorization for services. Dorothe recommended office or patient contact patients insurance company to get information on their approval process for HHA.   Dorothe also states this is a lengthy process to get prior authorization for HHA. Orders for OT, PT and nursing services could be written so patient could get services until we can get approval for HHA.

## 2023-07-03 ENCOUNTER — Other Ambulatory Visit: Payer: Self-pay

## 2023-07-03 ENCOUNTER — Inpatient Hospital Stay: Payer: Medicaid Other | Admitting: Family Medicine

## 2023-07-03 NOTE — Patient Outreach (Signed)
  Medicaid Managed Care Social Work Note  07/03/2023 Name:  Judy Baker MRN:  [id number] DOB:  Sep 27, 1970  Judy Baker is an 53 y.o. year old female who is a primary patient of Rucker, Torrence GRADE, MD.  The Medicaid Managed Care Coordination team was consulted for assistance with:  Community Resources   Ms. Wahler was given information about Medicaid Managed Care Coordination team services today. Judy Launa Roses Patient agreed to services and verbal consent obtained.  Engaged with patient  for by telephone forfollow up visit in response to referral for case management and/or care coordination services.   Patient is participating in a Managed Medicaid Plan:  Yes  Assessments/Interventions:  Review of past medical history, allergies, medications, health status, including review of consultants reports, laboratory and other test data, was performed as part of comprehensive evaluation and provision of chronic care management services.  SDOH: (Social Drivers of Health) assessments and interventions performed: SDOH Interventions    Flowsheet Row ED to Hosp-Admission (Discharged) from 06/10/2023 in [facility] Office Visit from 04/17/2023 in [facility] Care at [facility] Visit from 03/13/2023 in [facility] at [facility]  SDOH Interventions     Transportation Interventions Inpatient TOC, Other (Comment)  [Resource placed on AVS] AMB Referral --  Depression Interventions/Treatment  -- -- Referral to Psychiatry  Financial Strain Interventions -- [id number] Referral --      BSW completed a telephone outreach with patient, she states she continues to check, but no one has any funds. BSW will continue to follow up with patient. She states she did purchase a space heater.  Advanced Directives Status:  Not addressed in this encounter.  Care Plan                 Allergies  Allergen Reactions   Molds &  Smuts    Grass Pollen(K-O-R-T-Swt Vern)     Allscripts Description: Grass   Penicillins Hives and Swelling    Did it involve swelling of the face/tongue/throat, SOB, or low BP? Yes Did it involve sudden or severe rash/hives, skin peeling, or any reaction on the inside of your mouth or nose? Yes Did you need to seek medical attention at a hospital or doctor's office? Yes When did it last happen? childhood (Teenage) allergy  If all above answers are "NO", may proceed with cephalosporin use.    Sulfa Antibiotics Swelling    Medications Reviewed Today   Medications were not reviewed in this encounter     Patient Active Problem List   Diagnosis Date Noted   COPD with acute exacerbation (HCC) 06/10/2023   OSA on CPAP 12/10/2022   Thoracic and lumbosacral neuritis 05/11/2021   Vitamin D deficiency 02/05/2021   Unstable angina (HCC) 03/30/2019   GERD (gastroesophageal reflux disease) 02/04/2019   HLD (hyperlipidemia) 02/04/2019   HTN (hypertension) 02/04/2019    Conditions to be addressed/monitored per PCP order:   community resources  There are no care plans that you recently modified to display for this patient.   Follow up:  Patient agrees to Care Plan and Follow-up.  Plan: The Managed Medicaid care management team will reach out to the patient again over the next 30 days.  Date/time of next scheduled Social Work care management/care coordination outreach:  08/04/23  Thersia Hoar, HEDWIG, [facility]  Managed [facility] Social Worker [phone]

## 2023-07-03 NOTE — Patient Instructions (Signed)
 Visit Information  Ms. Donofrio was given information about Medicaid Managed Care team care coordination services as a part of their [facility] Community Plan Medicaid benefit. Jon Chance Wissing verbally consented to engagement with the [facility] Managed Care team.   If you are experiencing a medical emergency, please call 911 or report to your local emergency department or urgent care.   If you have a non-emergency medical problem during routine business hours, please contact your provider's office and ask to speak with a nurse.   For questions related to your [facility], please call: [phone] or visit the homepage here: [email or web address]  If you would like to schedule transportation through your [facility], please call the following number at least 2 days in advance of your appointment: [phone]   Rides for urgent appointments can also be made after hours by calling Member Services.  Call the Behavioral Health Crisis Line at [phone], at any time, 24 hours a day, 7 days a week. If you are in danger or need immediate medical attention call 911.  If you would like help to quit smoking, call 1-800-QUIT-NOW ((303)713-6558) OR Espaol: 1-855-Djelo-Ya (8-144-664-6430) o para ms informacin haga clic aqu or Text READY to 799-599 to register via text  Ms. Fedorchak - following are the goals we discussed in your visit today:   Goals Addressed   None       Social Worker will follow up in 30 days.   Thersia Hoar, HEDWIG, MHA [facility] Health  Managed Medicaid Social Worker [phone]   Following is a copy of your plan of care:  There are no care plans that you recently modified to display for this patient.

## 2023-07-07 ENCOUNTER — Ambulatory Visit: Payer: Medicaid Other

## 2023-07-07 ENCOUNTER — Other Ambulatory Visit: Payer: Self-pay | Admitting: Family Medicine

## 2023-07-07 ENCOUNTER — Inpatient Hospital Stay: Payer: Medicaid Other | Admitting: Family Medicine

## 2023-07-07 DIAGNOSIS — J4541 Moderate persistent asthma with (acute) exacerbation: Secondary | ICD-10-CM

## 2023-07-14 ENCOUNTER — Inpatient Hospital Stay: Payer: Medicaid Other | Admitting: Family Medicine

## 2023-07-15 ENCOUNTER — Ambulatory Visit (HOSPITAL_COMMUNITY): Payer: Self-pay | Admitting: Mental Health

## 2023-07-16 ENCOUNTER — Telehealth (HOSPITAL_COMMUNITY): Payer: Self-pay | Admitting: Psychiatry

## 2023-07-16 ENCOUNTER — Inpatient Hospital Stay: Payer: Medicaid Other | Admitting: Family Medicine

## 2023-07-21 ENCOUNTER — Other Ambulatory Visit: Payer: Self-pay | Admitting: Family Medicine

## 2023-07-22 ENCOUNTER — Other Ambulatory Visit: Payer: Self-pay | Admitting: Family Medicine

## 2023-07-22 NOTE — Telephone Encounter (Signed)
Copied from CRM [id number]. Topic: Clinical - Medication Refill >> Jul 22, 2023  4:54 PM Elle L wrote: Most Recent Primary Care Visit:  Provider: Suzan Slick  Department: [facility]  Visit Type: MWNUUVO VIDEO VISIT  Date: 05/23/2023  Medication: spironolactone-hydrochlorothiazide (ALDACTAZIDE) 25-25 MG tablet  Has the patient contacted their pharmacy? Yes   Is this the correct pharmacy for this prescription? Yes If no, delete pharmacy and type the correct one.  This is the patient's preferred pharmacy:  [facility] [phone] G[place] Otto, [place] - [address] [place] [address] Phone: [phone] Fax: [phone]   Has the prescription been filled recently? No  Is the patient out of the medication? Yes  Has the patient been seen for an appointment in the last year OR does the patient have an upcoming appointment? Yes  Can we respond through MyChart? Yes  Agent: Please be advised that Rx refills may take up to 3 business days. We ask that you follow-up with your pharmacy.

## 2023-07-27 NOTE — Progress Notes (Unsigned)
Psychiatric Initial Adult Assessment  Patient Identification: Judy Baker MRN:  [id number] Date of Evaluation:  07/27/2023 Referral Source: ***Sundra Aland MD  Assessment:  Judy Baker is a 53 y.o. female with a history of *** depression, HTN, HLD, asthma, COPD, osteoarthritis, and OSA who presents to [facility] via video conferencing for initial evaluation of ***.  Patient reports ***  Plan:  # *** Past medication trials:  Status of problem: *** Interventions: -- ***  # *** Past medication trials:  Status of problem: *** Interventions: -- ***  # *** Past medication trials:  Status of problem: *** Interventions: -- ***  Patient was given contact information for behavioral health clinic and was instructed to call 911 for emergencies.   Subjective:  Chief Complaint: No chief complaint on file.   History of Present Illness:  ***  Chart review: -- Referred by PCP Oct 2024 for depression with anxiety -- Home psychotropics: Cymbalta 60 mg daily Buspar 10 mg BID   Ambien? On opioids?  Osa, cpap   Past Psychiatric History:  Diagnoses: *** Medication trials: *** Previous psychiatrist/therapist: *** Hospitalizations: *** Suicide attempts: *** SIB: *** Hx of violence towards others: *** Current access to guns: *** Hx of trauma/abuse: ***  Previous Psychotropic Medications: {YES/NO:21197}  Substance Abuse History in the last 12 months:  {yes no:314532}  Past Medical History:  Past Medical History:  Diagnosis Date   Arthritis    Asthma    Carpal tunnel syndrome    GERD (gastroesophageal reflux disease) 02/04/2019   HLD (hyperlipidemia) 02/04/2019   HTN (hypertension) 02/04/2019   Hypertension    Osteoarthritis     Past Surgical History:  Procedure Laterality Date   BREAST BIOPSY Right 12/21/2018   [facility]   BREAST SURGERY     CARPAL TUNNEL RELEASE     CHOLECYSTECTOMY     LEFT HEART CATH AND  CORONARY ANGIOGRAPHY N/A 03/30/2019   Procedure: LEFT HEART CATH AND CORONARY ANGIOGRAPHY;  Surgeon: Yates Decamp, MD;  Location: [facility] INVASIVE CV LAB;  Service: Cardiovascular;  Laterality: N/A;   LEFT HEART CATH AND CORONARY ANGIOGRAPHY N/A 02/21/2021   Procedure: LEFT HEART CATH AND CORONARY ANGIOGRAPHY;  Surgeon: Elder Negus, MD;  Location: [facility] INVASIVE CV LAB;  Service: Cardiovascular;  Laterality: N/A;   TUBAL LIGATION      Family Psychiatric History: ***  Family History:  Family History  Problem Relation Age of Onset   Aneurysm Mother    Valvular heart disease Mother    Stroke Father    Heart murmur Sister    Cardiomyopathy Brother     Social History:   Academic/Vocational: ***  Social History   Socioeconomic History   Marital status: Single    Spouse name: Not on file   Number of children: 2   Years of education: Not on file   Highest education level: Associate degree: occupational, Scientist, product/process development, or vocational program  Occupational History   Not on file  Tobacco Use   Smoking status: Every Day    Current packs/day: 0.25    Average packs/day: 0.3 packs/day for 20.0 years (5.0 ttl pk-yrs)    Types: Cigarettes    Passive exposure: Current   Smokeless tobacco: Never   Tobacco comments:    3 cigs a day    Trying to stop smoking  Vaping Use   Vaping status: Never Used  Substance and Sexual Activity   Alcohol use: Yes    Comment: occasionally   Drug use: Never  Sexual activity: Not Currently  Other Topics Concern   Not on file  Social History Narrative   ** Merged History Encounter **       Social Drivers of Health   Financial Resource Strain: High Risk (04/17/2023)   Overall Financial Resource Strain (CARDIA)    Difficulty of Paying Living Expenses: Very hard  Food Insecurity: No Food Insecurity (06/10/2023)   Hunger Vital Sign    Worried About Running Out of Food in the Last Year: Never true    Ran Out of Food in the Last Year: Never true  Recent  Concern: Food Insecurity - Food Insecurity Present (04/13/2023)   Hunger Vital Sign    Worried About Running Out of Food in the Last Year: Sometimes true    Ran Out of Food in the Last Year: Sometimes true  Transportation Needs: Unmet Transportation Needs (06/10/2023)   PRAPARE - Administrator, Civil Service (Medical): Yes    Lack of Transportation (Non-Medical): Yes  Physical Activity: Insufficiently Active (04/13/2023)   Exercise Vital Sign    Days of Exercise per Week: 2 days    Minutes of Exercise per Session: 60 min  Stress: Stress Concern Present (04/13/2023)   [employer] of Occupational Health - Occupational Stress Questionnaire    Feeling of Stress : To some extent  Social Connections: Moderately Isolated (04/13/2023)   Social Connection and Isolation Panel [NHANES]    Frequency of Communication with Friends and Family: More than three times a week    Frequency of Social Gatherings with Friends and Family: Once a week    Attends Religious Services: 1 to 4 times per year    Active Member of [employer] or Organizations: No    Attends Engineer, structural: Not on file    Marital Status: Divorced    Additional Social History: updated  Allergies:   Allergies  Allergen Reactions   Molds & Smuts    Grass Pollen(K-O-R-T-Swt Vern)     Allscripts Description: Grass   Penicillins Hives and Swelling    Did it involve swelling of the face/tongue/throat, SOB, or low BP? Yes Did it involve sudden or severe rash/hives, skin peeling, or any reaction on the inside of your mouth or nose? Yes Did you need to seek medical attention at a hospital or doctor's office? Yes When did it last happen? childhood (Teenage) allergy  If all above answers are "NO", may proceed with cephalosporin use.    Sulfa Antibiotics Swelling    Current Medications: Current Outpatient Medications  Medication Sig Dispense Refill   acetaminophen (TYLENOL) 500 MG tablet Take 2 tablets  (1,000 mg total) by mouth every 6 (six) hours as needed. 30 tablet 0   albuterol (PROVENTIL) (2.5 MG/3ML) 0.083% nebulizer solution Inhale 3 mLs (2.5 mg total) into the lungs every 6 (six) hours as needed for wheezing or shortness of breath. 360 mL 1   albuterol (VENTOLIN HFA) 108 (90 Base) MCG/ACT inhaler Inhale 2 puffs into the lungs every 6 (six) hours as needed for wheezing or shortness of breath.     amLODipine (NORVASC) 5 MG tablet Take 5 mg by mouth at bedtime.     azelastine (ASTELIN) 0.1 % nasal spray Place 2 sprays into both nostrils 2 (two) times daily as needed (allergies).      budesonide-formoterol (SYMBICORT) 160-4.5 MCG/ACT inhaler Inhale 2 puffs into the lungs in the morning and at bedtime.     busPIRone (BUSPAR) 10 MG tablet Take 1 tablet (10  mg total) by mouth 2 (two) times daily. 60 tablet 3   cyclobenzaprine (FLEXERIL) 10 MG tablet Take 10 mg by mouth 2 (two) times daily as needed for muscle spasms. (Patient not taking: Reported on 06/10/2023)     DULoxetine (CYMBALTA) 60 MG capsule TAKE 1 CAPSULE BY MOUTH EVERY DAY 90 capsule 2   fluticasone (FLONASE) 50 MCG/ACT nasal spray Place 1 spray into both nostrils 2 (two) times daily as needed for allergies.      furosemide (LASIX) 20 MG tablet Take 20 mg by mouth daily as needed for fluid.     HYDROcodone-acetaminophen (NORCO) 10-325 MG tablet Take 1 tablet by mouth 2 (two) times daily as needed for severe pain (pain score 7-10).     labetalol (NORMODYNE) 100 MG tablet TAKE 1 TABLET TWICE A DAY (HOLD IF TOP BLOOD PRESSURE NUMBER IS LESS THAN 100 OR PULSE LESS THAN 60) 180 tablet 2   montelukast (SINGULAIR) 10 MG tablet Take 10 mg by mouth at bedtime.     Multiple Vitamins-Minerals (CENTRUM SILVER 50+WOMEN) TABS Take 1 tablet by mouth daily.     nicotine (NICODERM CQ - DOSED IN MG/24 HR) 7 mg/24hr patch Place 1 patch (7 mg total) onto the skin daily. 28 patch 3   nitroGLYCERIN (NITROSTAT) 0.4 MG SL tablet PLACE 1 TABLET UNDER THE TONGUE  EVERY 5 MINUTES AS NEEDED FOR CHEST PAIN. 25 tablet 1   omeprazole (PRILOSEC) 40 MG capsule Take 1 capsule (40 mg total) by mouth daily. 30 capsule 3   ondansetron (ZOFRAN-ODT) 4 MG disintegrating tablet Take 1 tablet (4 mg total) by mouth every 8 (eight) hours as needed for nausea or vomiting. 21 tablet 0   pravastatin (PRAVACHOL) 40 MG tablet Take 1 tablet (40 mg total) by mouth daily. 30 tablet 5   spironolactone-hydrochlorothiazide (ALDACTAZIDE) 25-25 MG tablet Take 1 tablet by mouth daily. Restarted on 01/31/20     tiZANidine (ZANAFLEX) 4 MG tablet Take 4 mg by mouth every 8 (eight) hours as needed for muscle spasms.     traMADol (ULTRAM) 50 MG tablet Take 50 mg by mouth every 12 (twelve) hours as needed for moderate pain (pain score 4-6).     zolpidem (AMBIEN) 10 MG tablet Take 10 mg by mouth at bedtime as needed for sleep.     No current facility-administered medications for this visit.    ROS: Review of Systems  Objective:  Psychiatric Specialty Exam: There were no vitals taken for this visit.There is no height or weight on file to calculate BMI.  General Appearance: {Appearance:22683}  Eye Contact:  {BHH EYE CONTACT:22684}  Speech:  {Speech:22685}  Volume:  {Volume (PAA):22686}  Mood:  {BHH MOOD:22306}  Affect:  {Affect (PAA):22687}  Thought Content: {Thought Content:22690}   Suicidal Thoughts:  {ST/HT (PAA):22692}  Homicidal Thoughts:  {ST/HT (PAA):22692}  Thought Process:  {Thought Process (PAA):22688}  Orientation:  {BHH ORIENTATION (PAA):22689}    Memory: Grossly intact ***  Judgment:  {Judgement (PAA):22694}  Insight:  {Insight (PAA):22695}  Concentration:  {Concentration:21399}  Recall:  not formally assessed ***  Fund of Knowledge: {BHH GOOD/FAIR/POOR:22877}  Language: {BHH GOOD/FAIR/POOR:22877}  Psychomotor Activity:  {Psychomotor (PAA):22696}  Akathisia:  {BHH YES OR NO:22294}  AIMS (if indicated): {Desc; done/not:10129}  Assets:  {Assets (PAA):22698}  ADL's:   {BHH UJW'J:19147}  Cognition: {chl bhh cognition:304700322}  Sleep:  {BHH GOOD/FAIR/POOR:22877}   PE: General: sits comfortably in view of camera; no acute distress *** Pulm: no increased work of breathing on room air *** MSK:  all extremity movements appear intact *** Neuro: no focal neurological deficits observed *** Gait & Station: unable to assess by video ***   Metabolic Disorder Labs: No results found for: "HGBA1C", "MPG" No results found for: "PROLACTIN" Lab Results  Component Value Date   CHOL 193 11/04/2019   TRIG 64 11/04/2019   HDL 67 11/04/2019   CHOLHDL 2.9 11/04/2019   LDLCALC 114 (H) 11/04/2019   Lab Results  Component Value Date   TSH 1.140 11/04/2019    Therapeutic Level Labs: No results found for: "LITHIUM" No results found for: "CBMZ" No results found for: "VALPROATE"  Screenings:  GAD-7    Flowsheet Row Office Visit from 03/13/2023 in [facility] at [facility]  Total GAD-7 Score 11      PHQ2-9    Flowsheet Row Office Visit from 03/13/2023 in [facility] at [facility]  PHQ-2 Total Score 4  PHQ-9 Total Score 10      Flowsheet Row ED to Hosp-Admission (Discharged) from 06/10/2023 in [facility] ED from 10/11/2022 in [facility] at [facility] ED from 11/26/2021 in [facility] at [facility] RISK CATEGORY No Risk No Risk No Risk       Collaboration of Care: Collaboration of Care: [facility] OP Collaboration of QMVH:[id number]}  Patient/Guardian was advised Release of Information must be obtained prior to any record release in order to collaborate their care with an outside provider. Patient/Guardian was advised if they have not already done so to contact the registration department to sign all necessary forms in order for [place] to release information regarding their care.   Consent: Patient/Guardian gives verbal consent for treatment and  assignment of benefits for services provided during this visit. Patient/Guardian expressed understanding and agreed to proceed.   Televisit via video: I connected with [name] on 07/27/23 at  1:00 PM EST by a video enabled telemedicine application and verified that I am speaking with the correct person using two identifiers.  Location: Patient: ***home address in [place] Provider: remote office in [place]   I discussed the limitations of evaluation and management by telemedicine and the availability of in person appointments. The patient expressed understanding and agreed to proceed.  I discussed the assessment and treatment plan with the patient. The patient was provided an opportunity to ask questions and all were answered. The patient agreed with the plan and demonstrated an understanding of the instructions.   The patient was advised to call back or seek an in-person evaluation if the symptoms worsen or if the condition fails to improve as anticipated.  I provided *** minutes dedicated to the care of this patient via video on the date of this encounter to include chart review, face-to-face time with the patient, medication management/counseling ***.  Buren Havey A Dhruvi Crenshaw 1/26/20255:16 PM

## 2023-07-28 ENCOUNTER — Encounter (HOSPITAL_COMMUNITY): Payer: Medicaid Other | Admitting: Psychiatry

## 2023-07-28 ENCOUNTER — Encounter (HOSPITAL_COMMUNITY): Payer: Self-pay

## 2023-07-30 ENCOUNTER — Encounter: Payer: Self-pay | Admitting: Family Medicine

## 2023-07-30 ENCOUNTER — Ambulatory Visit (INDEPENDENT_AMBULATORY_CARE_PROVIDER_SITE_OTHER): Payer: Medicaid Other | Admitting: Family Medicine

## 2023-07-30 VITALS — BP 180/85 | HR 87 | Resp 18 | Wt 362.8 lb

## 2023-07-30 DIAGNOSIS — R0602 Shortness of breath: Secondary | ICD-10-CM | POA: Diagnosis not present

## 2023-07-30 DIAGNOSIS — F418 Other specified anxiety disorders: Secondary | ICD-10-CM

## 2023-07-30 DIAGNOSIS — I1A0 Resistant hypertension: Secondary | ICD-10-CM

## 2023-07-30 DIAGNOSIS — E782 Mixed hyperlipidemia: Secondary | ICD-10-CM

## 2023-07-30 DIAGNOSIS — J42 Unspecified chronic bronchitis: Secondary | ICD-10-CM

## 2023-07-30 DIAGNOSIS — F17210 Nicotine dependence, cigarettes, uncomplicated: Secondary | ICD-10-CM

## 2023-07-30 MED ORDER — AMLODIPINE BESYLATE 5 MG PO TABS: 5.0000 mg | ORAL_TABLET | Freq: Every evening | ORAL | 1 refills | Status: AC

## 2023-07-30 MED ORDER — PRAVASTATIN SODIUM 40 MG PO TABS: 40.0000 mg | ORAL_TABLET | Freq: Every day | ORAL | 1 refills | Status: AC

## 2023-07-30 MED ORDER — FUROSEMIDE 20 MG PO TABS: 20.0000 mg | ORAL_TABLET | Freq: Every day | ORAL | 1 refills | Status: AC | PRN

## 2023-07-30 MED ORDER — BUSPIRONE HCL 10 MG PO TABS: 10.0000 mg | ORAL_TABLET | Freq: Two times a day (BID) | ORAL | 1 refills | Status: AC

## 2023-07-30 MED ORDER — SPIRONOLACTONE-HCTZ 25-25 MG PO TABS: 1.0000 | ORAL_TABLET | Freq: Every day | ORAL | 1 refills | Status: AC

## 2023-07-30 MED ORDER — LABETALOL HCL 100 MG PO TABS: 100.0000 mg | ORAL_TABLET | Freq: Two times a day (BID) | ORAL | 1 refills | Status: AC

## 2023-07-30 NOTE — Progress Notes (Signed)
Established Patient Office Visit  Subjective   Patient ID: Judy Baker, female    DOB: Jul 06, 1970  Age: 53 y.o. MRN: [id number]  Chief Complaint  Patient presents with   Shortness of Breath    Shortness of Breath Associated symptoms include leg swelling.   SOB Pt with hx of COPD and recent hospital stay in December for COPD exacerbation. She had labs done and ECHO which was normal. She was treated with steroids during her hospital stay along with breathing treatments. Pt continues to smoke daily but reports only smoking 2-3 cigs a day. She has an appt with pulmonologist to establish care on 08/06/23. Pt reports she has nicotine patches at home but not using them.  Hypertension She has been out of her bp medicines for a week. Needs them refilled today. Not checking bp at home. She is taking Amlodipine 5mg , Lasix 20mg  daily prn, Labetalol 100mg  BID, Aldactazide 25/25mg  daily.  Depression/anxiety Needing refills on her Buspirone 10 mg BID.  Pt needs refills on her Pravastatin 40mg  daily for HLD.  Review of Systems  Respiratory:  Positive for shortness of breath.   Cardiovascular:  Positive for leg swelling.  All other systems reviewed and are negative.    Objective:     BP (!) 180/85   Pulse 87   Resp 18   Wt (!) 362 lb 12.8 oz (164.6 kg)   SpO2 100%   BMI 56.82 kg/m  BP Readings from Last 3 Encounters:  07/30/23 (!) 180/85  06/18/23 (!) 157/74  04/17/23 135/78      Physical Exam Vitals and nursing note reviewed.  Constitutional:      Appearance: Normal appearance. She is obese.  HENT:     Head: Normocephalic and atraumatic.     Right Ear: External ear normal.     Left Ear: External ear normal.     Nose: Nose normal.     Mouth/Throat:     Mouth: Mucous membranes are moist.     Pharynx: Oropharynx is clear.  Eyes:     Conjunctiva/sclera: Conjunctivae normal.     Pupils: Pupils are equal, round, and reactive to light.  Cardiovascular:     Rate and  Rhythm: Normal rate and regular rhythm.     Pulses: Normal pulses.     Heart sounds: Normal heart sounds.  Pulmonary:     Effort: Pulmonary effort is normal.     Breath sounds: Wheezing present.  Skin:    General: Skin is warm.     Capillary Refill: Capillary refill takes less than 2 seconds.  Neurological:     General: No focal deficit present.     Mental Status: She is alert and oriented to person, place, and time. Mental status is at baseline.  Psychiatric:        Mood and Affect: Mood normal.        Behavior: Behavior normal.        Thought Content: Thought content normal.        Judgment: Judgment normal.    No results found for any visits on 07/30/23.     The 10-year ASCVD risk score (Arnett DK, et al., 2019) is: 24.4%    Assessment & Plan:   Problem List Items Addressed This Visit   None SOB (shortness of breath) -     Brain natriuretic peptide -     Basic metabolic panel  Chronic bronchitis, unspecified chronic bronchitis type (HCC)  Resistant hypertension -  Spironolactone-HCTZ; Take 1 tablet by mouth daily. Restarted on 01/31/20  Dispense: 90 tablet; Refill: 1 -     Labetalol HCl; Take 1 tablet (100 mg total) by mouth 2 (two) times daily.  Dispense: 180 tablet; Refill: 1 -     Furosemide; Take 1 tablet (20 mg total) by mouth daily as needed for fluid.  Dispense: 90 tablet; Refill: 1 -     amLODIPine Besylate; Take 1 tablet (5 mg total) by mouth at bedtime.  Dispense: 90 tablet; Refill: 1 -     Brain natriuretic peptide -     Basic metabolic panel  Depression with anxiety -     busPIRone HCl; Take 1 tablet (10 mg total) by mouth 2 (two) times daily.  Dispense: 180 tablet; Refill: 1  Mixed hyperlipidemia -     Pravastatin Sodium; Take 1 tablet (40 mg total) by mouth daily.  Dispense: 90 tablet; Refill: 1  Cigarette nicotine dependence without complication   Pt with SOB with weight gain, could be from fluid retention as she's been out of her blood pressure  medicine for a week, consisting of Aldactazide. Refilled today and will recheck on Monday. Check BNP and BMP today. Refilled Buspirone 10mg  BID Refilled Pravastatin 40mg  for HLD. Advised and counseled on smoking cessation today to help prevent COPD exacerbations.   No follow-ups on file.    Suzan Slick, MD

## 2023-07-31 LAB — BASIC METABOLIC PANEL
BUN/Creatinine Ratio: 31 — ABNORMAL HIGH (ref 9–23)
BUN: 19 mg/dL (ref 6–24)
CO2: 21 mmol/L (ref 20–29)
Calcium: 9.6 mg/dL (ref 8.7–10.2)
Chloride: 107 mmol/L — ABNORMAL HIGH (ref 96–106)
Creatinine, Ser: 0.62 mg/dL (ref 0.57–1.00)
Glucose: 87 mg/dL (ref 70–99)
Potassium: 3.9 mmol/L (ref 3.5–5.2)
Sodium: 143 mmol/L (ref 134–144)
eGFR: 106 mL/min/{1.73_m2} (ref >59)

## 2023-07-31 LAB — BRAIN NATRIURETIC PEPTIDE: BNP: 54.5 pg/mL (ref 0.0–100.0)

## 2023-08-01 ENCOUNTER — Encounter: Payer: Self-pay | Admitting: Family Medicine

## 2023-08-04 ENCOUNTER — Other Ambulatory Visit: Payer: Self-pay

## 2023-08-04 ENCOUNTER — Ambulatory Visit: Payer: Medicaid Other | Admitting: Family Medicine

## 2023-08-04 NOTE — Patient Instructions (Signed)
Visit Information  Judy Baker was given information about Medicaid Managed Care team care coordination services as a part of their [facility] Community Plan Medicaid benefit. Judy Baker verbally consented to engagement with the [facility] Managed Care team.   If you are experiencing a medical emergency, please call 911 or report to your local emergency department or urgent care.   If you have a non-emergency medical problem during routine business hours, please contact your provider's office and ask to speak with a nurse.   For questions related to your [facility], please call: [phone] or visit the homepage here: [email or web address]  If you would like to schedule transportation through your [facility], please call the following number at least 2 days in advance of your appointment: [phone]   Rides for urgent appointments can also be made after hours by calling Member Services.  Call the Behavioral Health Crisis Line at [phone], at any time, 24 hours a day, 7 days a week. If you are in danger or need immediate medical attention call 911.  If you would like help to quit smoking, call 1-800-QUIT-NOW (414 359 5209) OR Espaol: 1-855-Djelo-Ya (1-324-401-0272) o para ms informacin haga clic aqu or Text READY to 536-644 to register via text  Ms. Sheller - following are the goals we discussed in your visit today:   Goals Addressed   None      The  Patient                                              has been provided with contact information for the Managed Medicaid care management team and has been advised to call with any health related questions or concerns.   Gus Puma, Kenard Gower, MHA [facility] Health  Managed Medicaid Social Worker [phone]   Following is a copy of your plan of care:  There are no care plans that you recently modified to display  for this patient.

## 2023-08-04 NOTE — Patient Outreach (Signed)
  Medicaid Managed Care Social Work Note  08/04/2023 Name:  Judy Baker MRN:  [id number] DOB:  [birth year]  Judy Baker is an 53 y.o. year old female who is a primary patient of Rucker, Magdalen Spatz, MD.  The Medicaid Managed Care Coordination team was consulted for assistance with:  Community Resources   [name] was given information about Medicaid Managed Care Coordination team services today. Judy Baker Patient agreed to services and verbal consent obtained.  Engaged with patient  for by telephone forfollow up visit in response to referral for case management and/or care coordination services.   Patient is participating in a Managed Medicaid Plan:  Yes  Assessments/Interventions:  Review of past medical history, allergies, medications, health status, including review of consultants reports, laboratory and other test data, was performed as part of comprehensive evaluation and provision of chronic care management services.  SDOH: (Social Drivers of Health) assessments and interventions performed: SDOH Interventions    Flowsheet Row ED to Hosp-Admission (Discharged) from 06/10/2023 in [facility] Office Visit from 04/17/2023 in [facility] at [facility] Visit from 03/13/2023 in [facility] at [facility]  SDOH Interventions     Transportation Interventions Inpatient TOC, Other (Comment)  [Resource placed on AVS] AMB Referral --  Depression Interventions/Treatment  -- -- Referral to Psychiatry  Financial Strain Interventions -- [id number] Referral --     BSW completed a telephone outreach with patient, she states she continues to contact resources BSW provided but they do not have any funds. She did contact DSS and they do not assist with reconnection fees.  Advanced Directives Status:  Not addressed in this encounter.  Care Plan                 Allergies  Allergen Reactions    Molds & Smuts    Grass Pollen([id number] Vern)     Allscripts Description: Grass   Penicillins Hives and Swelling    Did it involve swelling of the face/tongue/throat, SOB, or low BP? Yes Did it involve sudden or severe rash/hives, skin peeling, or any reaction on the inside of your mouth or nose? Yes Did you need to seek medical attention at a hospital or doctor's office? Yes When did it last happen? childhood (Teenage) allergy  If all above answers are "NO", may proceed with cephalosporin use.    Sulfa Antibiotics Swelling    Medications Reviewed Today   Medications were not reviewed in this encounter     Patient Active Problem List   Diagnosis Date Noted   COPD with acute exacerbation (HCC) 06/10/2023   OSA on CPAP 12/10/2022   Thoracic and lumbosacral neuritis 05/11/2021   Vitamin D deficiency 02/05/2021   Unstable angina (HCC) 03/30/2019   GERD (gastroesophageal reflux disease) 02/04/2019   HLD (hyperlipidemia) 02/04/2019   HTN (hypertension) 02/04/2019    Conditions to be addressed/monitored per PCP order:   community resources  There are no care plans that you recently modified to display for this patient.   Follow up:  Patient agrees to Care Plan and Follow-up.  Plan: The  Patient has been provided with contact information for the Managed Medicaid care management team and has been advised to call with any health related questions or concerns.   Abelino Derrick, MHA [facility]  Managed [facility] Social Worker [phone]

## 2023-08-06 ENCOUNTER — Inpatient Hospital Stay (HOSPITAL_BASED_OUTPATIENT_CLINIC_OR_DEPARTMENT_OTHER): Payer: Medicaid Other | Admitting: Pulmonary Disease

## 2023-08-07 ENCOUNTER — Ambulatory Visit: Payer: Medicaid Other | Admitting: Family Medicine

## 2023-08-07 ENCOUNTER — Telehealth: Payer: Medicaid Other | Admitting: Family Medicine

## 2023-08-07 VITALS — BP 130/72

## 2023-08-07 DIAGNOSIS — I1A0 Resistant hypertension: Secondary | ICD-10-CM | POA: Diagnosis not present

## 2023-08-07 DIAGNOSIS — R0602 Shortness of breath: Secondary | ICD-10-CM

## 2023-08-07 NOTE — Progress Notes (Signed)
 Virtual Visit via Video Note  I connected with Judy Baker on 08/07/23 at  3:10 PM EST by a video enabled telemedicine application and verified that I am speaking with the correct person using two identifiers.  Location: Patient: home in [place] Provider: primary care office in [place]   I discussed the limitations of evaluation and management by telemedicine and the availability of in person appointments. The patient expressed understanding and agreed to proceed.  History of Present Illness: Pt here via video visit as she couldn't make it into the office today. She was seen on 1/29 for worsening SOB and had uncontrolled blood pressure. She informed provider she was out of her medicines for the previous week. She had labs done at that visit with normal kidney function along with normal BNP reading. She was advised to follow up in 2 weeks. She is here today and reports she no longer has SOB. She says her fluid is gone but unsure of her current weight. She says she has checked her bp and it was 130/72.    Observations/Objective: Physical Exam Vitals reviewed.  Constitutional:      Appearance: Normal appearance. She is obese.  HENT:     Head: Normocephalic and atraumatic.     Nose: Nose normal.  Eyes:     Extraocular Movements: Extraocular movements intact.  Pulmonary:     Effort: Pulmonary effort is normal.  Neurological:     Mental Status: She is alert. Mental status is at baseline.  Psychiatric:        Mood and Affect: Mood normal.        Behavior: Behavior normal.        Thought Content: Thought content normal.        Judgment: Judgment normal.     Assessment and Plan: SOB (shortness of breath)  Resistant hypertension     Follow Up Instructions: Advised pt to continue taking chronic medicines. She was also advised to not run out of her medicines, to call the office for refilling before she does. She has also been referred to SW to help with  financial resources as this has been conflict with compliance of medicines. Will follow up in 2 months sooner prn.    I discussed the assessment and treatment plan with the patient. The patient was provided an opportunity to ask questions and all were answered. The patient agreed with the plan and demonstrated an understanding of the instructions.   The patient was advised to call back or seek an in-person evaluation if the symptoms worsen or if the condition fails to improve as anticipated.  I provided 5 minutes of non-face-to-face time during this encounter.   Torrence CINDERELLA Barrier, MD

## 2023-09-17 ENCOUNTER — Encounter (HOSPITAL_BASED_OUTPATIENT_CLINIC_OR_DEPARTMENT_OTHER): Payer: Self-pay

## 2023-09-30 ENCOUNTER — Inpatient Hospital Stay (HOSPITAL_BASED_OUTPATIENT_CLINIC_OR_DEPARTMENT_OTHER): Payer: Medicaid Other | Admitting: Pulmonary Disease

## 2023-10-02 ENCOUNTER — Telehealth: Payer: Self-pay

## 2023-10-02 NOTE — Telephone Encounter (Signed)
 Copied from CRM [id number]. Topic: General - Other >> Oct 02, 2023  1:27 PM Marland Kitchen D wrote: Will be sending papers from her property manager for her aide she needs a parking sticker so her aide doesn't get towed when she comes to help her

## 2023-10-23 ENCOUNTER — Encounter: Payer: Self-pay | Admitting: Family Medicine

## 2023-10-23 ENCOUNTER — Telehealth: Payer: Self-pay

## 2023-10-23 ENCOUNTER — Other Ambulatory Visit: Payer: Self-pay | Admitting: Family Medicine

## 2023-10-23 ENCOUNTER — Telehealth: Admitting: Family Medicine

## 2023-10-23 ENCOUNTER — Telehealth: Payer: Self-pay | Admitting: Family Medicine

## 2023-10-23 NOTE — Telephone Encounter (Signed)
 Copied from CRM [id number]. Topic: Appointments - Appointment Cancel/Reschedule >> Oct 23, 2023  3:25 PM Judy Baker wrote: Patient/patient representative is calling to cancel or reschedule an appointment. Refer to attachments for appointment information.

## 2023-10-23 NOTE — Telephone Encounter (Signed)
 Pt with no show/cancellations within 24 hours on the following dates: 03/10/23, 04/08/23, 04/14/23, 08/07/23, 10/23/23. Due to breach of no show policy, dismissing from practice.

## 2023-12-15 ENCOUNTER — Inpatient Hospital Stay (HOSPITAL_BASED_OUTPATIENT_CLINIC_OR_DEPARTMENT_OTHER): Admitting: Pulmonary Disease

## 2023-12-15 ENCOUNTER — Ambulatory Visit: Payer: Self-pay

## 2023-12-15 NOTE — Telephone Encounter (Signed)
 Copied from CRM [id number]. Topic: Clinical - Red Word Triage >> Dec 15, 2023  1:25 PM Judy Baker B wrote: [facility] that prompted transfer to Nurse Triage: High blood pressure, nauseated. Not feeling well.   ----------------------------------------------------------------------- From previous Reason for Contact - Pink Word Triage: High blood pressure, nauseated.   FYI Only or Action Required?: FYI only for provider  Patient was last seen in primary care on 08/07/2023 by Manette Section, MD. Called Nurse Triage reporting Hypertension. Symptoms began a week ago. Interventions attempted: Prescription medications: out of one of her BP medications. Symptoms are: gradually worsening.  Triage Disposition: Go to ED Now (Notify PCP)  Patient/caregiver understands and will follow disposition?: Yes   Reason for Disposition  [1] Systolic BP  >= 160 OR Diastolic >= 100 AND [2] cardiac (e.g., breathing difficulty, chest pain) or neurologic symptoms (e.g., new-onset blurred or double vision, unsteady gait)  Answer Assessment - Initial Assessment Questions 1. BLOOD PRESSURE: What is the blood pressure? Did you take at least two measurements 5 minutes apart?     180/107 2. ONSET: When did you take your blood pressure?     1 week  3. HOW: How did you take your blood pressure? (e.g., automatic home BP monitor, visiting nurse)     Automatic BP cuff  4. HISTORY: Do you have a history of high blood pressure?     Yes 5. MEDICINES: Are you taking any medicines for blood pressure? Have you missed any doses recently?     Out of Labetalol  for 1 week 6. OTHER SYMPTOMS: Do you have any symptoms? (e.g., blurred vision, chest pain, difficulty breathing, headache, weakness)     Headache, nausea  Protocols used: Blood Pressure - High-A-AH

## 2024-03-23 ENCOUNTER — Other Ambulatory Visit: Payer: Self-pay | Admitting: Family Medicine

## 2024-03-23 DIAGNOSIS — Z1231 Encounter for screening mammogram for malignant neoplasm of breast: Secondary | ICD-10-CM

## 2024-04-02 ENCOUNTER — Ambulatory Visit

## 2024-04-19 ENCOUNTER — Inpatient Hospital Stay: Admission: RE | Admit: 2024-04-19 | Source: Ambulatory Visit

## 2024-06-16 ENCOUNTER — Inpatient Hospital Stay: Admission: RE | Admit: 2024-06-16

## 2024-07-23 ENCOUNTER — Ambulatory Visit
